# Patient Record
Sex: Female | Born: 1960 | Race: Black or African American | Hispanic: No | Smoking: Never smoker
Health system: Southern US, Community
[De-identification: ages and names within clinical notes are randomized; demographics above are authoritative.]

## PROBLEM LIST (undated history)

## (undated) DIAGNOSIS — F419 Anxiety disorder, unspecified: Secondary | ICD-10-CM

## (undated) DIAGNOSIS — H209 Unspecified iridocyclitis: Secondary | ICD-10-CM

## (undated) DIAGNOSIS — I10 Essential (primary) hypertension: Secondary | ICD-10-CM

## (undated) DIAGNOSIS — R079 Chest pain, unspecified: Secondary | ICD-10-CM

## (undated) DIAGNOSIS — F32A Depression, unspecified: Secondary | ICD-10-CM

## (undated) DIAGNOSIS — M1990 Unspecified osteoarthritis, unspecified site: Secondary | ICD-10-CM

## (undated) DIAGNOSIS — T7840XA Allergy, unspecified, initial encounter: Secondary | ICD-10-CM

## (undated) DIAGNOSIS — N979 Female infertility, unspecified: Secondary | ICD-10-CM

## (undated) DIAGNOSIS — M5030 Other cervical disc degeneration, unspecified cervical region: Secondary | ICD-10-CM

## (undated) DIAGNOSIS — M069 Rheumatoid arthritis, unspecified: Secondary | ICD-10-CM

## (undated) HISTORY — DX: Unspecified osteoarthritis, unspecified site: M19.90

## (undated) HISTORY — DX: Anxiety disorder, unspecified: F41.9

## (undated) HISTORY — DX: Unspecified iridocyclitis: H20.9

## (undated) HISTORY — PX: UPPER GASTROINTESTINAL ENDOSCOPY: SHX188

## (undated) HISTORY — DX: Female infertility, unspecified: N97.9

## (undated) HISTORY — DX: Depression, unspecified: F32.A

## (undated) HISTORY — PX: COLONOSCOPY: SHX174

## (undated) HISTORY — PX: ABDOMINAL HYSTERECTOMY: SHX81

## (undated) HISTORY — DX: Other cervical disc degeneration, unspecified cervical region: M50.30

## (undated) HISTORY — DX: Chest pain, unspecified: R07.9

## (undated) HISTORY — DX: Allergy, unspecified, initial encounter: T78.40XA

## (undated) HISTORY — DX: Essential (primary) hypertension: I10

## (undated) HISTORY — DX: Rheumatoid arthritis, unspecified: M06.9

---

## 1998-11-17 ENCOUNTER — Other Ambulatory Visit: Admission: RE | Admit: 1998-11-17 | Discharge: 1998-11-17 | Payer: Self-pay | Admitting: Obstetrics and Gynecology

## 1999-08-30 ENCOUNTER — Ambulatory Visit (HOSPITAL_COMMUNITY): Admission: RE | Admit: 1999-08-30 | Discharge: 1999-08-30 | Payer: Self-pay | Admitting: Gastroenterology

## 1999-08-31 ENCOUNTER — Ambulatory Visit (HOSPITAL_COMMUNITY): Admission: RE | Admit: 1999-08-31 | Discharge: 1999-08-31 | Payer: Self-pay | Admitting: Gastroenterology

## 1999-08-31 ENCOUNTER — Encounter: Payer: Self-pay | Admitting: Gastroenterology

## 2000-01-22 ENCOUNTER — Other Ambulatory Visit: Admission: RE | Admit: 2000-01-22 | Discharge: 2000-01-22 | Payer: Self-pay | Admitting: Obstetrics and Gynecology

## 2000-01-26 ENCOUNTER — Encounter: Payer: Self-pay | Admitting: Obstetrics and Gynecology

## 2000-01-26 ENCOUNTER — Encounter: Admission: RE | Admit: 2000-01-26 | Discharge: 2000-01-26 | Payer: Self-pay | Admitting: Obstetrics and Gynecology

## 2000-06-10 ENCOUNTER — Encounter: Payer: Self-pay | Admitting: Internal Medicine

## 2000-06-10 ENCOUNTER — Ambulatory Visit (HOSPITAL_COMMUNITY): Admission: RE | Admit: 2000-06-10 | Discharge: 2000-06-10 | Payer: Self-pay | Admitting: Internal Medicine

## 2000-07-30 HISTORY — PX: CARPAL TUNNEL RELEASE: SHX101

## 2000-07-30 HISTORY — PX: THORACIC DISC SURGERY: SHX801

## 2000-08-22 ENCOUNTER — Emergency Department (HOSPITAL_COMMUNITY): Admission: EM | Admit: 2000-08-22 | Discharge: 2000-08-22 | Payer: Self-pay | Admitting: Emergency Medicine

## 2000-08-22 ENCOUNTER — Encounter: Payer: Self-pay | Admitting: Emergency Medicine

## 2000-10-06 ENCOUNTER — Ambulatory Visit (HOSPITAL_COMMUNITY): Admission: RE | Admit: 2000-10-06 | Discharge: 2000-10-06 | Payer: Self-pay | Admitting: Neurosurgery

## 2000-10-06 ENCOUNTER — Encounter: Payer: Self-pay | Admitting: Neurosurgery

## 2000-11-15 ENCOUNTER — Inpatient Hospital Stay (HOSPITAL_COMMUNITY): Admission: RE | Admit: 2000-11-15 | Discharge: 2000-11-17 | Payer: Self-pay | Admitting: Neurosurgery

## 2000-11-15 ENCOUNTER — Encounter: Payer: Self-pay | Admitting: Neurosurgery

## 2001-01-27 ENCOUNTER — Other Ambulatory Visit: Admission: RE | Admit: 2001-01-27 | Discharge: 2001-01-27 | Payer: Self-pay | Admitting: Obstetrics and Gynecology

## 2001-02-11 ENCOUNTER — Encounter: Payer: Self-pay | Admitting: Obstetrics and Gynecology

## 2001-02-11 ENCOUNTER — Ambulatory Visit (HOSPITAL_COMMUNITY): Admission: RE | Admit: 2001-02-11 | Discharge: 2001-02-11 | Payer: Self-pay | Admitting: Obstetrics and Gynecology

## 2001-05-08 ENCOUNTER — Ambulatory Visit (HOSPITAL_COMMUNITY): Admission: RE | Admit: 2001-05-08 | Discharge: 2001-05-08 | Payer: Self-pay | Admitting: Neurosurgery

## 2001-05-08 ENCOUNTER — Encounter: Payer: Self-pay | Admitting: Neurosurgery

## 2001-05-22 ENCOUNTER — Encounter: Admission: RE | Admit: 2001-05-22 | Discharge: 2001-06-24 | Payer: Self-pay | Admitting: Neurosurgery

## 2002-01-19 ENCOUNTER — Encounter: Payer: Self-pay | Admitting: Neurosurgery

## 2002-01-19 ENCOUNTER — Encounter: Admission: RE | Admit: 2002-01-19 | Discharge: 2002-01-19 | Payer: Self-pay | Admitting: Neurosurgery

## 2002-02-02 ENCOUNTER — Encounter: Payer: Self-pay | Admitting: Neurosurgery

## 2002-02-02 ENCOUNTER — Encounter: Admission: RE | Admit: 2002-02-02 | Discharge: 2002-02-02 | Payer: Self-pay | Admitting: Neurosurgery

## 2002-07-14 ENCOUNTER — Ambulatory Visit (HOSPITAL_BASED_OUTPATIENT_CLINIC_OR_DEPARTMENT_OTHER): Admission: RE | Admit: 2002-07-14 | Discharge: 2002-07-14 | Payer: Self-pay | Admitting: Internal Medicine

## 2002-09-15 ENCOUNTER — Encounter: Payer: Self-pay | Admitting: Neurosurgery

## 2002-09-15 ENCOUNTER — Encounter: Admission: RE | Admit: 2002-09-15 | Discharge: 2002-09-15 | Payer: Self-pay | Admitting: Neurosurgery

## 2002-09-29 ENCOUNTER — Encounter: Admission: RE | Admit: 2002-09-29 | Discharge: 2002-09-29 | Payer: Self-pay | Admitting: Neurosurgery

## 2002-09-29 ENCOUNTER — Encounter: Payer: Self-pay | Admitting: Neurosurgery

## 2003-03-18 ENCOUNTER — Encounter: Payer: Self-pay | Admitting: Rheumatology

## 2003-03-18 ENCOUNTER — Encounter: Admission: RE | Admit: 2003-03-18 | Discharge: 2003-03-18 | Payer: Self-pay | Admitting: Rheumatology

## 2003-12-30 ENCOUNTER — Other Ambulatory Visit: Admission: RE | Admit: 2003-12-30 | Discharge: 2003-12-30 | Payer: Self-pay | Admitting: Obstetrics and Gynecology

## 2004-01-05 ENCOUNTER — Ambulatory Visit (HOSPITAL_COMMUNITY): Admission: RE | Admit: 2004-01-05 | Discharge: 2004-01-05 | Payer: Self-pay | Admitting: General Surgery

## 2004-05-11 ENCOUNTER — Encounter: Admission: RE | Admit: 2004-05-11 | Discharge: 2004-05-11 | Payer: Self-pay | Admitting: Neurosurgery

## 2004-05-25 ENCOUNTER — Encounter: Admission: RE | Admit: 2004-05-25 | Discharge: 2004-05-25 | Payer: Self-pay | Admitting: Neurosurgery

## 2004-06-19 ENCOUNTER — Encounter: Admission: RE | Admit: 2004-06-19 | Discharge: 2004-06-19 | Payer: Self-pay | Admitting: Neurosurgery

## 2004-07-30 HISTORY — PX: LUMBAR DISC SURGERY: SHX700

## 2004-12-20 ENCOUNTER — Ambulatory Visit (HOSPITAL_COMMUNITY): Admission: RE | Admit: 2004-12-20 | Discharge: 2004-12-20 | Payer: Self-pay | Admitting: Gynecology

## 2005-01-09 ENCOUNTER — Ambulatory Visit (HOSPITAL_COMMUNITY): Admission: RE | Admit: 2005-01-09 | Discharge: 2005-01-09 | Payer: Self-pay | Admitting: Internal Medicine

## 2005-01-31 ENCOUNTER — Encounter: Admission: RE | Admit: 2005-01-31 | Discharge: 2005-01-31 | Payer: Self-pay | Admitting: Neurosurgery

## 2005-02-20 ENCOUNTER — Encounter: Admission: RE | Admit: 2005-02-20 | Discharge: 2005-02-20 | Payer: Self-pay | Admitting: Neurosurgery

## 2005-04-19 ENCOUNTER — Other Ambulatory Visit: Admission: RE | Admit: 2005-04-19 | Discharge: 2005-04-19 | Payer: Self-pay | Admitting: Obstetrics and Gynecology

## 2005-05-11 ENCOUNTER — Encounter: Admission: RE | Admit: 2005-05-11 | Discharge: 2005-05-11 | Payer: Self-pay | Admitting: Obstetrics and Gynecology

## 2005-05-23 ENCOUNTER — Ambulatory Visit (HOSPITAL_COMMUNITY): Admission: RE | Admit: 2005-05-23 | Discharge: 2005-05-24 | Payer: Self-pay | Admitting: Neurosurgery

## 2005-06-01 ENCOUNTER — Ambulatory Visit (HOSPITAL_COMMUNITY): Admission: RE | Admit: 2005-06-01 | Discharge: 2005-06-01 | Payer: Self-pay | Admitting: Neurological Surgery

## 2005-08-14 ENCOUNTER — Encounter: Admission: RE | Admit: 2005-08-14 | Discharge: 2005-08-14 | Payer: Self-pay | Admitting: Neurosurgery

## 2006-06-19 ENCOUNTER — Other Ambulatory Visit: Admission: RE | Admit: 2006-06-19 | Discharge: 2006-06-19 | Payer: Self-pay | Admitting: Obstetrics and Gynecology

## 2006-09-04 ENCOUNTER — Ambulatory Visit (HOSPITAL_COMMUNITY): Admission: RE | Admit: 2006-09-04 | Discharge: 2006-09-04 | Payer: Self-pay | Admitting: Internal Medicine

## 2006-09-04 ENCOUNTER — Ambulatory Visit: Payer: Self-pay | Admitting: Cardiology

## 2006-09-04 ENCOUNTER — Encounter: Payer: Self-pay | Admitting: Cardiology

## 2007-08-21 ENCOUNTER — Encounter: Admission: RE | Admit: 2007-08-21 | Discharge: 2007-08-21 | Payer: Self-pay | Admitting: Obstetrics and Gynecology

## 2007-10-30 ENCOUNTER — Ambulatory Visit: Admission: RE | Admit: 2007-10-30 | Discharge: 2007-10-30 | Payer: Self-pay | Admitting: Internal Medicine

## 2008-10-21 ENCOUNTER — Encounter: Admission: RE | Admit: 2008-10-21 | Discharge: 2008-10-21 | Payer: Self-pay | Admitting: Obstetrics and Gynecology

## 2009-07-30 HISTORY — PX: LUMBAR FUSION: SHX111

## 2010-01-18 ENCOUNTER — Inpatient Hospital Stay (HOSPITAL_COMMUNITY): Admission: RE | Admit: 2010-01-18 | Discharge: 2010-01-22 | Payer: Self-pay | Admitting: Neurosurgery

## 2010-01-20 ENCOUNTER — Encounter (INDEPENDENT_AMBULATORY_CARE_PROVIDER_SITE_OTHER): Payer: Self-pay | Admitting: Internal Medicine

## 2010-01-20 ENCOUNTER — Ambulatory Visit: Payer: Self-pay | Admitting: Internal Medicine

## 2010-08-07 ENCOUNTER — Ambulatory Visit (HOSPITAL_COMMUNITY)
Admission: RE | Admit: 2010-08-07 | Discharge: 2010-08-07 | Payer: Self-pay | Source: Home / Self Care | Attending: Internal Medicine | Admitting: Internal Medicine

## 2010-08-19 ENCOUNTER — Encounter: Payer: Self-pay | Admitting: Neurosurgery

## 2010-10-15 LAB — CULTURE, BLOOD (ROUTINE X 2)
Culture: NO GROWTH
Culture: NO GROWTH

## 2010-10-15 LAB — DIFFERENTIAL
Basophils Absolute: 0 10*3/uL (ref 0.0–0.1)
Basophils Relative: 0 % (ref 0–1)
Lymphocytes Relative: 7 % — ABNORMAL LOW (ref 12–46)
Neutro Abs: 16.2 10*3/uL — ABNORMAL HIGH (ref 1.7–7.7)
Neutrophils Relative %: 87 % — ABNORMAL HIGH (ref 43–77)

## 2010-10-15 LAB — BASIC METABOLIC PANEL
BUN: 10 mg/dL (ref 6–23)
CO2: 29 mEq/L (ref 19–32)
Calcium: 9.3 mg/dL (ref 8.4–10.5)
Chloride: 103 mEq/L (ref 96–112)
Creatinine, Ser: 0.8 mg/dL (ref 0.4–1.2)
GFR calc Af Amer: >60 mL/min (ref >60)
GFR calc non Af Amer: >60 mL/min (ref >60)
Glucose, Bld: 119 mg/dL — ABNORMAL HIGH (ref 70–99)
Potassium: 3.5 mEq/L (ref 3.5–5.1)
Sodium: 137 mEq/L (ref 135–145)

## 2010-10-15 LAB — COMPREHENSIVE METABOLIC PANEL
AST: 72 U/L — ABNORMAL HIGH (ref 0–37)
Albumin: 2.7 g/dL — ABNORMAL LOW (ref 3.5–5.2)
Alkaline Phosphatase: 44 U/L (ref 39–117)
BUN: 4 mg/dL — ABNORMAL LOW (ref 6–23)
Calcium: 8.4 mg/dL (ref 8.4–10.5)
Chloride: 101 mEq/L (ref 96–112)
Chloride: 105 mEq/L (ref 96–112)
Creatinine, Ser: 0.85 mg/dL (ref 0.4–1.2)
Creatinine, Ser: 0.9 mg/dL (ref 0.4–1.2)
GFR calc Af Amer: >60 mL/min (ref >60)
Glucose, Bld: 108 mg/dL — ABNORMAL HIGH (ref 70–99)
Potassium: 3.7 mEq/L (ref 3.5–5.1)
Total Bilirubin: 0.7 mg/dL (ref 0.3–1.2)
Total Protein: 6.4 g/dL (ref 6.0–8.3)

## 2010-10-15 LAB — SURGICAL PCR SCREEN: Staphylococcus aureus: NEGATIVE

## 2010-10-15 LAB — TYPE AND SCREEN

## 2010-10-15 LAB — CK TOTAL AND CKMB (NOT AT ARMC)
CK, MB: 18 ng/mL (ref 0.3–4.0)
CK, MB: 8.7 ng/mL (ref 0.3–4.0)
Relative Index: 0.2 (ref 0.0–2.5)
Relative Index: 0.5 (ref 0.0–2.5)
Total CK: 3331 U/L — ABNORMAL HIGH (ref 7–177)

## 2010-10-15 LAB — CBC
HCT: 30.3 % — ABNORMAL LOW (ref 36.0–46.0)
MCH: 33.6 pg (ref 26.0–34.0)
MCHC: 34.2 g/dL (ref 30.0–36.0)
MCHC: 34.5 g/dL (ref 30.0–36.0)
MCHC: 34.6 g/dL (ref 30.0–36.0)
MCV: 96.5 fL (ref 78.0–100.0)
MCV: 96.7 fL (ref 78.0–100.0)
Platelets: 181 10*3/uL (ref 150–400)
Platelets: 242 10*3/uL (ref 150–400)
Platelets: 263 10*3/uL (ref 150–400)
Platelets: 321 10*3/uL (ref 150–400)
RBC: 2.66 MIL/uL — ABNORMAL LOW (ref 3.87–5.11)
RDW: 12.9 % (ref 11.5–15.5)
RDW: 13 % (ref 11.5–15.5)
WBC: 15.1 10*3/uL — ABNORMAL HIGH (ref 4.0–10.5)
WBC: 18.5 10*3/uL — ABNORMAL HIGH (ref 4.0–10.5)

## 2010-10-15 LAB — URINALYSIS, ROUTINE W REFLEX MICROSCOPIC
Bilirubin Urine: NEGATIVE
Ketones, ur: NEGATIVE mg/dL
Protein, ur: NEGATIVE mg/dL
Specific Gravity, Urine: <1.005 — ABNORMAL LOW (ref 1.005–1.030)
Urobilinogen, UA: 1 mg/dL (ref 0.0–1.0)

## 2010-10-15 LAB — TROPONIN I
Troponin I: 0.02 ng/mL (ref 0.00–0.06)
Troponin I: 0.04 ng/mL (ref 0.00–0.06)

## 2010-10-15 LAB — D-DIMER, QUANTITATIVE: D-Dimer, Quant: 1.63 ug/mL-FEU — ABNORMAL HIGH (ref 0.00–0.48)

## 2010-10-15 LAB — URINE MICROSCOPIC-ADD ON

## 2010-10-15 LAB — ABO/RH: ABO/RH(D): AB POS

## 2010-10-30 ENCOUNTER — Other Ambulatory Visit: Payer: Self-pay | Admitting: Obstetrics and Gynecology

## 2010-10-30 DIAGNOSIS — Z1231 Encounter for screening mammogram for malignant neoplasm of breast: Secondary | ICD-10-CM

## 2010-11-15 ENCOUNTER — Ambulatory Visit
Admission: RE | Admit: 2010-11-15 | Discharge: 2010-11-15 | Disposition: A | Discharge status: Home / Self Care | Payer: BC Managed Care – PPO | Source: Ambulatory Visit | Attending: Obstetrics and Gynecology | Admitting: Obstetrics and Gynecology

## 2010-11-15 DIAGNOSIS — Z1231 Encounter for screening mammogram for malignant neoplasm of breast: Secondary | ICD-10-CM

## 2010-12-14 ENCOUNTER — Ambulatory Visit (AMBULATORY_SURGERY_CENTER): Payer: BC Managed Care – PPO | Admitting: *Deleted

## 2010-12-14 ENCOUNTER — Encounter: Payer: Self-pay | Admitting: Internal Medicine

## 2010-12-14 VITALS — Ht 64.0 in | Wt 168.0 lb

## 2010-12-14 DIAGNOSIS — Z800 Family history of malignant neoplasm of digestive organs: Secondary | ICD-10-CM

## 2010-12-14 DIAGNOSIS — Z1211 Encounter for screening for malignant neoplasm of colon: Secondary | ICD-10-CM

## 2010-12-14 MED ORDER — PEG-KCL-NACL-NASULF-NA ASC-C 100 G PO SOLR: ORAL | Status: DC

## 2010-12-14 NOTE — Progress Notes (Signed)
Request for record release given to [facility].  Pt states she had a colon a [facility] about 10 years ago.

## 2010-12-15 NOTE — Op Note (Signed)
NAMELOLLY, GLAUS NO.:  [id number]   MEDICAL RECORD NO.:  [id number]          PATIENT TYPE:  OIB   LOCATION:  3019                         FACILITY:  [facility]   PHYSICIAN:  Coletta Memos, M.D.     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  She presented with weakness in the  left L5 distribution.  She has difficulty with heel-walking and in her  extensor hallucis longus.  MRI was performed and it showed narrowing in the  lateral recess at L4-5.  I recommended and she agreed to undergo an  operative decompression.   PREOPERATIVE DIAGNOSES:  1.  Herniated nucleus pulposus, left L4-5.  2.  Left L5 radiculopathy.   POSTOPERATIVE DIAGNOSES:  1.  Herniated nucleus pulposus, left L4-5.  2.  Left L5 radiculopathy.   PROCEDURE:  Left L4-5 semi-hemilaminectomy and diskectomy with  microdissection.   COMPLICATIONS:  None.   SURGEON:  Coletta Memos, M.D.   ASSESSMENT:  Kari Williams, M.D.   ESTIMATED BLOOD LOSS:  Minimal.   ANESTHESIA:  General endotracheal.   OPERATIVE NOTE:  Mrs. Kari Williams was brought to the operating room,  intubated and placed under a general anesthetic without difficulty.  She was  positioned prone on a Wilson frame.  All pressure points were properly  padded.  Her back was prepped and she was draped in a sterile fashion.  Using a preoperative localizing film, I infiltrated 10 mL  of 0.5% lidocaine  and 1:200,000 epinephrine into the lumbar region.  I opened the skin with a  #10 blade and took my initial incision down through the subcutaneous fat to  the thoracolumbar fascia.  I then exposed the lamina of L4 and of L5.  I  took another x-ray where a double-ended ganglion knife had been placed  inferior to superior lamina to expose the operative  field.  I then,  confirming my location at L4-5, proceeded with a semi-hemilaminectomy at L4  and also removed some of the lamina of L5.  I retracted the thecal sac  medially and removed more bone laterally to create more room for the left L5  nerve root.  I then opened the disk space, which was quite prominent and  clearly causing pressure on the left L5 root, and performed a diskectomy  using pituitary rongeurs, Epstein curettes and Kerrison punches.  I then  performed a very generous foraminotomy over the L5 nerve  root.  With Dr. Temple Pacini assistance, I felt that the nerve root was well  decompressed.  I then irrigated the wound.  I then closed the wound in  layered fashion using Vicryl sutures to reapproximate the thoracolumbar  fascia, subcutaneous tissue and subcuticular layer.  Dermabond was used for  a sterile dressing.           ______________________________  Coletta Memos, M.D.     KC/MEDQ  D:  05/23/2005  T:  05/24/2005  Job:  [id number]

## 2010-12-28 ENCOUNTER — Other Ambulatory Visit: Payer: Self-pay | Admitting: Internal Medicine

## 2011-09-19 ENCOUNTER — Other Ambulatory Visit: Payer: Self-pay | Admitting: Obstetrics and Gynecology

## 2011-09-19 DIAGNOSIS — Z1231 Encounter for screening mammogram for malignant neoplasm of breast: Secondary | ICD-10-CM

## 2011-11-06 ENCOUNTER — Other Ambulatory Visit: Payer: Self-pay | Admitting: Internal Medicine

## 2011-11-06 ENCOUNTER — Ambulatory Visit (HOSPITAL_COMMUNITY)
Admission: RE | Admit: 2011-11-06 | Discharge: 2011-11-06 | Disposition: A | Discharge status: Home / Self Care | Payer: BC Managed Care – PPO | Source: Ambulatory Visit | Attending: Internal Medicine | Admitting: Internal Medicine

## 2011-11-06 DIAGNOSIS — R0789 Other chest pain: Secondary | ICD-10-CM

## 2011-11-06 DIAGNOSIS — R071 Chest pain on breathing: Secondary | ICD-10-CM | POA: Insufficient documentation

## 2011-11-19 ENCOUNTER — Ambulatory Visit
Admission: RE | Admit: 2011-11-19 | Discharge: 2011-11-19 | Disposition: A | Discharge status: Home / Self Care | Payer: BC Managed Care – PPO | Source: Ambulatory Visit | Attending: Obstetrics and Gynecology | Admitting: Obstetrics and Gynecology

## 2011-11-19 ENCOUNTER — Ambulatory Visit (INDEPENDENT_AMBULATORY_CARE_PROVIDER_SITE_OTHER): Payer: BC Managed Care – PPO | Admitting: Obstetrics and Gynecology

## 2011-11-19 ENCOUNTER — Encounter: Payer: Self-pay | Admitting: Obstetrics and Gynecology

## 2011-11-19 VITALS — BP 108/62 | Resp 16 | Ht 64.0 in | Wt 173.0 lb

## 2011-11-19 DIAGNOSIS — Z1231 Encounter for screening mammogram for malignant neoplasm of breast: Secondary | ICD-10-CM

## 2011-11-19 DIAGNOSIS — R1907 Generalized intra-abdominal and pelvic swelling, mass and lump: Secondary | ICD-10-CM | POA: Insufficient documentation

## 2011-11-19 DIAGNOSIS — A630 Anogenital (venereal) warts: Secondary | ICD-10-CM

## 2011-11-19 DIAGNOSIS — Z124 Encounter for screening for malignant neoplasm of cervix: Secondary | ICD-10-CM

## 2011-11-19 DIAGNOSIS — Z01419 Encounter for gynecological examination (general) (routine) without abnormal findings: Secondary | ICD-10-CM

## 2011-11-19 DIAGNOSIS — D219 Benign neoplasm of connective and other soft tissue, unspecified: Secondary | ICD-10-CM

## 2011-11-19 DIAGNOSIS — I10 Essential (primary) hypertension: Secondary | ICD-10-CM | POA: Insufficient documentation

## 2011-11-19 DIAGNOSIS — D259 Leiomyoma of uterus, unspecified: Secondary | ICD-10-CM

## 2011-11-19 DIAGNOSIS — Z6825 Body mass index (BMI) 25.0-25.9, adult: Secondary | ICD-10-CM

## 2011-11-19 DIAGNOSIS — E663 Overweight: Secondary | ICD-10-CM

## 2011-11-19 DIAGNOSIS — Z6826 Body mass index (BMI) 26.0-26.9, adult: Secondary | ICD-10-CM | POA: Insufficient documentation

## 2011-11-19 NOTE — Progress Notes (Signed)
Addended by: Tim Lair on: 11/19/2011 02:34 PM   Modules accepted: Orders

## 2011-11-19 NOTE — Progress Notes (Addendum)
Regular Periods: no Mammogram: Pt last mammogram was 10/2010 "WNL" pt has Mammogram scheduled today.   Monthly Breast Ex.: yes Exercise: yes  Tetanus < 10 years: no Seatbelts: yes  NI. Bladder Functn.: yes Abuse at home: no  Daily BM's: yes Stressful Work: yes  Healthy Diet: yes Sigmoid-Colonoscopy: "6 years ago " pt is going to schedule appt soon.  Calcium: no Medical problems this year: NONE   LAST PAP:10/2010 'WNL"  Pt wants pap today  Contraception: Vas  Mammogram: 11/15/2010 "WNL"  PCP: Wyline Mood  PMH: No Changes  FMH: No Changes  Subjective:    Kari Williams is a 51 y.o. female No obstetric history on file. who presents for annual exam.  The patient has no complaints today. Condylomata resolved.  The following portions of the patient's history were reviewed and updated as appropriate: allergies, current medications, past family history, past medical history, past social history, past surgical history and problem list.  Review of Systems Pertinent items are noted in HPI. Gastrointestinal:No change in bowel habits, no abdominal pain, no rectal bleeding Genitourinary:negative for dysuria, frequency, hematuria, nocturia and urinary incontinence    Objective:     BP 108/62  Resp 16  Ht 5\' 4"  (1.626 m)  Wt 173 lb (78.472 kg)  BMI 29.70 kg/m2  Weight:  Wt Readings from Last 1 Encounters:  11/19/11 173 lb (78.472 kg)     BMI: Body mass index is 29.70 kg/(m^2). General Appearance: Alert, appropriate appearance for age. No acute distress HEENT: Grossly normal Neck / Thyroid: Supple, no masses, nodes or enlargement Lungs: clear to auscultation bilaterally Back: No CVA tenderness Breast Exam: No dimpling, nipple retraction or discharge. No masses or nodes. and No masses or nodes.No dimpling, nipple retraction or discharge. Cardiovascular: Regular rate and rhythm. S1, S2, no murmur Gastrointestinal: Soft, non-tender, no masses or organomegaly Pelvic Exam:  External genitalia: normal general appearance Vaginal: normal mucosa without prolapse or lesions and cystocele present,  Cervix: normal appearance Adnexa: normal bimanual exam Uterus: irregular enlargement Rectovaginal: mass 3 cm(? Stool) Lymphatic Exam: Non-palpable nodes in neck, clavicular, axillary, or inguinal regions Skin: no rash or abnormalities Neurologic: Normal gait and speech, no tremor  Psychiatric: Alert and oriented, appropriate affect.    Urinalysis:Not done      Assessment:    Normal gyn exam Pelvic mass vs. stool in colon.  Resolved condylomata. Improved menopausal sx   Plan:    Mammogram. Pap smear. clean colon and repeat exam in one week.  Exercise and weight control discussed.  Follow-up:  1 week.  AVS

## 2011-11-20 LAB — PAP IG W/ RFLX HPV ASCU

## 2011-11-30 ENCOUNTER — Encounter: Payer: BC Managed Care – PPO | Admitting: Obstetrics and Gynecology

## 2012-01-01 ENCOUNTER — Encounter: Payer: Self-pay | Admitting: Obstetrics and Gynecology

## 2012-04-18 ENCOUNTER — Other Ambulatory Visit: Payer: Self-pay | Admitting: Neurosurgery

## 2012-04-18 DIAGNOSIS — K4090 Unilateral inguinal hernia, without obstruction or gangrene, not specified as recurrent: Secondary | ICD-10-CM

## 2012-04-23 ENCOUNTER — Ambulatory Visit
Admission: RE | Admit: 2012-04-23 | Discharge: 2012-04-23 | Disposition: A | Discharge status: Home / Self Care | Payer: BC Managed Care – PPO | Source: Ambulatory Visit | Attending: Neurosurgery | Admitting: Neurosurgery

## 2012-04-23 DIAGNOSIS — K4090 Unilateral inguinal hernia, without obstruction or gangrene, not specified as recurrent: Secondary | ICD-10-CM

## 2012-04-23 MED ORDER — IOHEXOL 300 MG/ML  SOLN
100.0000 mL | Freq: Once | INTRAMUSCULAR | Status: AC | PRN
  Administered 2012-04-23: 100 mL via INTRAVENOUS

## 2012-05-13 ENCOUNTER — Encounter: Payer: Self-pay | Admitting: Obstetrics and Gynecology

## 2012-05-13 ENCOUNTER — Ambulatory Visit (INDEPENDENT_AMBULATORY_CARE_PROVIDER_SITE_OTHER): Payer: BC Managed Care – PPO | Admitting: Obstetrics and Gynecology

## 2012-05-13 VITALS — BP 134/86 | Temp 98.5°F | Resp 18 | Wt 175.0 lb

## 2012-05-13 DIAGNOSIS — D259 Leiomyoma of uterus, unspecified: Secondary | ICD-10-CM

## 2012-05-13 DIAGNOSIS — M549 Dorsalgia, unspecified: Secondary | ICD-10-CM

## 2012-05-13 DIAGNOSIS — R109 Unspecified abdominal pain: Secondary | ICD-10-CM

## 2012-05-13 DIAGNOSIS — D219 Benign neoplasm of connective and other soft tissue, unspecified: Secondary | ICD-10-CM

## 2012-05-13 NOTE — Progress Notes (Signed)
HISTORY OF PRESENT ILLNESS  Ms. Kari Williams is a 51 y.o. year old female,G7P2, who presents for a problem visit. The patient has a history of lower back and abdominal pain.  She has a known history of fibroid uterus.  She is postmenopausal.  She has been seen by a neurosurgeon and a CT scan was ordered recently. Fibroids were noted and the uterus but no dimensions were given.   Subjective:  The patient complains of a constant pain in her right lower back and radiates to her right flank and into her right lower pelvic area.  Objective:  BP 134/86  Temp 98.5 F (36.9 C) (Oral)  Resp 18  Wt 175 lb (79.379 kg)   GI: soft and nontender  External genitalia: normal general appearance Vaginal: normal without tenderness, induration or masses and relaxation noted Cervix: normal appearance Adnexa: normal bimanual exam Uterus: a 10 week size, irregular, minimally tender  Assessment:  Fibroid uterus Lower back, right flank, and right lower pelvic pain of uncertain etiology Minimal disc disease in her back  Plan:  A long discussion was held with the patient and her husband.  They were told that I do not believe that her fibroids are causing her discomfort.  However, it is possible that the fibroids are the culprit.  The patient will speak with her neurosurgeon and her family physician about other possible etiologies and treatment.  If nothing else release her discomfort, then hysterectomy may be appropriate.  Other fibroid treatments were reviewed with the patient understands that she will still have fibroids.  Return to office prn if symptoms worsen or fail to improve.   Leonard Schwartz M.D.  05/13/2012 9:12 PM    Vag. Discharge:no Odor:no Fever:no Irreg.Periods:no Dyspareunia:no Dysuria:yes Frequency:no Urgency:no Hematuria:no Kidney stones:no Constipation:no Diarrhea:no Rectal Bleeding:  no Vomiting:no Nausea:no Pregnant:no Fibroids:yes Endometriosis:no Hx of Ovarian Cyst:no Hx IUD:yes Hx STD-PID:no Appendectomy:no Gall Bladder Dz:no

## 2012-06-03 ENCOUNTER — Telehealth: Payer: Self-pay | Admitting: Obstetrics and Gynecology

## 2012-06-23 ENCOUNTER — Encounter (HOSPITAL_COMMUNITY): Payer: Self-pay | Admitting: Pharmacist

## 2012-06-23 ENCOUNTER — Other Ambulatory Visit: Payer: Self-pay | Admitting: Obstetrics and Gynecology

## 2012-06-23 ENCOUNTER — Telehealth: Payer: Self-pay | Admitting: Obstetrics and Gynecology

## 2012-06-23 NOTE — Telephone Encounter (Signed)
Vaginal Hysterectomy scheduled for 07/02/12 @ 10:00 with AVS/SR.  [employer] effective 07/31/11.  Plan pays 100% after a $200 deducible. -Adrianne Pridgen

## 2012-06-25 ENCOUNTER — Other Ambulatory Visit: Payer: Self-pay

## 2012-06-25 ENCOUNTER — Encounter (HOSPITAL_COMMUNITY): Payer: Self-pay

## 2012-06-25 ENCOUNTER — Ambulatory Visit (INDEPENDENT_AMBULATORY_CARE_PROVIDER_SITE_OTHER): Payer: BC Managed Care – PPO | Admitting: Obstetrics and Gynecology

## 2012-06-25 ENCOUNTER — Encounter: Payer: Self-pay | Admitting: Obstetrics and Gynecology

## 2012-06-25 ENCOUNTER — Encounter (HOSPITAL_COMMUNITY)
Admission: RE | Admit: 2012-06-25 | Discharge: 2012-06-25 | Disposition: A | Discharge status: Home / Self Care | Payer: BC Managed Care – PPO | Source: Ambulatory Visit | Attending: Obstetrics and Gynecology | Admitting: Obstetrics and Gynecology

## 2012-06-25 VITALS — BP 106/70 | Wt 173.0 lb

## 2012-06-25 DIAGNOSIS — N9489 Other specified conditions associated with female genital organs and menstrual cycle: Secondary | ICD-10-CM

## 2012-06-25 DIAGNOSIS — D219 Benign neoplasm of connective and other soft tissue, unspecified: Secondary | ICD-10-CM

## 2012-06-25 DIAGNOSIS — D259 Leiomyoma of uterus, unspecified: Secondary | ICD-10-CM

## 2012-06-25 DIAGNOSIS — R102 Pelvic and perineal pain: Secondary | ICD-10-CM

## 2012-06-25 LAB — BASIC METABOLIC PANEL
Calcium: 9.1 mg/dL (ref 8.4–10.5)
GFR calc Af Amer: 87 mL/min — ABNORMAL LOW (ref >90)
GFR calc non Af Amer: 75 mL/min — ABNORMAL LOW (ref >90)
Sodium: 137 mEq/L (ref 135–145)

## 2012-06-25 LAB — CBC
Platelets: 345 10*3/uL (ref 150–400)
RBC: 3.9 MIL/uL (ref 3.87–5.11)
WBC: 8.3 10*3/uL (ref 4.0–10.5)

## 2012-06-25 LAB — SURGICAL PCR SCREEN: Staphylococcus aureus: NEGATIVE

## 2012-06-25 NOTE — Progress Notes (Signed)
HISTORY OF PRESENT ILLNESS  Ms. Kari Williams is a 51 y.o. year old female,G7P2, who presents for a problem visit. She has a known fibroid.  She wants to proceed with a vaginal hysterectomy.  Subjective:  She complains of pelvic pressure symptoms.  Objective:  BP 106/70  Wt 173 lb (78.472 kg)   General: no distress Resp: clear to auscultation bilaterally Cardio: regular rate and rhythm, S1, S2 normal, no murmur, click, rub or gallop GI: soft and nontender  External genitalia: normal general appearance Vaginal: normal without tenderness, induration or masses and relaxation noted Cervix: normal appearance Adnexa: normal bimanual exam Uterus: a 10 week size, irregular  ENDOMETRIAL BIOPSY PROCEDURE  The indications for the procedure were reviewed with the patient.  All her questions were answered.  A speculum exam was performed.  The cervix and vagina were prepped with multiple layers of Betadine.  Hurricaine gel was applied to the cervix.  A single-tooth tenaculum was required.  The uterus sounded to 7 centimeters.  An adequate sample was obtained.  The patient tolerated her procedure well.  The endometrial sample was sent to pathology.   Assessment:  Fibroid uterus  Pelvic pressure symptoms  Obesity  Hypertension  Plan:  Hysterectomy forms reviewed and signed.  Endometrial biopsy to pathology.  Return to office for ultrasound.  Return to office in 5 day(s).  Leonard Schwartz M.D.  06/25/2012 6:11 PM

## 2012-06-25 NOTE — Patient Instructions (Addendum)
   Your procedure is scheduled on:Wednesday December 4th  Enter through the [employer] [facility] at:8:30am Pick up the phone at the desk and dial [phone] and inform us of your arrival.  Please call this number if you have any problems the morning of surgery: [phone]  Remember: Do not eat or drink anything after midnight on Tuesday Please take your medications morning of surgery with sips of water  Do not wear jewelry, make-up, or FINGER nail polish No metal in your hair or on your body. Do not wear lotions, powders, perfumes. You may wear deodorant.  Please use your CHG wash as directed prior to surgery.  Do not shave anywhere for at least 12 hours prior to first CHG shower.  Do not bring valuables to the hospital. Contacts, dentures or bridgework may not be worn into surgery.  Leave suitcase in the car. After Surgery it may be brought to your room. For patients being admitted to the hospital, checkout time is 11:00am the day of discharge.

## 2012-06-30 ENCOUNTER — Encounter: Payer: Self-pay | Admitting: Obstetrics and Gynecology

## 2012-06-30 ENCOUNTER — Ambulatory Visit (INDEPENDENT_AMBULATORY_CARE_PROVIDER_SITE_OTHER): Payer: BC Managed Care – PPO

## 2012-06-30 ENCOUNTER — Ambulatory Visit (INDEPENDENT_AMBULATORY_CARE_PROVIDER_SITE_OTHER): Payer: BC Managed Care – PPO | Admitting: Obstetrics and Gynecology

## 2012-06-30 ENCOUNTER — Other Ambulatory Visit: Payer: Self-pay | Admitting: Obstetrics and Gynecology

## 2012-06-30 VITALS — BP 110/74 | HR 74 | Wt 175.0 lb

## 2012-06-30 DIAGNOSIS — D219 Benign neoplasm of connective and other soft tissue, unspecified: Secondary | ICD-10-CM

## 2012-06-30 DIAGNOSIS — R102 Pelvic and perineal pain: Secondary | ICD-10-CM

## 2012-06-30 DIAGNOSIS — D259 Leiomyoma of uterus, unspecified: Secondary | ICD-10-CM

## 2012-06-30 DIAGNOSIS — N9489 Other specified conditions associated with female genital organs and menstrual cycle: Secondary | ICD-10-CM

## 2012-06-30 LAB — PATHOLOGY

## 2012-06-30 NOTE — Progress Notes (Signed)
HISTORY OF PRESENT ILLNESS   Ms. Kari Williams is a 51 y.o. year old female,G7P2, who presents for a problem visit. Patient has pelvic and abdominal pressure symptoms.  She has known fibroids.  She is scheduled for vaginal hysterectomy.  Subjective:  The patient feels anxious about her surgery.  She complains of pelvic heaviness and pressure.  She understands that we cannot guarantee relief of her discomfort.  Objective:  BP 110/74  Pulse 74  Wt 175 lb (79.379 kg)   General: no distress  Exam deferred.  Ultrasound: Uterus measures 8.02 cm x 6.10 cm.  3 fibroids are noted with the largest fibroid measuring 5.46 cm.  The ovaries appeared normal.  Assessment:  Fibroids  Pelvic pressure symptoms  Plan:  The surgery was again reviewed.  Vaginal hysterectomy is scheduled.  We will check the endometrial biopsy results.  Leonard Schwartz M.D.  06/30/2012 9:28 AM

## 2012-06-30 NOTE — Progress Notes (Signed)
Pt is here today for a follow up u/s for pelvic pain

## 2012-07-01 MED ORDER — GENTAMICIN SULFATE 40 MG/ML IJ SOLN
INTRAVENOUS | Status: AC
  Administered 2012-07-02: 323.2 mL via INTRAVENOUS
  Filled 2012-07-01: qty 8.08

## 2012-07-01 NOTE — H&P (Signed)
Admission History and Physical Exam for a Gynecology Patient  Kari Williams is a 51 y.o. female, G7P2, who presents for a vaginal hysterectomy. She has been followed at the [facility] of [facility]. See history below. The patient complains of pelvic pressure symptoms and low back pain.  She has been evaluated by her family physician and other medical specialist.  A CT scan showed fibroids. An ultrasound showed an 8 x 6 cm uterus with multiple fibroids.  Benign endocervical cells and blood clots were noted on endometrial biopsy.  The endometrial thickness was 4.41 cm.  The ovaries appeared normal.  The patient's discomfort has not responded to other treatment modalities including multiple medications.  She is ready to proceed with definitive therapy for her fibroids. Her husband has had a vasectomy. She is postmenopausal. She denies postmenopausal bleeding.   OB History    Grav Para Term Preterm Abortions TAB SAB Ect Mult Living   7 2        2       Past Medical History  Diagnosis Date  . Allergy     seasonal  . Arthritis     osteoarthritis/ddd  . Hypertension     No prescriptions prior to admission    Past Surgical History  Procedure Date  . Colonoscopy   . Upper gastrointestinal endoscopy   . Carpal tunnel release 2002    right  . Thoracic disc surgery 2002  . Lumbar disc surgery 2006  . Lumbar fusion 2011    Allergies  Allergen Reactions  . Penicillins Hives    Family History: family history includes Colon cancer in her brother.  Social History:  reports that she has never smoked. She has never used smokeless tobacco. She reports that she drinks about 1.2 ounces of alcohol per week. She reports that she does not use illicit drugs.  Review of systems: Normal pregnancy complaints.  Admission Physical Exam:    Body mass index is 30.04 kg/(m^2).  Height 5\' 4"  (1.626 m), weight 175 lb (79.379  kg).  HEENT:                 Within normal limits Chest:                   Clear Heart:                    Regular rate and rhythm Breasts:                No masses, skin changes, bleeding, or discharge present Abdomen:             Nontender, no masses Extremities:          Grossly normal Neurologic exam: Grossly normal  Pelvic exam:  External genitalia: normal general appearance Vaginal: normal without tenderness, induration or masses and relaxation noted Cervix: normal appearance Adnexa: normal bimanual exam Uterus: 10 week size, irregular, firm Rectal: good sphincter tone and no masses  CBC    Component Value Date/Time   WBC 8.3 06/25/2012 1436   RBC 3.90 06/25/2012 1436   HGB 12.4 06/25/2012 1436   HCT 36.4 06/25/2012 1436   PLT 345 06/25/2012 1436   MCV 93.3 06/25/2012 1436   MCH 31.8 06/25/2012 1436   MCHC 34.1 06/25/2012 1436   RDW 12.3 06/25/2012 1436   LYMPHSABS 1.3 01/20/2010 0530   MONOABS 1.1* 01/20/2010 0530   EOSABS 0.0 01/20/2010 0530  BASOSABS 0.0 01/20/2010 0530      Assessment:  Fibroid uterus  Pelvic pressure symptoms  Low back pain  Hypertension  Obesity  Arthritis   Plan:  A vaginal hysterectomy and bilateral salpingectomy are scheduled.  The patient understands the indications for surgical procedure.  She accepts the risk of, but not limited to, anesthetic applications, bleeding, infections, and possible damage to surrounding organs.  She understands that no guarantees can be given concerning the total relief of her discomfort.   Carlise Stofer V 07/01/2012, 7:55 PM

## 2012-07-02 ENCOUNTER — Ambulatory Visit (HOSPITAL_COMMUNITY): Payer: BC Managed Care – PPO | Admitting: Anesthesiology

## 2012-07-02 ENCOUNTER — Encounter (HOSPITAL_COMMUNITY): Payer: Self-pay | Admitting: *Deleted

## 2012-07-02 ENCOUNTER — Encounter (HOSPITAL_COMMUNITY): Payer: Self-pay | Admitting: Anesthesiology

## 2012-07-02 ENCOUNTER — Ambulatory Visit (HOSPITAL_COMMUNITY)
Admission: RE | Admit: 2012-07-02 | Discharge: 2012-07-03 | Disposition: A | Discharge status: Home / Self Care | Payer: BC Managed Care – PPO | Source: Ambulatory Visit | Attending: Obstetrics and Gynecology | Admitting: Obstetrics and Gynecology

## 2012-07-02 ENCOUNTER — Encounter (HOSPITAL_COMMUNITY)
Admission: RE | Disposition: A | Discharge status: Home / Self Care | Payer: Self-pay | Source: Ambulatory Visit | Attending: Obstetrics and Gynecology

## 2012-07-02 DIAGNOSIS — N949 Unspecified condition associated with female genital organs and menstrual cycle: Secondary | ICD-10-CM | POA: Insufficient documentation

## 2012-07-02 DIAGNOSIS — D252 Subserosal leiomyoma of uterus: Secondary | ICD-10-CM | POA: Insufficient documentation

## 2012-07-02 DIAGNOSIS — D251 Intramural leiomyoma of uterus: Secondary | ICD-10-CM | POA: Insufficient documentation

## 2012-07-02 DIAGNOSIS — M545 Low back pain, unspecified: Secondary | ICD-10-CM | POA: Insufficient documentation

## 2012-07-02 DIAGNOSIS — D259 Leiomyoma of uterus, unspecified: Secondary | ICD-10-CM

## 2012-07-02 DIAGNOSIS — I10 Essential (primary) hypertension: Secondary | ICD-10-CM | POA: Insufficient documentation

## 2012-07-02 DIAGNOSIS — N8000 Endometriosis of the uterus, unspecified: Secondary | ICD-10-CM | POA: Insufficient documentation

## 2012-07-02 DIAGNOSIS — E669 Obesity, unspecified: Secondary | ICD-10-CM | POA: Insufficient documentation

## 2012-07-02 DIAGNOSIS — Z01419 Encounter for gynecological examination (general) (routine) without abnormal findings: Secondary | ICD-10-CM

## 2012-07-02 HISTORY — PX: TUBAL LIGATION: SHX77

## 2012-07-02 HISTORY — PX: VAGINAL HYSTERECTOMY: SHX2639

## 2012-07-02 LAB — PREGNANCY, URINE: Preg Test, Ur: NEGATIVE

## 2012-07-02 SURGERY — HYSTERECTOMY, VAGINAL
Anesthesia: General | Wound class: Clean Contaminated

## 2012-07-02 MED ORDER — OXYCODONE-ACETAMINOPHEN 5-325 MG PO TABS
1.0000 | ORAL_TABLET | ORAL | Status: DC | PRN
  Administered 2012-07-03: 2 via ORAL
  Filled 2012-07-02: qty 2

## 2012-07-02 MED ORDER — CLINDAMYCIN PHOSPHATE 900 MG/50ML IV SOLN
INTRAVENOUS | Status: DC | PRN
  Administered 2012-07-02: 900 mg via INTRAVENOUS

## 2012-07-02 MED ORDER — KETOROLAC TROMETHAMINE 30 MG/ML IJ SOLN
INTRAMUSCULAR | Status: DC | PRN
  Administered 2012-07-02 (×2): 30 mg via INTRAVENOUS

## 2012-07-02 MED ORDER — EPHEDRINE 5 MG/ML INJ
INTRAVENOUS | Status: AC
  Filled 2012-07-02: qty 10

## 2012-07-02 MED ORDER — MIDAZOLAM HCL 2 MG/2ML IJ SOLN
INTRAMUSCULAR | Status: AC
  Filled 2012-07-02: qty 2

## 2012-07-02 MED ORDER — ONDANSETRON HCL 4 MG/2ML IJ SOLN: 4.0000 mg | Freq: Four times a day (QID) | INTRAMUSCULAR | Status: DC | PRN

## 2012-07-02 MED ORDER — KETOROLAC TROMETHAMINE 30 MG/ML IJ SOLN: 15.0000 mg | Freq: Once | INTRAMUSCULAR | Status: DC | PRN

## 2012-07-02 MED ORDER — ROCURONIUM BROMIDE 50 MG/5ML IV SOLN
INTRAVENOUS | Status: AC
  Filled 2012-07-02: qty 1

## 2012-07-02 MED ORDER — ROCURONIUM BROMIDE 100 MG/10ML IV SOLN
INTRAVENOUS | Status: DC | PRN
  Administered 2012-07-02: 35 mg via INTRAVENOUS

## 2012-07-02 MED ORDER — IBUPROFEN 800 MG PO TABS
800.0000 mg | ORAL_TABLET | Freq: Three times a day (TID) | ORAL | Status: DC | PRN
  Administered 2012-07-03: 800 mg via ORAL
  Filled 2012-07-02: qty 1

## 2012-07-02 MED ORDER — DEXAMETHASONE SODIUM PHOSPHATE 10 MG/ML IJ SOLN
INTRAMUSCULAR | Status: AC
  Filled 2012-07-02: qty 1

## 2012-07-02 MED ORDER — ONDANSETRON HCL 4 MG/2ML IJ SOLN
INTRAMUSCULAR | Status: AC
  Filled 2012-07-02: qty 2

## 2012-07-02 MED ORDER — PROPOFOL 10 MG/ML IV EMUL
INTRAVENOUS | Status: DC | PRN
  Administered 2012-07-02: 150 mg via INTRAVENOUS

## 2012-07-02 MED ORDER — HYDROMORPHONE HCL PF 1 MG/ML IJ SOLN
INTRAMUSCULAR | Status: AC
  Filled 2012-07-02: qty 1

## 2012-07-02 MED ORDER — IRBESARTAN 150 MG PO TABS
150.0000 mg | ORAL_TABLET | Freq: Every day | ORAL | Status: DC
  Administered 2012-07-03: 150 mg via ORAL
  Filled 2012-07-02 (×2): qty 1

## 2012-07-02 MED ORDER — LIDOCAINE HCL (CARDIAC) 20 MG/ML IV SOLN
INTRAVENOUS | Status: DC | PRN
  Administered 2012-07-02: 30 mg via INTRAVENOUS
  Administered 2012-07-02: 40 mg via INTRAVENOUS
  Administered 2012-07-02: 30 mg via INTRAVENOUS

## 2012-07-02 MED ORDER — DIPHENHYDRAMINE HCL 50 MG/ML IJ SOLN: 12.5000 mg | Freq: Four times a day (QID) | INTRAMUSCULAR | Status: DC | PRN

## 2012-07-02 MED ORDER — PHENYLEPHRINE 40 MCG/ML (10ML) SYRINGE FOR IV PUSH (FOR BLOOD PRESSURE SUPPORT)
PREFILLED_SYRINGE | INTRAVENOUS | Status: AC
  Filled 2012-07-02: qty 10

## 2012-07-02 MED ORDER — NALOXONE HCL 0.4 MG/ML IJ SOLN: 0.4000 mg | INTRAMUSCULAR | Status: DC | PRN

## 2012-07-02 MED ORDER — NEOSTIGMINE METHYLSULFATE 1 MG/ML IJ SOLN
INTRAMUSCULAR | Status: AC
  Filled 2012-07-02: qty 10

## 2012-07-02 MED ORDER — ONDANSETRON HCL 4 MG/2ML IJ SOLN
4.0000 mg | Freq: Four times a day (QID) | INTRAMUSCULAR | Status: DC | PRN
  Administered 2012-07-02 – 2012-07-03 (×2): 4 mg via INTRAVENOUS
  Filled 2012-07-02: qty 2

## 2012-07-02 MED ORDER — PHENYLEPHRINE 40 MCG/ML (10ML) SYRINGE FOR IV PUSH (FOR BLOOD PRESSURE SUPPORT)
PREFILLED_SYRINGE | INTRAVENOUS | Status: AC
  Filled 2012-07-02: qty 5

## 2012-07-02 MED ORDER — HYDROMORPHONE HCL PF 1 MG/ML IJ SOLN
0.2500 mg | INTRAMUSCULAR | Status: DC | PRN
  Administered 2012-07-02 (×4): 0.5 mg via INTRAVENOUS

## 2012-07-02 MED ORDER — PROPOFOL 10 MG/ML IV EMUL
INTRAVENOUS | Status: AC
  Filled 2012-07-02: qty 20

## 2012-07-02 MED ORDER — FENTANYL CITRATE 0.05 MG/ML IJ SOLN
INTRAMUSCULAR | Status: AC
  Filled 2012-07-02: qty 5

## 2012-07-02 MED ORDER — NEOSTIGMINE METHYLSULFATE 1 MG/ML IJ SOLN
INTRAMUSCULAR | Status: DC | PRN
  Administered 2012-07-02: 3 mg via INTRAVENOUS

## 2012-07-02 MED ORDER — HYDROMORPHONE 0.3 MG/ML IV SOLN
INTRAVENOUS | Status: DC
  Administered 2012-07-02: 2.4 mg via INTRAVENOUS
  Administered 2012-07-02: 13:00:00 via INTRAVENOUS
  Administered 2012-07-02: 3 mg via INTRAVENOUS
  Administered 2012-07-03: 1.9 mg via INTRAVENOUS
  Administered 2012-07-03: 1.8 mg via INTRAVENOUS
  Administered 2012-07-03: 3.6 mg via INTRAVENOUS
  Filled 2012-07-02 (×2): qty 25

## 2012-07-02 MED ORDER — DEXAMETHASONE SODIUM PHOSPHATE 4 MG/ML IJ SOLN
INTRAMUSCULAR | Status: DC | PRN
  Administered 2012-07-02: 10 mg via INTRAVENOUS

## 2012-07-02 MED ORDER — SIMETHICONE 80 MG PO CHEW: 80.0000 mg | CHEWABLE_TABLET | Freq: Four times a day (QID) | ORAL | Status: DC | PRN

## 2012-07-02 MED ORDER — GLYCOPYRROLATE 0.2 MG/ML IJ SOLN
INTRAMUSCULAR | Status: DC | PRN
  Administered 2012-07-02: 0.4 mg via INTRAVENOUS

## 2012-07-02 MED ORDER — EPHEDRINE SULFATE 50 MG/ML IJ SOLN
INTRAMUSCULAR | Status: DC | PRN
  Administered 2012-07-02 (×5): 5 mg via INTRAVENOUS

## 2012-07-02 MED ORDER — DOCUSATE SODIUM 100 MG PO CAPS
100.0000 mg | ORAL_CAPSULE | Freq: Two times a day (BID) | ORAL | Status: DC
  Administered 2012-07-03: 100 mg via ORAL
  Filled 2012-07-02: qty 1

## 2012-07-02 MED ORDER — DEXTROSE IN LACTATED RINGERS 5 % IV SOLN
INTRAVENOUS | Status: DC
  Administered 2012-07-02 – 2012-07-03 (×2): via INTRAVENOUS

## 2012-07-02 MED ORDER — LIDOCAINE HCL (CARDIAC) 20 MG/ML IV SOLN
INTRAVENOUS | Status: AC
  Filled 2012-07-02: qty 5

## 2012-07-02 MED ORDER — BUPIVACAINE-EPINEPHRINE (PF) 0.5% -1:200000 IJ SOLN
INTRAMUSCULAR | Status: AC
  Filled 2012-07-02: qty 10

## 2012-07-02 MED ORDER — KETOROLAC TROMETHAMINE 30 MG/ML IJ SOLN: 30.0000 mg | Freq: Four times a day (QID) | INTRAMUSCULAR | Status: DC

## 2012-07-02 MED ORDER — FENTANYL CITRATE 0.05 MG/ML IJ SOLN
INTRAMUSCULAR | Status: DC | PRN
  Administered 2012-07-02: 25 ug via INTRAVENOUS
  Administered 2012-07-02 (×3): 50 ug via INTRAVENOUS

## 2012-07-02 MED ORDER — SODIUM CHLORIDE 0.9 % IJ SOLN: 9.0000 mL | INTRAMUSCULAR | Status: DC | PRN

## 2012-07-02 MED ORDER — BUPIVACAINE-EPINEPHRINE 0.5% -1:200000 IJ SOLN
INTRAMUSCULAR | Status: DC | PRN
  Administered 2012-07-02: 20 mL

## 2012-07-02 MED ORDER — VASOPRESSIN 20 UNIT/ML IJ SOLN
INTRAMUSCULAR | Status: AC
  Filled 2012-07-02: qty 1

## 2012-07-02 MED ORDER — HYDROMORPHONE HCL PF 1 MG/ML IJ SOLN
INTRAMUSCULAR | Status: AC
  Administered 2012-07-02: 0.5 mg via INTRAVENOUS
  Filled 2012-07-02: qty 1

## 2012-07-02 MED ORDER — LACTATED RINGERS IV SOLN
INTRAVENOUS | Status: DC
  Administered 2012-07-02 (×3): via INTRAVENOUS

## 2012-07-02 MED ORDER — AMLODIPINE BESYLATE-VALSARTAN 10-160 MG PO TABS: 1.0000 | ORAL_TABLET | Freq: Every day | ORAL | Status: DC

## 2012-07-02 MED ORDER — ONDANSETRON HCL 4 MG PO TABS: 4.0000 mg | ORAL_TABLET | Freq: Four times a day (QID) | ORAL | Status: DC | PRN

## 2012-07-02 MED ORDER — TORSEMIDE 20 MG PO TABS
20.0000 mg | ORAL_TABLET | Freq: Every day | ORAL | Status: DC
  Administered 2012-07-03: 20 mg via ORAL
  Filled 2012-07-02 (×2): qty 1

## 2012-07-02 MED ORDER — PHENYLEPHRINE HCL 10 MG/ML IJ SOLN
INTRAMUSCULAR | Status: DC | PRN
  Administered 2012-07-02: 40 ug via INTRAVENOUS
  Administered 2012-07-02 (×3): 80 ug via INTRAVENOUS
  Administered 2012-07-02: 40 ug via INTRAVENOUS
  Administered 2012-07-02: 80 ug via INTRAVENOUS
  Administered 2012-07-02: 40 ug via INTRAVENOUS
  Administered 2012-07-02: 120 ug via INTRAVENOUS

## 2012-07-02 MED ORDER — DIPHENHYDRAMINE HCL 12.5 MG/5ML PO ELIX: 12.5000 mg | ORAL_SOLUTION | Freq: Four times a day (QID) | ORAL | Status: DC | PRN

## 2012-07-02 MED ORDER — ONDANSETRON HCL 4 MG/2ML IJ SOLN
INTRAMUSCULAR | Status: DC | PRN
  Administered 2012-07-02: 4 mg via INTRAVENOUS

## 2012-07-02 MED ORDER — GLYCOPYRROLATE 0.2 MG/ML IJ SOLN
INTRAMUSCULAR | Status: AC
  Filled 2012-07-02: qty 2

## 2012-07-02 MED ORDER — MIDAZOLAM HCL 5 MG/5ML IJ SOLN
INTRAMUSCULAR | Status: DC | PRN
  Administered 2012-07-02: 2 mg via INTRAVENOUS

## 2012-07-02 MED ORDER — AMLODIPINE BESYLATE 10 MG PO TABS
10.0000 mg | ORAL_TABLET | Freq: Every day | ORAL | Status: DC
  Administered 2012-07-03: 10 mg via ORAL
  Filled 2012-07-02 (×2): qty 1

## 2012-07-02 MED ORDER — KETOROLAC TROMETHAMINE 30 MG/ML IJ SOLN
INTRAMUSCULAR | Status: AC
  Filled 2012-07-02: qty 2

## 2012-07-02 MED ORDER — KETOROLAC TROMETHAMINE 30 MG/ML IJ SOLN
30.0000 mg | Freq: Four times a day (QID) | INTRAMUSCULAR | Status: DC
  Administered 2012-07-02 – 2012-07-03 (×3): 30 mg via INTRAVENOUS
  Filled 2012-07-02 (×3): qty 1

## 2012-07-02 SURGICAL SUPPLY — 29 items
CANISTER SUCTION 2500CC (MISCELLANEOUS) ×3 IMPLANT
CLOTH BEACON ORANGE TIMEOUT ST (SAFETY) ×3 IMPLANT
CONT PATH 16OZ SNAP LID 3702 (MISCELLANEOUS) IMPLANT
DECANTER SPIKE VIAL GLASS SM (MISCELLANEOUS) IMPLANT
DRAPE PROXIMA HALF (DRAPES) ×3 IMPLANT
DRESSING TELFA 8X3 (GAUZE/BANDAGES/DRESSINGS) ×3 IMPLANT
GLOVE BIOGEL PI IND STRL 6.5 (GLOVE) ×2 IMPLANT
GLOVE BIOGEL PI IND STRL 8.5 (GLOVE) ×2 IMPLANT
GLOVE BIOGEL PI INDICATOR 6.5 (GLOVE) ×1
GLOVE BIOGEL PI INDICATOR 8.5 (GLOVE) ×1
GLOVE ECLIPSE 8.0 STRL XLNG CF (GLOVE) ×6 IMPLANT
GOWN STRL REIN XL XLG (GOWN DISPOSABLE) ×12 IMPLANT
NDL SPNL 22GX3.5 QUINCKE BK (NEEDLE) IMPLANT
NEEDLE HYPO 22GX1.5 SAFETY (NEEDLE) IMPLANT
NEEDLE MAYO .5 CIRCLE (NEEDLE) ×3 IMPLANT
NEEDLE SPNL 22GX3.5 QUINCKE BK (NEEDLE) IMPLANT
NS IRRIG 1000ML POUR BTL (IV SOLUTION) ×3 IMPLANT
PACK VAGINAL WOMENS (CUSTOM PROCEDURE TRAY) ×3 IMPLANT
PAD OB MATERNITY 4.3X12.25 (PERSONAL CARE ITEMS) ×3 IMPLANT
SUT CHROMIC 2 0 TIES 18 (SUTURE) IMPLANT
SUT VIC AB 0 CT1 18XCR BRD8 (SUTURE) ×6 IMPLANT
SUT VIC AB 0 CT1 27 (SUTURE) ×3
SUT VIC AB 0 CT1 27XBRD ANBCTR (SUTURE) ×2 IMPLANT
SUT VIC AB 0 CT1 8-18 (SUTURE) ×9
SUT VICRYL 0 TIES 12 18 (SUTURE) ×3 IMPLANT
SYR TB 1ML 25GX5/8 (SYRINGE) ×3 IMPLANT
TOWEL OR 17X24 6PK STRL BLUE (TOWEL DISPOSABLE) ×6 IMPLANT
TRAY FOLEY CATH 14FR (SET/KITS/TRAYS/PACK) ×3 IMPLANT
WATER STERILE IRR 1000ML POUR (IV SOLUTION) ×3 IMPLANT

## 2012-07-02 NOTE — Progress Notes (Signed)
Subjective: Patient reports nausea.    Objective: I have reviewed patient's vital signs, intake and output and medications.  General: alert and no distress Resp: clear to auscultation bilaterally Cardio: regular rate and rhythm, S1, S2 normal, no murmur, click, rub or gallop GI: soft, non-tender; bowel sounds normal; no masses,  no organomegaly Extremities: extremities normal, atraumatic, no cyanosis or edema Vaginal Bleeding: minimal   Assessment/Plan:   Doing well S/P Vag Hys and Salpingectomy  Home in the morning. LOS: 0 days    Janine Limbo 07/02/2012, 6:23 PM

## 2012-07-02 NOTE — Op Note (Signed)
OPERATIVE NOTE  [name]  DOB:    1960-10-18  MRN:    [id number]  CSN:    [id number]  Date of Surgery:  07/02/2012  Preoperative Diagnosis:  Fibroid uterus  Pelvic pressure symptoms  Low back pain  Obesity  Postoperative Diagnosis:  Same  Procedure:  Vaginal hysterectomy  Bilateral salpingectomy  Surgeon:  Leonard Schwartz, M.D.  Assistant:  Operating room team  Anesthetic:  General  Disposition:  The patient presents with the above-mentioned diagnosis. She understands the indications for surgical procedure.  She also understands the alternative treatment options. She accepts the risk of, but not limited to, anesthetic complications, bleeding, infections, and possible damage to the surrounding organs.  Findings:  The uterus weighed 205 g. Multiple fibroids were present. The largest fibroid measured 7 cm in size. The fallopian tubes and ovaries appeared normal.  Procedure:  The patient was taken to the operating room where a general anesthetic was given. The patient's lower abdomen, perineum, and vagina were prepped with multiple layers of Betadine. A Foley catheter was placed in the bladder. The patient was sterilely draped. An examination under anesthesia was performed. Operative findings are mentioned above. The cervix was injected with half percent Marcaine with epinephrine. A circumferential incision was made around the cervix. The vaginal mucosa was advanced anteriorly and posteriorly. The anterior cul-de-sac and in the posterior cul-de-sac were sharply entered. Alternating from right to left the uterosacral ligaments, paracervical tissues, parametrial tissues, and uterine arteries were clamped, cut, sutured, and tied securely. The upper pedicles were then clamped and cut. The uterus was removed from the operative field. The upper pedicles were secured using free ties and then suture ligatures. Hemostasis was confirmed. The right fallopian tube  was identified. The mesosalpinx was clamped and cut. Suture ligatures were applied for hemostasis. An identical procedure was carried out on the opposite side. The sutures attached to the uterosacral ligaments were brought out through the vaginal angles and then tied securely. A McCall culdoplasty suture was placed in the posterior cul-de-sac incorporating the uterosacral ligaments bilaterally and the posterior peritoneum. A final check was made for hemostasis and again hemostasis was confirmed. The vaginal cuff was closed using figure-of-eight sutures incorporating the anterior vaginal mucosa, the anterior peritoneum, posterior peritoneum, and the posterior vaginal mucosa. The McCall culdoplasty suture was tied securely and the apex of the vagina was noted to elevate into the midpelvis. The patient tolerated her procedure well. She was awakened from her anesthetic without difficulty and then transported to the recovery room in stable condition. Sponge, needle, and instrument counts were correct on 2 occasions. The estimated blood loss was 50 cc's. 0 Vicryl is the suture material used throughout the procedure. The uterus and fallopian tubes were sent to pathology.   Leonard Schwartz, M.D.

## 2012-07-02 NOTE — Addendum Note (Signed)
Addendum  created 07/02/12 1603 by Shanon Payor, CRNA   Modules edited:Notes Section

## 2012-07-02 NOTE — Anesthesia Preprocedure Evaluation (Signed)
Anesthesia Evaluation  Patient identified by MRN, date of birth, ID band Patient awake    Reviewed: Allergy & Precautions, H&P , NPO status , Patient's Chart, lab work & pertinent test results, reviewed documented beta blocker date and time   History of Anesthesia Complications Negative for: history of anesthetic complications  Airway Mallampati: II TM Distance: >3 FB Neck ROM: full    Dental  (+) Teeth Intact   Pulmonary neg pulmonary ROS,  breath sounds clear to auscultation  Pulmonary exam normal       Cardiovascular Exercise Tolerance: Good hypertension, On Medications Rhythm:regular Rate:Normal     Neuro/Psych Three back surgeries including lumbar fusion, on vicodin for back pain (takes 3 times per week) negative psych ROS   GI/Hepatic negative GI ROS, Neg liver ROS,   Endo/Other  negative endocrine ROS  Renal/GU negative Renal ROS  Female GU complaint     Musculoskeletal  (+) Arthritis -, Osteoarthritis,    Abdominal   Peds  Hematology negative hematology ROS (+)   Anesthesia Other Findings   Reproductive/Obstetrics negative OB ROS                           Anesthesia Physical Anesthesia Plan  ASA: II  Anesthesia Plan: General ETT   Post-op Pain Management:    Induction:   Airway Management Planned:   Additional Equipment:   Intra-op Plan:   Post-operative Plan:   Informed Consent: I have reviewed the patients History and Physical, chart, labs and discussed the procedure including the risks, benefits and alternatives for the proposed anesthesia with the patient or authorized representative who has indicated his/her understanding and acceptance.   Dental Advisory Given  Plan Discussed with: CRNA and Surgeon  Anesthesia Plan Comments:         Anesthesia Quick Evaluation

## 2012-07-02 NOTE — Anesthesia Postprocedure Evaluation (Signed)
  Anesthesia Post-op Note  Patient: Kari Williams  Procedure(s) Performed: Procedure(s) (LRB) with comments: HYSTERECTOMY VAGINAL (N/A) BILATERAL TUBAL LIGATION (Bilateral)  Patient Location: [facility]  Anesthesia Type:General  Level of Consciousness: awake, alert  and oriented  Airway and Oxygen Therapy: Patient Spontanous Breathing and Patient connected to nasal cannula oxygen  Post-op Pain: none  Post-op Assessment: Post-op Vital signs reviewed and Patient's Cardiovascular Status Stable  Post-op Vital Signs: Reviewed and stable  Complications: No apparent anesthesia complications

## 2012-07-02 NOTE — Anesthesia Postprocedure Evaluation (Signed)
Anesthesia Post Note  Patient: Kari Williams  Procedure(s) Performed: Procedure(s) (LRB): HYSTERECTOMY VAGINAL (N/A) BILATERAL TUBAL LIGATION (Bilateral)  Anesthesia type: General  Patient location: PACU  Post pain: Pain level controlled  Post assessment: Post-op Vital signs reviewed  Last Vitals:  Filed Vitals:   07/02/12 1150  BP:   Pulse:   Temp:   Resp: 16    Post vital signs: Reviewed  Level of consciousness: sedated  Complications: No apparent anesthesia complications

## 2012-07-02 NOTE — Transfer of Care (Signed)
Immediate Anesthesia Transfer of Care Note  Patient: Kari Williams  Procedure(s) Performed: Procedure(s) (LRB) with comments: HYSTERECTOMY VAGINAL (N/A) BILATERAL TUBAL LIGATION (Bilateral)  Patient Location: PACU  Anesthesia Type:General  Level of Consciousness: awake, sedated and patient cooperative  Airway & Oxygen Therapy: Patient Spontanous Breathing and Patient connected to nasal cannula oxygen  Post-op Assessment: Report given to PACU RN and Post -op Vital signs reviewed and stable  Post vital signs: Reviewed and stable  Complications: No apparent anesthesia complications

## 2012-07-02 NOTE — H&P (Signed)
The patient was interviewed and examined today.  The previously documented history and physical examination was reviewed. There are no changes. The operative procedure was reviewed. The risks and benefits were outlined again. The specific risks include, but are not limited to, anesthetic complications, bleeding, infections, and possible damage to the surrounding organs. The patient's questions were answered.  We are ready to proceed as outlined. The likelihood of the patient achieving the goals of this procedure is very likely.   BP 109/68  Pulse 87  Temp 98.2 F (36.8 C) (Oral)  Ht 5\' 4"  (1.626 m)  Wt 175 lb (79.379 kg)  BMI 30.04 kg/m2  SpO2 100%  Leonard Schwartz, M.D.

## 2012-07-03 ENCOUNTER — Encounter (HOSPITAL_COMMUNITY): Payer: Self-pay | Admitting: Obstetrics and Gynecology

## 2012-07-03 LAB — CBC
MCH: 31.5 pg (ref 26.0–34.0)
MCV: 95.8 fL (ref 78.0–100.0)
Platelets: 316 10*3/uL (ref 150–400)
RDW: 13.2 % (ref 11.5–15.5)
WBC: 31.1 10*3/uL — ABNORMAL HIGH (ref 4.0–10.5)

## 2012-07-03 MED ORDER — INFLUENZA VIRUS VACC SPLIT PF IM SUSP
0.5000 mL | Freq: Once | INTRAMUSCULAR | Status: AC
  Administered 2012-07-03: 0.5 mL via INTRAMUSCULAR
  Filled 2012-07-03: qty 0.5

## 2012-07-03 MED ORDER — HYDROCODONE-ACETAMINOPHEN 5-500 MG PO TABS: 1.0000 | ORAL_TABLET | ORAL | Status: DC | PRN

## 2012-07-03 MED ORDER — IBUPROFEN 800 MG PO TABS: 800.0000 mg | ORAL_TABLET | Freq: Three times a day (TID) | ORAL | Status: DC | PRN

## 2012-07-03 MED ORDER — ONDANSETRON HCL 8 MG PO TABS: 8.0000 mg | ORAL_TABLET | Freq: Three times a day (TID) | ORAL | Status: DC | PRN

## 2012-07-03 NOTE — Progress Notes (Signed)
Pt discharged to home with husband.  Condition stable.  Pt to car via wheelchair with Dolphus Jenny, NT.  No equipment ordered for home at discharge.

## 2012-07-03 NOTE — Progress Notes (Signed)
Subjective: Patient reports tolerating PO.    Objective: I have reviewed patient's vital signs.  General: no distress GI: Soft and nontender Extremities: extremities normal, atraumatic, no cyanosis or edema Vaginal Bleeding: minimal   Assessment/Plan: Doing well. Ready for discharge. Check CBC. Instructions given.  LOS: 1 day    Clarivel Callaway V 07/03/2012, 8:18 AM

## 2012-07-03 NOTE — Discharge Summary (Signed)
  Physician Discharge Summary  Patient ID: Kari Williams MRN: [id number] DOB/AGE: Sep 11, 1960 51 y.o.  Admit date:         07/02/2012 Discharge date: 07/03/2012  Admission Diagnoses:  Fibroids; Pelvic Pressure; Lower Back Pain; Obesity  Discharge Diagnoses:   Fibroids; Pelvic Pressure; Lower Back Pain; Obesity  Procedures this Admission:  07/02/2012  Procedure(s) (LRB): HYSTERECTOMY VAGINAL (N/A) BILATERAL salpingectomy  Discharged Condition: good   Admission Hx and PE: The patient has been followed at the [address] division of [facility]. She has a history of  Fibroids; Pelvic Pressure; Lower Back Pain; Obesity. Please see her documented history and physical exam.   Hospital course:  On the day of admission, the patient underwent the following Procedure(s):  HYSTERECTOMY VAGINAL BILATERAL salpingectomy.   Operative findings included a multi-fibroid uterus. The fallopian tubes and ovaries were normal.. Her postoperative course was uneventful. She quickly tolerated a regular diet. Her postoperative pain was controlled with oral medication. She remained afebrile. Today she was felt to be ready for discharge.  Labs:  Hemoglobin  Date Value Range Status  06/25/2012 12.4  12.0 - 15.0 g/dL Final     HCT  Date Value Range Status  06/25/2012 36.4  36.0 - 46.0 % Final    Consults: None  Final pathology report: Pending at the time of discharge  Disposition:  The patient will be discharged to home. She has been given a copy of the discharge instructions as prepared by the [facility] for patients who have undergone the Procedure(s): HYSTERECTOMY VAGINAL BILATERAL TUBAL LIGATION.   Discharge Orders    Future Appointments: Provider: Department: Dept Phone: Center:   08/11/2012 1:45 PM Kirkland Hun, MD [facility] [phone] None     Future Orders Please Complete By Expires   Foley catheter - discontinue          Medication List     As of 07/03/2012  8:15 AM    TAKE these medications         EXFORGE 10-160 MG per tablet   Generic drug: amLODipine-valsartan   Take 1 tablet by mouth Daily.      HYDROcodone-acetaminophen 5-500 MG per tablet   Commonly known as: VICODIN   Take 1-2 tablets by mouth every 4 (four) hours as needed for pain.      VICODIN PO   Take 1 tablet by mouth daily as needed.      ibuprofen 800 MG tablet   Commonly known as: ADVIL,MOTRIN   Take 1 tablet (800 mg total) by mouth every 8 (eight) hours as needed for pain.      ondansetron 8 MG tablet   Commonly known as: ZOFRAN   Take 1 tablet (8 mg total) by mouth every 8 (eight) hours as needed for nausea.      torsemide 20 MG tablet   Commonly known as: DEMADEX   Take 1 tablet by mouth Daily.           Follow-up Information    Follow up with Janine Limbo, MD. In 6 weeks.   Contact information:   [address] [place] [address] [phone]          Signed: Janine Limbo 07/03/2012, 8:15 AM

## 2012-07-04 ENCOUNTER — Telehealth: Payer: Self-pay | Admitting: Obstetrics and Gynecology

## 2012-07-04 NOTE — Telephone Encounter (Signed)
VM from [facility] [phone]. 07/03/12 at 4:39 pm.  DR AVS wrote Rx fro Vicodin 5/500. Not available.  Needs to be lower dose of Acetominophen.

## 2012-07-04 NOTE — Telephone Encounter (Signed)
Lm on pt vm to call back

## 2012-07-04 NOTE — Telephone Encounter (Signed)
Contacted [facility] pharmacy regarding rx, pharmacy states that rx has been filled. Informed pt, pt voiced understanding.

## 2012-08-04 ENCOUNTER — Telehealth: Payer: Self-pay | Admitting: Obstetrics and Gynecology

## 2012-08-04 ENCOUNTER — Telehealth: Payer: Self-pay

## 2012-08-04 NOTE — Telephone Encounter (Signed)
Have patient come in to the office this week at 4:30 on Wednesday.  Dr. Stefano Gaul

## 2012-08-04 NOTE — Telephone Encounter (Signed)
Patient was called back regarding Triage message. Pt concerned about discharge since hysterectomy. Pt described discharge as pinkish, watery  Pt denies odor, denies itching , or green discharge Changing a pad 3 times a day Having labor pains in anus. Pt asked was this normal Has post op scheduled for 08-12-2011   Will consult w/ AVS.

## 2012-08-04 NOTE — Telephone Encounter (Signed)
Per AVS pt needs evaluation  appt made for 08-07-11 @ 4 :30pm  Pt agreeable  [facility] CMA

## 2012-08-06 ENCOUNTER — Ambulatory Visit (INDEPENDENT_AMBULATORY_CARE_PROVIDER_SITE_OTHER): Payer: BC Managed Care – PPO | Admitting: Obstetrics and Gynecology

## 2012-08-06 ENCOUNTER — Encounter: Payer: Self-pay | Admitting: Obstetrics and Gynecology

## 2012-08-06 VITALS — BP 118/74 | HR 72 | Wt 188.0 lb

## 2012-08-06 DIAGNOSIS — N898 Other specified noninflammatory disorders of vagina: Secondary | ICD-10-CM

## 2012-08-06 NOTE — Progress Notes (Signed)
Subjective: The patient had a vaginal hysterectomy with bilateral salpingectomy on July 02, 2012. The path report showed fibroids. The endometrium was benign. Patient reports a vaginal discharge without an odor. She complains of a vague pelvic discomfort.  Objective: I have reviewed patient's vital signs and pathology.  GI: soft and nontender  Pelvic exam: External genitalia: Normal Vagina: Yellow discharge present. No older. Stitches present. Cuff is healing well. Silver nitrate applied. Bimanual exam: No masses present. Slight tenderness at the bladder.  Wet prep: Multiple white cells and red cells. No clue cells. Whiff is negative. PH is 5.5.  Assessment/Plan: Vaginal discharge following vaginal hysterectomy. No evidence of infection. MetroGel offered but declined. Return to office in 2 weeks or as needed.  Kari Williams V 08/06/2012, 6:10 PM       Color: pinkish color  Odor: no Itching:no Thin:yes Thick:yes Fever:no Dyspareunia:no Hx PID:no HX STD:no Pelvic Pain:yes Desires Gc/CT:no Desires HIV,RPR,HbsAG:no Pt stated she is having some pelvic pain.

## 2012-08-11 ENCOUNTER — Encounter: Payer: Self-pay | Admitting: Obstetrics and Gynecology

## 2012-08-11 ENCOUNTER — Ambulatory Visit (INDEPENDENT_AMBULATORY_CARE_PROVIDER_SITE_OTHER): Payer: BC Managed Care – PPO | Admitting: Obstetrics and Gynecology

## 2012-08-11 VITALS — BP 100/80 | Temp 97.0°F | Ht 64.0 in | Wt 175.0 lb

## 2012-08-11 DIAGNOSIS — D219 Benign neoplasm of connective and other soft tissue, unspecified: Secondary | ICD-10-CM

## 2012-08-11 DIAGNOSIS — D259 Leiomyoma of uterus, unspecified: Secondary | ICD-10-CM

## 2012-08-11 NOTE — Progress Notes (Signed)
ANNUAL GYNECOLOGIC EXAMINATION   Kari Williams is a 52 y.o. female, G7P2, who presents for a postoperative visit.  On July 02, 2012 she had a vaginal hysterectomy with bilateral salpingectomy.  Operative findings included benign fibroids. She has had some spotting since her surgery but her pain is now resolved.  Objective:    BP 100/80  Temp 97 F (36.1 C)  Ht 5\' 4"  (1.626 m)  Wt 175 lb (79.379 kg)  BMI 30.04 kg/m2    Weight:  Wt Readings from Last 1 Encounters:  08/11/12 175 lb (79.379 kg)          BMI: Body mass index is 30.04 kg/(m^2).  General Appearance: Alert, appropriate appearance for age. No acute distress Gastrointestinal: Soft, non-tender, no masses or organomegaly;   ++++++++++++++++++++++++++++++++++++++++++++++++++++++++  External genitalia: normal general appearance Vaginal: healing cuff, a few stitches remain, silver nitrate applied, no bleeding Cervix: absent Adnexa: normal bimanual exam Uterus: absent  Assessment:   Doing well following vaginal hysterectomy and bilateral salpingectomy for fibroids   Plan:    Medications prescribed: Metronidazole declined  Return to normal activities: Yes   Return to office: 3 months for a(an) annual exam.  Leonard Schwartz M.D. 08/11/2012   DATE OF SURGERY: 07/02/2012 TYPE OF SURGERY:Vaginal Hysterectomy/Bilateral Salpingectomy PAIN:No VAG BLEEDING: no VAG DISCHARGE: Pinkish NORMAL GI FUNCTN: yes NORMAL GU FUNCTN: yes

## 2012-08-19 ENCOUNTER — Telehealth: Payer: Self-pay | Admitting: Obstetrics and Gynecology

## 2012-08-19 NOTE — Telephone Encounter (Signed)
Pt called regarding Rx request. Told pt I will consult w/ AVS. Pt voiced understanding.  [facility] CMA

## 2012-08-19 NOTE — Telephone Encounter (Signed)
AVS pt  

## 2012-08-22 ENCOUNTER — Telehealth: Payer: Self-pay

## 2012-08-22 ENCOUNTER — Other Ambulatory Visit: Payer: Self-pay

## 2012-08-22 MED ORDER — METRONIDAZOLE 0.75 % VA GEL: 1.0000 | Freq: Every day | VAGINAL | Status: DC

## 2012-08-22 NOTE — Telephone Encounter (Signed)
Rx Metrogel E-scribed sent to pt pharm  Ok per AVS  Pt called and made aware.  [facility] CMA

## 2013-06-04 ENCOUNTER — Other Ambulatory Visit: Payer: Self-pay

## 2014-05-14 ENCOUNTER — Other Ambulatory Visit: Payer: Self-pay

## 2014-05-31 ENCOUNTER — Encounter: Payer: Self-pay | Admitting: Obstetrics and Gynecology

## 2014-08-13 ENCOUNTER — Other Ambulatory Visit (HOSPITAL_COMMUNITY): Payer: Self-pay | Admitting: Neurosurgery

## 2014-08-13 DIAGNOSIS — M5416 Radiculopathy, lumbar region: Secondary | ICD-10-CM

## 2014-09-01 ENCOUNTER — Ambulatory Visit (HOSPITAL_COMMUNITY)
Admission: RE | Admit: 2014-09-01 | Discharge: 2014-09-01 | Disposition: A | Discharge status: Home / Self Care | Payer: BLUE CROSS/BLUE SHIELD | Source: Ambulatory Visit | Attending: Neurosurgery | Admitting: Neurosurgery

## 2014-09-01 ENCOUNTER — Other Ambulatory Visit (HOSPITAL_COMMUNITY): Payer: Self-pay

## 2014-09-01 ENCOUNTER — Encounter (HOSPITAL_COMMUNITY): Payer: Self-pay

## 2014-09-01 VITALS — BP 105/69 | HR 82 | Temp 97.1°F | Resp 18 | Ht 63.0 in | Wt 185.0 lb

## 2014-09-01 DIAGNOSIS — M5416 Radiculopathy, lumbar region: Secondary | ICD-10-CM | POA: Diagnosis present

## 2014-09-01 DIAGNOSIS — R200 Anesthesia of skin: Secondary | ICD-10-CM | POA: Diagnosis not present

## 2014-09-01 DIAGNOSIS — M5417 Radiculopathy, lumbosacral region: Secondary | ICD-10-CM | POA: Diagnosis present

## 2014-09-01 MED ORDER — ONDANSETRON HCL 4 MG/2ML IJ SOLN: 4.0000 mg | Freq: Four times a day (QID) | INTRAMUSCULAR | Status: DC | PRN

## 2014-09-01 MED ORDER — DIAZEPAM 5 MG PO TABS
ORAL_TABLET | ORAL | Status: AC
  Administered 2014-09-01: 10 mg via ORAL
  Filled 2014-09-01: qty 2

## 2014-09-01 MED ORDER — IOHEXOL 180 MG/ML  SOLN
20.0000 mL | Freq: Once | INTRAMUSCULAR | Status: AC | PRN
  Administered 2014-09-01: 16 mL via INTRAVENOUS

## 2014-09-01 MED ORDER — DIAZEPAM 5 MG PO TABS
10.0000 mg | ORAL_TABLET | Freq: Once | ORAL | Status: AC
  Administered 2014-09-01: 10 mg via ORAL

## 2014-09-01 NOTE — Discharge Instructions (Signed)
Myelography, Care After °These instructions give you information on caring for yourself after your procedure. Your doctor may also give you specific instructions. Call your doctor if you have any problems or questions after your procedure. °HOME CARE °· Rest often the first day. °· When you rest, lie flat, with your head slightly raised (elevated). °· Avoid heavy lifting and activity for 48 hours. °· You may take the bandage (dressing) off 1 day after the test. °GET HELP RIGHT AWAY IF:  °· You have a very bad headache. °· You have a fever. °MAKE SURE YOU: °· Understand these instructions. °· Will watch your condition. °· Will get help right away if you are not doing well or get worse. °Document Released: 04/24/2008 Document Revised: 11/30/2013 Document Reviewed: 11/04/2013 °ExitCare® Patient Information ©2015 [employer]. This information is not intended to replace advice given to you by your health care provider. Make sure you discuss any questions you have with your health care provider. ° ° °

## 2014-09-03 NOTE — Op Note (Signed)
09/01/2014 Lumbar Myelogram  PATIENT:  Kari Williams is a 54 y.o. female with lower back and right lower extremity pain  PRE-OPERATIVE DIAGNOSIS:  Lumbago, radiculopathy lumbar  POST-OPERATIVE DIAGNOSIS:  same  PROCEDURE:  Lumbar Myelogram  SURGEON:  Spiros Greenfeld  ANESTHESIA:   local LOCAL MEDICATIONS USED:  LIDOCAINE  Procedure Note: Kari Williams is a 54 y.o. female Was taken to the fluoroscopy suite and  positioned prone on the fluoroscopy table. Her back was prepared and draped in a sterile manner. I infiltrated 7 cc into the lumbar region. I then introduced a spinal needle into the thecal sac at the L3/4 interlaminar space. I infiltrated 20cc of Omnipaque 180 into the thecal sac. Fluoroscopy showed the needle and contrast in the thecal sac. Kari Williams tolerated the procedure well. she Will be taken to CT for evaluation.     PATIENT DISPOSITION:  Short Stay

## 2015-10-13 DIAGNOSIS — H2013 Chronic iridocyclitis, bilateral: Secondary | ICD-10-CM | POA: Insufficient documentation

## 2015-10-13 DIAGNOSIS — H35371 Puckering of macula, right eye: Secondary | ICD-10-CM | POA: Insufficient documentation

## 2015-10-13 DIAGNOSIS — H2513 Age-related nuclear cataract, bilateral: Secondary | ICD-10-CM | POA: Insufficient documentation

## 2015-11-01 DIAGNOSIS — H35033 Hypertensive retinopathy, bilateral: Secondary | ICD-10-CM | POA: Insufficient documentation

## 2015-11-29 DIAGNOSIS — G5600 Carpal tunnel syndrome, unspecified upper limb: Secondary | ICD-10-CM | POA: Insufficient documentation

## 2016-05-31 DIAGNOSIS — E119 Type 2 diabetes mellitus without complications: Secondary | ICD-10-CM | POA: Diagnosis not present

## 2016-05-31 DIAGNOSIS — I10 Essential (primary) hypertension: Secondary | ICD-10-CM | POA: Diagnosis not present

## 2016-05-31 DIAGNOSIS — Z7682 Awaiting organ transplant status: Secondary | ICD-10-CM | POA: Diagnosis not present

## 2016-05-31 DIAGNOSIS — E1143 Type 2 diabetes mellitus with diabetic autonomic (poly)neuropathy: Secondary | ICD-10-CM | POA: Diagnosis not present

## 2016-06-28 ENCOUNTER — Other Ambulatory Visit: Payer: Self-pay | Admitting: Internal Medicine

## 2016-06-28 ENCOUNTER — Ambulatory Visit (HOSPITAL_COMMUNITY)
Admission: RE | Admit: 2016-06-28 | Discharge: 2016-06-28 | Disposition: A | Discharge status: Home / Self Care | Payer: BLUE CROSS/BLUE SHIELD | Source: Ambulatory Visit | Attending: Internal Medicine | Admitting: Internal Medicine

## 2016-06-28 DIAGNOSIS — R0789 Other chest pain: Secondary | ICD-10-CM

## 2016-06-28 DIAGNOSIS — M791 Myalgia: Secondary | ICD-10-CM | POA: Diagnosis not present

## 2016-06-28 DIAGNOSIS — R918 Other nonspecific abnormal finding of lung field: Secondary | ICD-10-CM | POA: Diagnosis not present

## 2016-06-28 DIAGNOSIS — R079 Chest pain, unspecified: Secondary | ICD-10-CM | POA: Diagnosis not present

## 2016-06-28 DIAGNOSIS — D869 Sarcoidosis, unspecified: Secondary | ICD-10-CM

## 2016-06-28 DIAGNOSIS — E061 Subacute thyroiditis: Secondary | ICD-10-CM | POA: Diagnosis not present

## 2016-06-28 DIAGNOSIS — E538 Deficiency of other specified B group vitamins: Secondary | ICD-10-CM | POA: Diagnosis not present

## 2016-06-28 DIAGNOSIS — I10 Essential (primary) hypertension: Secondary | ICD-10-CM | POA: Diagnosis not present

## 2016-07-18 ENCOUNTER — Other Ambulatory Visit: Payer: Self-pay | Admitting: Internal Medicine

## 2016-07-18 ENCOUNTER — Ambulatory Visit (HOSPITAL_COMMUNITY)
Admission: RE | Admit: 2016-07-18 | Discharge: 2016-07-18 | Disposition: A | Discharge status: Home / Self Care | Payer: BLUE CROSS/BLUE SHIELD | Source: Ambulatory Visit | Attending: Internal Medicine | Admitting: Internal Medicine

## 2016-07-18 DIAGNOSIS — I10 Essential (primary) hypertension: Secondary | ICD-10-CM | POA: Diagnosis not present

## 2016-07-18 DIAGNOSIS — D696 Thrombocytopenia, unspecified: Secondary | ICD-10-CM | POA: Diagnosis not present

## 2016-07-18 DIAGNOSIS — R05 Cough: Secondary | ICD-10-CM | POA: Diagnosis not present

## 2016-07-18 DIAGNOSIS — Z79899 Other long term (current) drug therapy: Secondary | ICD-10-CM | POA: Diagnosis not present

## 2016-07-18 DIAGNOSIS — R52 Pain, unspecified: Secondary | ICD-10-CM

## 2016-07-18 DIAGNOSIS — H209 Unspecified iridocyclitis: Secondary | ICD-10-CM | POA: Diagnosis not present

## 2016-07-18 DIAGNOSIS — J189 Pneumonia, unspecified organism: Secondary | ICD-10-CM | POA: Diagnosis not present

## 2016-07-18 DIAGNOSIS — R079 Chest pain, unspecified: Secondary | ICD-10-CM | POA: Diagnosis not present

## 2016-07-18 DIAGNOSIS — E061 Subacute thyroiditis: Secondary | ICD-10-CM | POA: Diagnosis not present

## 2016-07-18 DIAGNOSIS — K209 Esophagitis, unspecified: Secondary | ICD-10-CM | POA: Diagnosis not present

## 2016-07-19 DIAGNOSIS — H209 Unspecified iridocyclitis: Secondary | ICD-10-CM | POA: Diagnosis not present

## 2016-07-19 DIAGNOSIS — R7989 Other specified abnormal findings of blood chemistry: Secondary | ICD-10-CM | POA: Diagnosis not present

## 2016-07-19 DIAGNOSIS — R079 Chest pain, unspecified: Secondary | ICD-10-CM | POA: Diagnosis not present

## 2016-07-19 DIAGNOSIS — Z79899 Other long term (current) drug therapy: Secondary | ICD-10-CM | POA: Diagnosis not present

## 2016-08-07 DIAGNOSIS — H2013 Chronic iridocyclitis, bilateral: Secondary | ICD-10-CM | POA: Diagnosis not present

## 2016-08-07 DIAGNOSIS — H35371 Puckering of macula, right eye: Secondary | ICD-10-CM | POA: Diagnosis not present

## 2016-08-07 DIAGNOSIS — H35033 Hypertensive retinopathy, bilateral: Secondary | ICD-10-CM | POA: Diagnosis not present

## 2016-08-07 DIAGNOSIS — H2513 Age-related nuclear cataract, bilateral: Secondary | ICD-10-CM | POA: Diagnosis not present

## 2016-12-04 DIAGNOSIS — Z01411 Encounter for gynecological examination (general) (routine) with abnormal findings: Secondary | ICD-10-CM | POA: Diagnosis not present

## 2016-12-04 DIAGNOSIS — Z1231 Encounter for screening mammogram for malignant neoplasm of breast: Secondary | ICD-10-CM | POA: Diagnosis not present

## 2016-12-04 DIAGNOSIS — K625 Hemorrhage of anus and rectum: Secondary | ICD-10-CM | POA: Diagnosis not present

## 2016-12-06 DIAGNOSIS — K625 Hemorrhage of anus and rectum: Secondary | ICD-10-CM | POA: Diagnosis not present

## 2016-12-06 DIAGNOSIS — Z1211 Encounter for screening for malignant neoplasm of colon: Secondary | ICD-10-CM | POA: Diagnosis not present

## 2016-12-06 DIAGNOSIS — E669 Obesity, unspecified: Secondary | ICD-10-CM | POA: Diagnosis not present

## 2016-12-06 DIAGNOSIS — Z800 Family history of malignant neoplasm of digestive organs: Secondary | ICD-10-CM | POA: Diagnosis not present

## 2016-12-28 DIAGNOSIS — Z800 Family history of malignant neoplasm of digestive organs: Secondary | ICD-10-CM | POA: Diagnosis not present

## 2016-12-28 DIAGNOSIS — K621 Rectal polyp: Secondary | ICD-10-CM | POA: Diagnosis not present

## 2016-12-28 DIAGNOSIS — Z1211 Encounter for screening for malignant neoplasm of colon: Secondary | ICD-10-CM | POA: Diagnosis not present

## 2016-12-28 DIAGNOSIS — D128 Benign neoplasm of rectum: Secondary | ICD-10-CM | POA: Diagnosis not present

## 2016-12-28 DIAGNOSIS — K5730 Diverticulosis of large intestine without perforation or abscess without bleeding: Secondary | ICD-10-CM | POA: Diagnosis not present

## 2017-01-10 DIAGNOSIS — N959 Unspecified menopausal and perimenopausal disorder: Secondary | ICD-10-CM | POA: Diagnosis not present

## 2017-02-26 DIAGNOSIS — E669 Obesity, unspecified: Secondary | ICD-10-CM | POA: Diagnosis not present

## 2017-02-26 DIAGNOSIS — I509 Heart failure, unspecified: Secondary | ICD-10-CM | POA: Diagnosis not present

## 2017-02-26 DIAGNOSIS — N186 End stage renal disease: Secondary | ICD-10-CM | POA: Diagnosis not present

## 2017-02-26 DIAGNOSIS — Z01810 Encounter for preprocedural cardiovascular examination: Secondary | ICD-10-CM | POA: Diagnosis not present

## 2017-04-22 DIAGNOSIS — Z7682 Awaiting organ transplant status: Secondary | ICD-10-CM | POA: Diagnosis not present

## 2017-07-13 DIAGNOSIS — F4323 Adjustment disorder with mixed anxiety and depressed mood: Secondary | ICD-10-CM | POA: Diagnosis not present

## 2017-08-20 DIAGNOSIS — F4323 Adjustment disorder with mixed anxiety and depressed mood: Secondary | ICD-10-CM | POA: Diagnosis not present

## 2017-09-10 DIAGNOSIS — N6320 Unspecified lump in the left breast, unspecified quadrant: Secondary | ICD-10-CM | POA: Diagnosis not present

## 2017-09-17 DIAGNOSIS — F4323 Adjustment disorder with mixed anxiety and depressed mood: Secondary | ICD-10-CM | POA: Diagnosis not present

## 2017-09-19 IMAGING — CR DG CHEST 2V
2 series · 2 of 2 positions shown · non-contrast
Comparison: 06/28/2016 chest radiograph.

CLINICAL DATA: Chest pain. Productive cough. Status post antibiotic
therapy for pneumonia.

EXAM:
CHEST  2 VIEW

[chest pa]
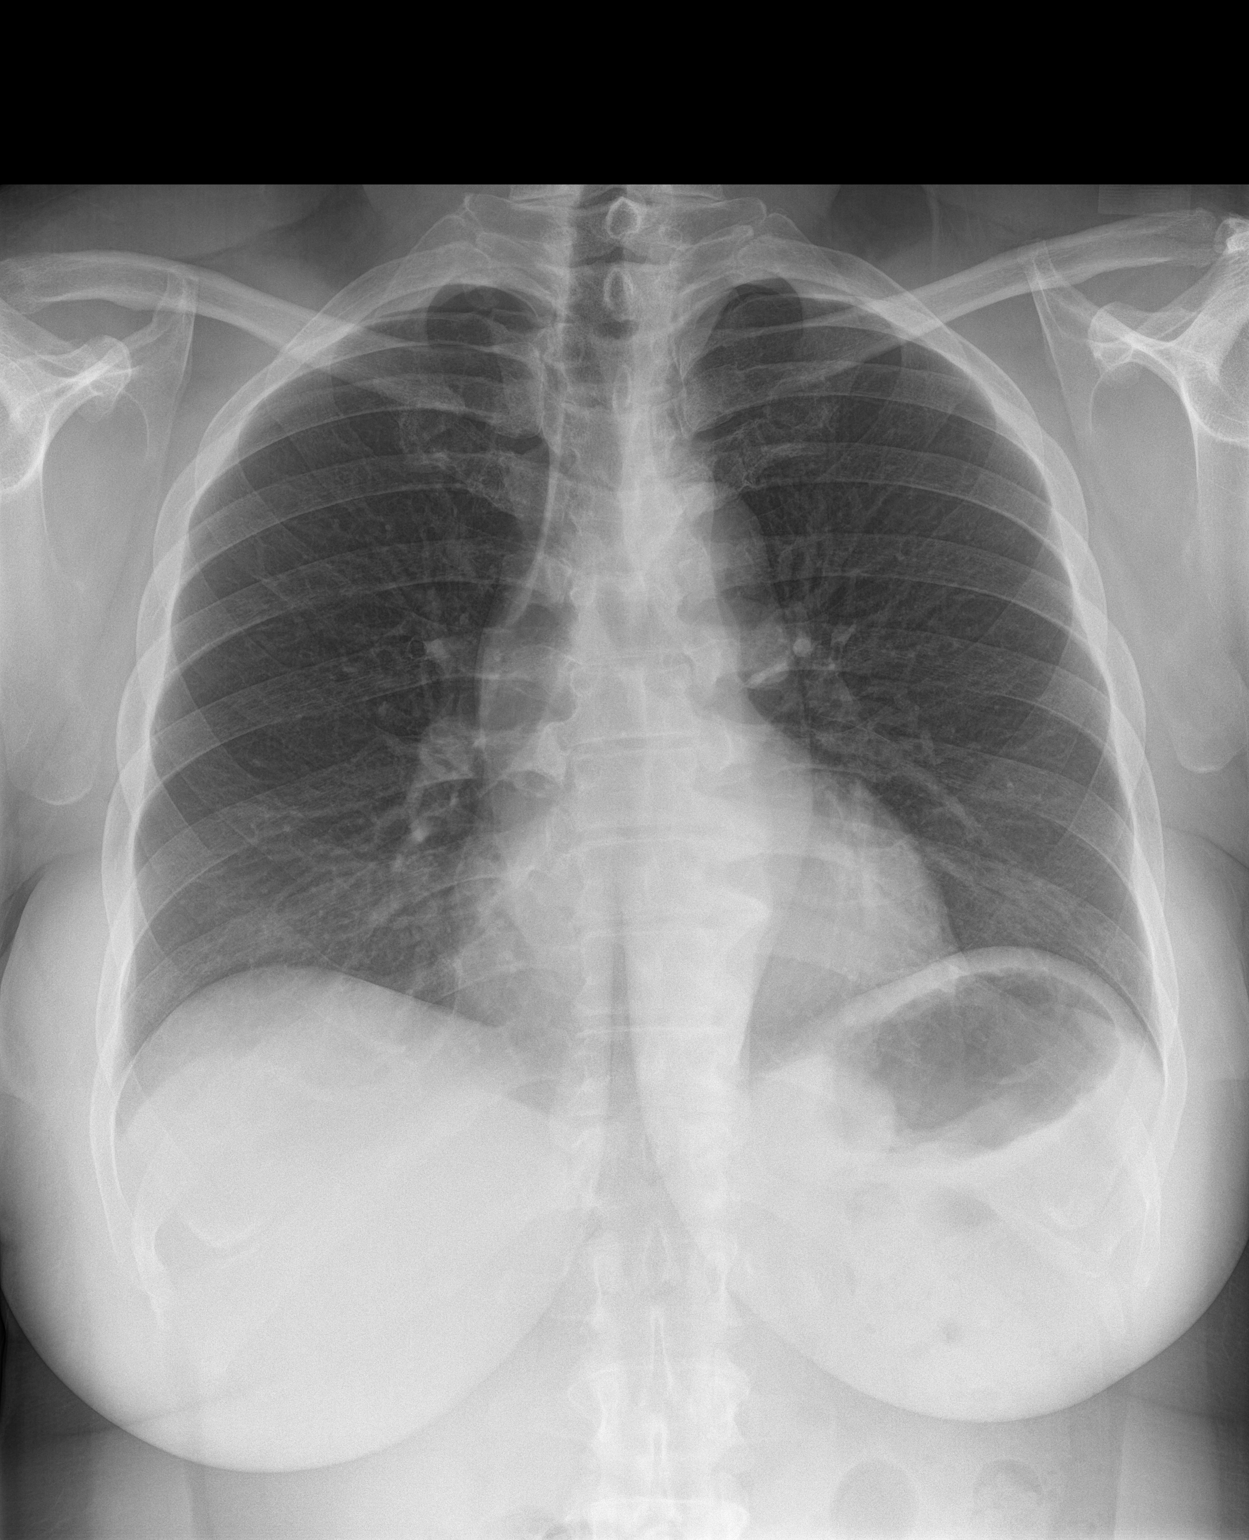

[chest lat]
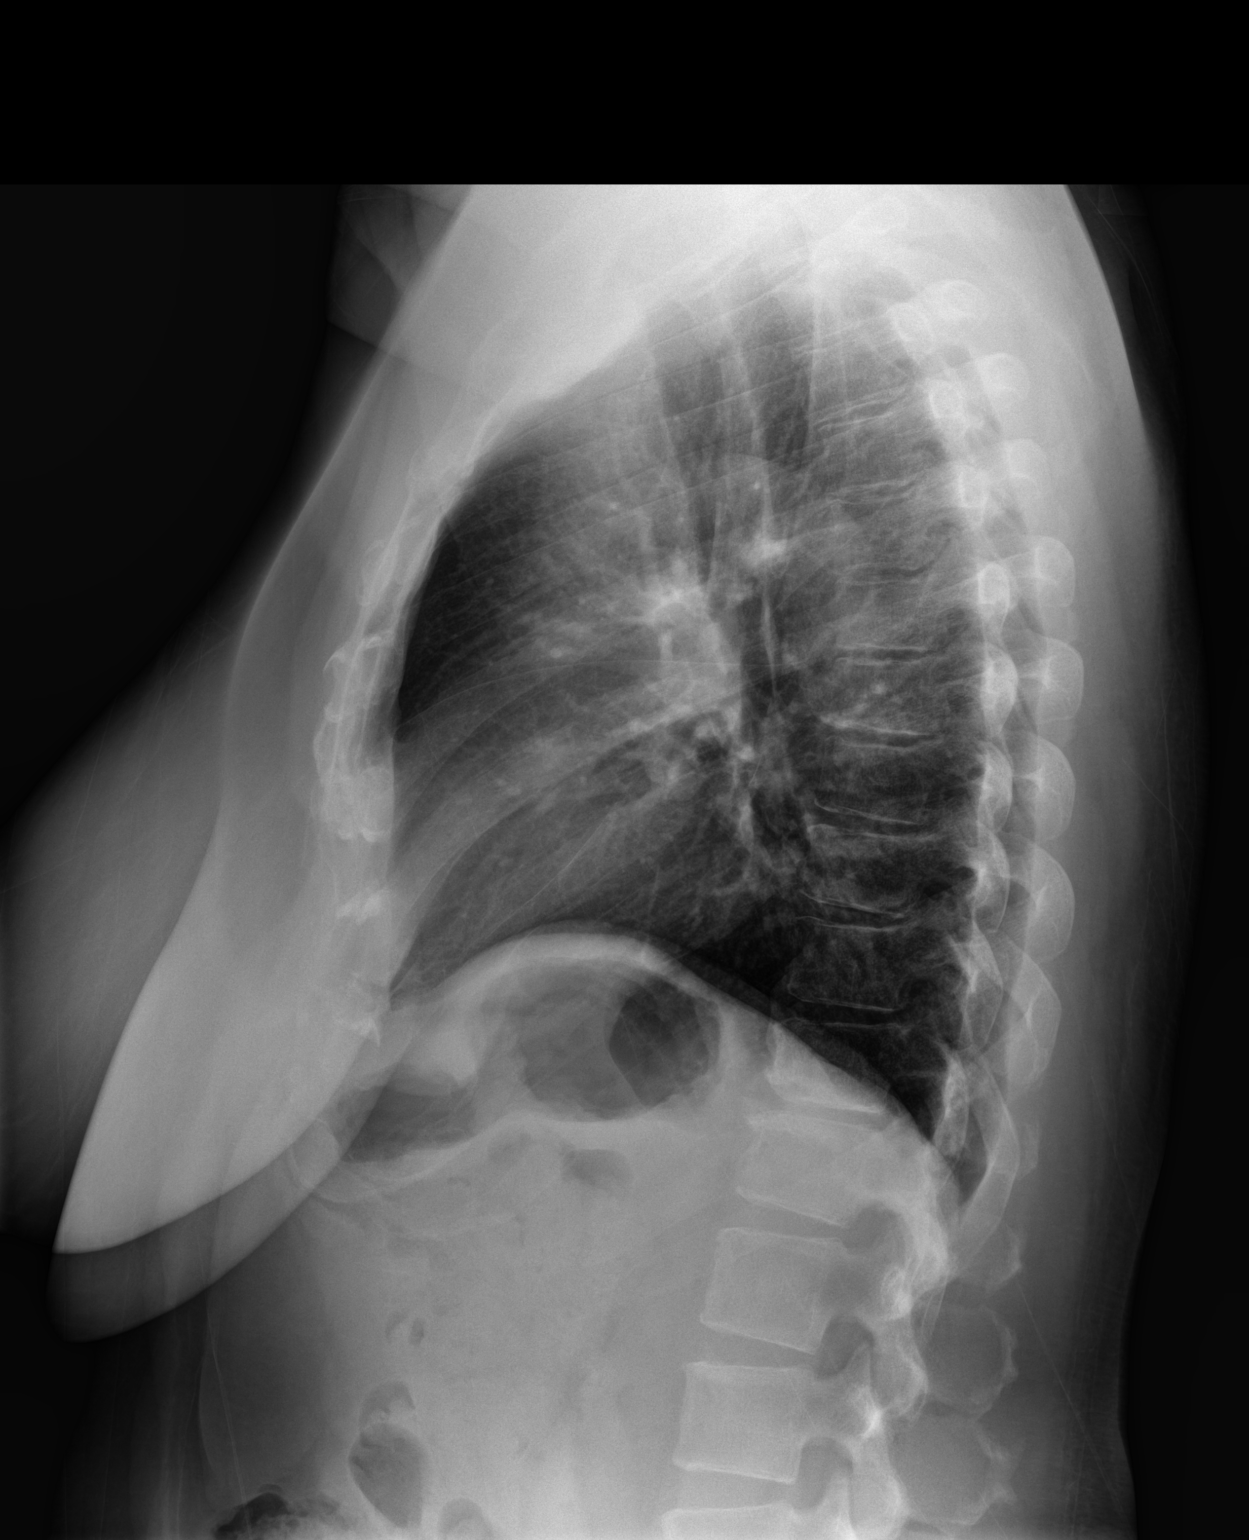

[2 of 2 positions shown; findings below may reference images not displayed]

FINDINGS: Stable cardiomediastinal silhouette with normal heart size. No
pneumothorax. No pleural effusion. No acute consolidative airspace
disease. No pulmonary edema. Previously described medial right lung
base opacity appears resolved. Previously described medial right
lung base opacity appears resolved.
IMPRESSION: No active cardiopulmonary disease.

## 2017-09-23 DIAGNOSIS — N6320 Unspecified lump in the left breast, unspecified quadrant: Secondary | ICD-10-CM | POA: Diagnosis not present

## 2017-09-23 DIAGNOSIS — N63 Unspecified lump in unspecified breast: Secondary | ICD-10-CM | POA: Diagnosis not present

## 2017-10-04 DIAGNOSIS — E663 Overweight: Secondary | ICD-10-CM | POA: Diagnosis not present

## 2017-10-04 DIAGNOSIS — H209 Unspecified iridocyclitis: Secondary | ICD-10-CM | POA: Diagnosis not present

## 2017-10-04 DIAGNOSIS — F324 Major depressive disorder, single episode, in partial remission: Secondary | ICD-10-CM | POA: Diagnosis not present

## 2017-10-04 DIAGNOSIS — I10 Essential (primary) hypertension: Secondary | ICD-10-CM | POA: Diagnosis not present

## 2017-10-19 DIAGNOSIS — F4323 Adjustment disorder with mixed anxiety and depressed mood: Secondary | ICD-10-CM | POA: Diagnosis not present

## 2017-11-13 DIAGNOSIS — F4323 Adjustment disorder with mixed anxiety and depressed mood: Secondary | ICD-10-CM | POA: Diagnosis not present

## 2017-12-12 DIAGNOSIS — N959 Unspecified menopausal and perimenopausal disorder: Secondary | ICD-10-CM | POA: Diagnosis not present

## 2017-12-12 DIAGNOSIS — Z01411 Encounter for gynecological examination (general) (routine) with abnormal findings: Secondary | ICD-10-CM | POA: Diagnosis not present

## 2017-12-12 DIAGNOSIS — Z6833 Body mass index (BMI) 33.0-33.9, adult: Secondary | ICD-10-CM | POA: Diagnosis not present

## 2018-05-26 DIAGNOSIS — F4321 Adjustment disorder with depressed mood: Secondary | ICD-10-CM | POA: Diagnosis not present

## 2018-05-26 DIAGNOSIS — Z23 Encounter for immunization: Secondary | ICD-10-CM | POA: Diagnosis not present

## 2018-09-24 DIAGNOSIS — M542 Cervicalgia: Secondary | ICD-10-CM | POA: Diagnosis not present

## 2018-09-24 DIAGNOSIS — F4321 Adjustment disorder with depressed mood: Secondary | ICD-10-CM | POA: Diagnosis not present

## 2018-09-24 DIAGNOSIS — G4700 Insomnia, unspecified: Secondary | ICD-10-CM | POA: Diagnosis not present

## 2018-09-24 DIAGNOSIS — G44209 Tension-type headache, unspecified, not intractable: Secondary | ICD-10-CM | POA: Diagnosis not present

## 2019-01-13 DIAGNOSIS — H9041 Sensorineural hearing loss, unilateral, right ear, with unrestricted hearing on the contralateral side: Secondary | ICD-10-CM | POA: Diagnosis not present

## 2019-01-23 DIAGNOSIS — H9041 Sensorineural hearing loss, unilateral, right ear, with unrestricted hearing on the contralateral side: Secondary | ICD-10-CM | POA: Diagnosis not present

## 2019-04-10 DIAGNOSIS — Z1231 Encounter for screening mammogram for malignant neoplasm of breast: Secondary | ICD-10-CM | POA: Diagnosis not present

## 2019-04-10 DIAGNOSIS — Z01419 Encounter for gynecological examination (general) (routine) without abnormal findings: Secondary | ICD-10-CM | POA: Diagnosis not present

## 2019-04-10 DIAGNOSIS — N951 Menopausal and female climacteric states: Secondary | ICD-10-CM | POA: Diagnosis not present

## 2019-06-19 DIAGNOSIS — F324 Major depressive disorder, single episode, in partial remission: Secondary | ICD-10-CM | POA: Diagnosis not present

## 2019-06-19 DIAGNOSIS — E663 Overweight: Secondary | ICD-10-CM | POA: Diagnosis not present

## 2019-06-19 DIAGNOSIS — I10 Essential (primary) hypertension: Secondary | ICD-10-CM | POA: Diagnosis not present

## 2019-06-19 DIAGNOSIS — F4321 Adjustment disorder with depressed mood: Secondary | ICD-10-CM | POA: Diagnosis not present

## 2019-12-29 DIAGNOSIS — H35371 Puckering of macula, right eye: Secondary | ICD-10-CM | POA: Diagnosis not present

## 2019-12-29 DIAGNOSIS — H35033 Hypertensive retinopathy, bilateral: Secondary | ICD-10-CM | POA: Diagnosis not present

## 2019-12-29 DIAGNOSIS — H15002 Unspecified scleritis, left eye: Secondary | ICD-10-CM | POA: Diagnosis not present

## 2019-12-29 DIAGNOSIS — H2513 Age-related nuclear cataract, bilateral: Secondary | ICD-10-CM | POA: Diagnosis not present

## 2020-01-25 DIAGNOSIS — F324 Major depressive disorder, single episode, in partial remission: Secondary | ICD-10-CM | POA: Diagnosis not present

## 2020-01-25 DIAGNOSIS — I10 Essential (primary) hypertension: Secondary | ICD-10-CM | POA: Diagnosis not present

## 2020-01-25 DIAGNOSIS — E7800 Pure hypercholesterolemia, unspecified: Secondary | ICD-10-CM | POA: Diagnosis not present

## 2020-01-25 DIAGNOSIS — F419 Anxiety disorder, unspecified: Secondary | ICD-10-CM | POA: Diagnosis not present

## 2020-06-06 NOTE — Progress Notes (Signed)
Office Visit Note  Patient: Kari Williams             Date of Birth: 1960-08-07           MRN: [id number]             PCP: Seward Carol, MD Referring: Seward Carol, MD Visit Date: 06/07/2020   Subjective:  New Patient (Initial Visit)  History of Present Illness: Kari Williams is a 59 y.o. 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 She has a previous history of treatment with methotrexate for iritis in 2010-2015 with Dr. Herbert Deaner and Dr. Charlestine Night. She had been off any treatment but visit with Dr. 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  Since earlier this year she has experienced eye redness watering and blurry vision.  She is more symptomatic complaint on the right than on the left currently but both sides bother her.  Besides the eye complaints she does also report some fairly diffuse arthralgias and morning stiffness up to 30 minutes.  She has not noticed any specific joint swelling warmth or erythema.  In the past she has also been treated with oral or ocular steroids but has not received any for the current relapse of symptoms.  Activities of Daily Living:  Patient reports morning stiffness for 30 minutes.   Patient Reports nocturnal pain.  Difficulty dressing/grooming: Denies Difficulty climbing stairs: Denies Difficulty getting out of chair: Denies Difficulty using hands for taps, buttons, cutlery, and/or writing: Denies  Review of Systems  Constitutional: Negative for fatigue.  HENT: Negative for mouth sores, mouth dryness and nose dryness.   Eyes: Positive for pain, itching, visual disturbance and dryness.  Respiratory: Negative for cough, hemoptysis, shortness of breath and difficulty breathing.   Cardiovascular: Negative for chest pain, palpitations and swelling in legs/feet.  Gastrointestinal: Negative for abdominal pain, blood in stool, constipation and diarrhea.  Endocrine:  Negative for increased urination.  Genitourinary: Negative for painful urination.  Musculoskeletal: Positive for arthralgias, joint pain and morning stiffness. Negative for joint swelling, myalgias, muscle weakness, muscle tenderness and myalgias.  Skin: Negative for color change, rash and redness.  Allergic/Immunologic: Negative for susceptible to infections.  Neurological: Positive for dizziness. Negative for numbness, headaches, memory loss and weakness.  Hematological: Negative for swollen glands.  Psychiatric/Behavioral: Positive for sleep disturbance. Negative for confusion.    PMFS History:  Patient Active Problem List   Diagnosis Date Noted  . Scleritis of left eye 06/07/2020  . High risk medication use 06/07/2020  . Carpal tunnel syndrome 11/29/2015  . Hypertensive retinopathy of both eyes 11/01/2015  . Chronic iridocyclitis of both eyes 10/13/2015  . Nuclear sclerotic cataract of both eyes 10/13/2015  . Epiretinal membrane (ERM) of right eye 10/13/2015  . Lumbosacral radiculitis 09/01/2014  . Routine gynecological examination 11/19/2011  . Abdominal or pelvic swelling, mass, or lump, generalized 11/19/2011  . Overweight (BMI 25.0-29.9) 11/19/2011  . Fibroids 11/19/2011  . HTN (hypertension) 11/19/2011  . Condyloma 11/19/2011    Past Medical History:  Diagnosis Date  . Allergy    seasonal  . Arthritis    osteoarthritis/ddd  . Hypertension     Family History  Problem Relation Age of Onset  . Colon cancer Brother   . Dementia Mother   . Hypotension Mother   . Stroke Father   . Breast cancer Sister   . Hypertension Sister   . Thyroid disease Sister   . Lupus  Paternal Aunt   . Sleep apnea Brother   . Stroke Brother   . Lupus Niece    Past Surgical History:  Procedure Laterality Date  . CARPAL TUNNEL RELEASE  2002   right  . COLONOSCOPY    . [place] SURGERY  2006  . LUMBAR FUSION  2011  . [place] SURGERY  2002  . TUBAL LIGATION  07/02/2012    Procedure: BILATERAL TUBAL LIGATION;  Surgeon: Ena Dawley, MD;  Location: [facility];  Service: Gynecology;  Laterality: Bilateral;  . UPPER GASTROINTESTINAL ENDOSCOPY    . VAGINAL HYSTERECTOMY  07/02/2012   Procedure: HYSTERECTOMY VAGINAL;  Surgeon: Ena Dawley, MD;  Location: [facility];  Service: Gynecology;  Laterality: N/A;   Social History   Social History Narrative  . Not on file   Immunization History  Administered Date(s) Administered  . Influenza Split 07/03/2012  . PFIZER SARS-COV-2 Vaccination 10/13/2019, 11/03/2019     Objective: Vital Signs: BP 122/65 (BP Location: Left Arm, Patient Position: Sitting, Cuff Size: Small)   Pulse 82   Ht 5\' 3"  (1.6 m)   Wt 186 lb 3.2 oz (84.5 kg)   BMI 32.98 kg/m    Physical Exam HENT:     Right Ear: External ear normal.     Left Ear: External ear normal.     Mouth/Throat:     Mouth: Mucous membranes are moist.     Pharynx: Oropharynx is clear.  Eyes:     Comments: Mild injection on eyes bilaterally no ulcers or lesions or synechiae, no palpable swelling of lacrimal glands  Cardiovascular:     Rate and Rhythm: Normal rate and regular rhythm.  Pulmonary:     Effort: Pulmonary effort is normal.     Breath sounds: Normal breath sounds.  Skin:    General: Skin is warm and dry.     Findings: No rash.  Neurological:     Mental Status: She is alert.      Musculoskeletal Exam:  Neck full range of motion Shoulder, elbow, wrist, fingers full range of motion no tenderness or swelling Normal hip internal and external rotation without pain Knees, ankles full range of motion no swelling  CDAI Exam: CDAI Score: -- Patient Global: --; Provider Global: -- Swollen: --; Tender: -- Joint Exam 06/07/2020   No joint exam has been documented for this visit   There is currently no information documented on the homunculus. Go to the Rheumatology activity and complete the homunculus joint exam.  Investigation: No additional  findings.  Imaging: No results found.  Recent Labs: Lab Results  Component Value Date   WBC 7.2 06/07/2020   HGB 13.9 06/07/2020   PLT 355 06/07/2020   NA 139 06/07/2020   K 4.5 06/07/2020   CL 103 06/07/2020   CO2 30 06/07/2020   GLUCOSE 76 06/07/2020   BUN 11 06/07/2020   CREATININE 0.96 06/07/2020   BILITOT 0.6 06/07/2020   ALKPHOS 55 01/22/2010   AST 23 06/07/2020   ALT 20 06/07/2020   PROT 7.8 06/07/2020   ALBUMIN 2.7 (L) 01/22/2010   CALCIUM 10.0 06/07/2020   GFRAA 74 06/07/2020   QFTBGOLDPLUS NEGATIVE 06/07/2020    Speciality Comments: No specialty comments available.  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  She does not have obvious findings for a related systemic connective tissue disease associated with this currently.  Currently she has no significant loss of visual acuity or blind spots no evidence of ulceration or lesions on exam.  We will plan on restarting her previous DMARD regimen which was working well and discontinued due to disease remission.  In the meantime we will prescribe a prednisone taper starting at 40 mg decrease down to 10 mg by follow-up in 4 weeks.  High risk medication use - Plan: CBC with Differential/Platelet, COMPLETE METABOLIC PANEL WITH GFR, Hepatitis panel, acute, QuantiFERON-TB Gold Plus, DG Chest 2 View  Anticipate starting methotrexate will obtain CBC CMP hepatitis panel QuantiFERON chest x-ray.  Discussed risk and benefits the patient including lung inflammation, liver toxicity, cytopenias she expressed understanding this familiar with  the medication due to previous use agrees with this treatment plan.  Chronic iridocyclitis of both eyes - Plan: predniSONE (DELTASONE) 10 MG tablet  Is not entirely clear if this is relapse of the exact same condition or new process since previously reported as iridocyclitis but now with scleritis but similar DMARD treatment is reasonable to try at this time.  Orders: Orders Placed This Encounter  Procedures  . DG Chest 2 View  . Sedimentation rate  . C-reactive protein  . CBC with Differential/Platelet  . COMPLETE METABOLIC PANEL WITH GFR  . Hepatitis panel, acute  . QuantiFERON-TB Gold Plus   Meds ordered this encounter  Medications  . predniSONE (DELTASONE) 10 MG tablet    Sig: Take 4 tablets (40 mg total) by mouth daily with breakfast for 7 days, THEN 3 tablets (30 mg total) daily with breakfast for 7 days, THEN 2 tablets (20 mg total) daily with breakfast for 7 days, THEN 1 tablet (10 mg total) daily with breakfast for 7 days.    Dispense:  80 tablet    Refill:  0     Follow-Up Instructions: Return in about 4 weeks (around 07/05/2020) for Scleritis f/u after methotrexate start and prednisone tapering.   Collier Salina, MD  Note - This record has been created using [employer].  Chart creation errors have been sought, but may not always  have been located. Such creation errors do not reflect on  the standard of medical care.

## 2020-06-07 ENCOUNTER — Ambulatory Visit: Payer: BLUE CROSS/BLUE SHIELD | Admitting: Internal Medicine

## 2020-06-07 ENCOUNTER — Encounter: Payer: Self-pay | Admitting: Internal Medicine

## 2020-06-07 ENCOUNTER — Other Ambulatory Visit: Payer: Self-pay

## 2020-06-07 VITALS — BP 122/65 | HR 82 | Ht 63.0 in | Wt 186.2 lb

## 2020-06-07 DIAGNOSIS — H15002 Unspecified scleritis, left eye: Secondary | ICD-10-CM | POA: Diagnosis not present

## 2020-06-07 DIAGNOSIS — Z79899 Other long term (current) drug therapy: Secondary | ICD-10-CM

## 2020-06-07 DIAGNOSIS — H2013 Chronic iridocyclitis, bilateral: Secondary | ICD-10-CM | POA: Diagnosis not present

## 2020-06-07 DIAGNOSIS — H15003 Unspecified scleritis, bilateral: Secondary | ICD-10-CM | POA: Insufficient documentation

## 2020-06-07 LAB — CBC WITH DIFFERENTIAL/PLATELET: Hemoglobin: 13.9 g/dL (ref 11.7–15.5)

## 2020-06-07 MED ORDER — PREDNISONE 10 MG PO TABS: ORAL_TABLET | ORAL | 0 refills | Status: DC

## 2020-06-07 NOTE — Patient Instructions (Addendum)
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  Recommend start at 40 mg (4 tablets) take for 1 week then decrease by 1 tablet/day each week down to 10 mg daily.  We will also contact you after seeing your lab results about starting methotrexate.  Some individuals do not require long-term immunosuppression for this type of eye inflammation but based on your previous history of incomplete response to steroids and recurrence of disease we will plan to go ahead with starting this as a steroid sparing medicine.  We will need to follow-up within about 4 weeks to check blood work after starting this medicine we can also discuss symptom control with the steroids.   Prednisone tablets What is this medicine? PREDNISONE (PRED ni sone) is a corticosteroid. It is commonly used to treat inflammation of the skin, joints, lungs, and other organs. Common conditions treated include asthma, allergies, and arthritis. It is also used for other conditions, such as blood disorders and diseases of the adrenal glands. This medicine may be used for other purposes; ask your health care provider or pharmacist if you have questions. COMMON BRAND NAME(S): Deltasone, Predone, Sterapred, Sterapred DS What should I tell my health care provider before I take this medicine? They need to know if you have any of these conditions:  Cushing's syndrome  diabetes  glaucoma  heart disease  high blood pressure  infection (especially a virus infection such as chickenpox, cold sores, or herpes)  kidney disease  liver disease  mental illness  myasthenia gravis  osteoporosis  seizures  stomach or intestine problems  thyroid disease  an unusual or allergic reaction to lactose, prednisone, other medicines, foods, dyes, or preservatives  pregnant or trying to get pregnant  breast-feeding How should I use this medicine? Take this medicine by  mouth with a glass of water. Follow the directions on the prescription label. Take this medicine with food. If you are taking this medicine once a day, take it in the morning. Do not take more medicine than you are told to take. Do not suddenly stop taking your medicine because you may develop a severe reaction. Your doctor will tell you how much medicine to take. If your doctor wants you to stop the medicine, the dose may be slowly lowered over time to avoid any side effects. Talk to your pediatrician regarding the use of this medicine in children. Special care may be needed. Overdosage: If you think you have taken too much of this medicine contact a poison control center or emergency room at once. NOTE: This medicine is only for you. Do not share this medicine with others. What if I miss a dose? If you miss a dose, take it as soon as you can. If it is almost time for your next dose, talk to your doctor or health care professional. You may need to miss a dose or take an extra dose. Do not take double or extra doses without advice. What may interact with this medicine? Do not take this medicine with any of the following medications:  metyrapone  mifepristone This medicine may also interact with the following medications:  aminoglutethimide  amphotericin B  aspirin and aspirin-like medicines  barbiturates  certain medicines for diabetes, like glipizide or glyburide  cholestyramine  cholinesterase inhibitors  cyclosporine  digoxin  diuretics  ephedrine  female hormones, like estrogens and birth control pills  isoniazid  ketoconazole  NSAIDS, medicines for pain and  inflammation, like ibuprofen or naproxen  phenytoin  rifampin  toxoids  vaccines  warfarin This list may not describe all possible interactions. Give your health care provider a list of all the medicines, herbs, non-prescription drugs, or dietary supplements you use. Also tell them if you smoke, drink  alcohol, or use illegal drugs. Some items may interact with your medicine. What should I watch for while using this medicine? Visit your doctor or health care professional for regular checks on your progress. If you are taking this medicine over a prolonged period, carry an identification card with your name and address, the type and dose of your medicine, and your doctor's name and address. This medicine may increase your risk of getting an infection. Tell your doctor or health care professional if you are around anyone with measles or chickenpox, or if you develop sores or blisters that do not heal properly. If you are going to have surgery, tell your doctor or health care professional that you have taken this medicine within the last twelve months. Ask your doctor or health care professional about your diet. You may need to lower the amount of salt you eat. This medicine may increase blood sugar. Ask your healthcare provider if changes in diet or medicines are needed if you have diabetes. What side effects may I notice from receiving this medicine? Side effects that you should report to your doctor or health care professional as soon as possible:  allergic reactions like skin rash, itching or hives, swelling of the face, lips, or tongue  changes in emotions or moods  changes in vision  depressed mood  eye pain  fever or chills, cough, sore throat, pain or difficulty passing urine  signs and symptoms of high blood sugar such as being more thirsty or hungry or having to urinate more than normal. You may also feel very tired or have blurry vision.  swelling of ankles, feet Side effects that usually do not require medical attention (report to your doctor or health care professional if they continue or are bothersome):  confusion, excitement, restlessness  headache  nausea, vomiting  skin problems, acne, thin and shiny skin  trouble sleeping  weight gain This list may not describe  all possible side effects. Call your doctor for medical advice about side effects. You may report side effects to FDA at 1-800-FDA-1088. Where should I keep my medicine? Keep out of the reach of children. Store at room temperature between 15 and 30 degrees C (59 and 86 degrees F). Protect from light. Keep container tightly closed. Throw away any unused medicine after the expiration date. NOTE: This sheet is a summary. It may not cover all possible information. If you have questions about this medicine, talk to your doctor, pharmacist, or health care provider.  2020 Elsevier/Gold Standard (2018-04-15 10:54:22)    Methotrexate tablets What is this medicine? METHOTREXATE (METH oh TREX ate) is a chemotherapy drug used to treat cancer including breast cancer, leukemia, and lymphoma. This medicine can also be used to treat psoriasis and certain kinds of arthritis. This medicine may be used for other purposes; ask your health care provider or pharmacist if you have questions. COMMON BRAND NAME(S): Rheumatrex, Trexall What should I tell my health care provider before I take this medicine? They need to know if you have any of these conditions:  fluid in the stomach area or lungs  if you often drink alcohol  infection or immune system problems  kidney disease or on hemodialysis  liver disease  low blood counts, like low white cell, platelet, or red cell counts  lung disease  radiation therapy  stomach ulcers  ulcerative colitis  an unusual or allergic reaction to methotrexate, other medicines, foods, dyes, or preservatives  pregnant or trying to get pregnant  breast-feeding How should I use this medicine? Take this medicine by mouth with a glass of water. Follow the directions on the prescription label. Take your medicine at regular intervals. Do not take it more often than directed. Do not stop taking except on your doctor's advice. Make sure you know why you are taking this  medicine and how often you should take it. If this medicine is used for a condition that is not cancer, like arthritis or psoriasis, it should be taken weekly, NOT daily. Taking this medicine more often than directed can cause serious side effects, even death. Talk to your healthcare provider about safe handling and disposal of this medicine. You may need to take special precautions. Talk to your pediatrician regarding the use of this medicine in children. While this drug may be prescribed for selected conditions, precautions do apply. Overdosage: If you think you have taken too much of this medicine contact a poison control center or emergency room at once. NOTE: This medicine is only for you. Do not share this medicine with others. What if I miss a dose? If you miss a dose, talk with your doctor or health care professional. Do not take double or extra doses. What may interact with this medicine? This medicine may interact with the following medication:  acitretin  aspirin and aspirin-like medicines including salicylates  azathioprine  certain antibiotics like penicillins, tetracycline, and chloramphenicol  cyclosporine  gold  hydroxychloroquine  live virus vaccines  NSAIDs, medicines for pain and inflammation, like ibuprofen or naproxen  other cytotoxic agents  penicillamine  phenylbutazone  phenytoin  probenecid  retinoids such as isotretinoin and tretinoin  steroid medicines like prednisone or cortisone  sulfonamides like sulfasalazine and trimethoprim/sulfamethoxazole  theophylline This list may not describe all possible interactions. Give your health care provider a list of all the medicines, herbs, non-prescription drugs, or dietary supplements you use. Also tell them if you smoke, drink alcohol, or use illegal drugs. Some items may interact with your medicine. What should I watch for while using this medicine? Avoid alcoholic drinks. This medicine can make you  more sensitive to the sun. Keep out of the sun. If you cannot avoid being in the sun, wear protective clothing and use sunscreen. Do not use sun lamps or tanning beds/booths. You may need blood work done while you are taking this medicine. Call your doctor or health care professional for advice if you get a fever, chills or sore throat, or other symptoms of a cold or flu. Do not treat yourself. This drug decreases your body's ability to fight infections. Try to avoid being around people who are sick. This medicine may increase your risk to bruise or bleed. Call your doctor or health care professional if you notice any unusual bleeding. Check with your doctor or health care professional if you get an attack of severe diarrhea, nausea and vomiting, or if you sweat a lot. The loss of too much body fluid can make it dangerous for you to take this medicine. Talk to your doctor about your risk of cancer. You may be more at risk for certain types of cancers if you take this medicine. Both men and women must use effective birth control  with this medicine. Do not become pregnant while taking this medicine or until at least 1 normal menstrual cycle has occurred after stopping it. Women should inform their doctor if they wish to become pregnant or think they might be pregnant. Men should not father a child while taking this medicine and for 3 months after stopping it. There is a potential for serious side effects to an unborn child. Talk to your health care professional or pharmacist for more information. Do not breast-feed an infant while taking this medicine. What side effects may I notice from receiving this medicine? Side effects that you should report to your doctor or health care professional as soon as possible:  allergic reactions like skin rash, itching or hives, swelling of the face, lips, or tongue  breathing problems or shortness of breath  diarrhea  dry, nonproductive cough  low blood counts -  this medicine may decrease the number of white blood cells, red blood cells and platelets. You may be at increased risk for infections and bleeding.  mouth sores  redness, blistering, peeling or loosening of the skin, including inside the mouth  signs of infection - fever or chills, cough, sore throat, pain or trouble passing urine  signs and symptoms of bleeding such as bloody or black, tarry stools; red or dark-brown urine; spitting up blood or brown material that looks like coffee grounds; red spots on the skin; unusual bruising or bleeding from the eye, gums, or nose  signs and symptoms of kidney injury like trouble passing urine or change in the amount of urine  signs and symptoms of liver injury like dark yellow or brown urine; general ill feeling or flu-like symptoms; light-colored stools; loss of appetite; nausea; right upper belly pain; unusually weak or tired; yellowing of the eyes or skin Side effects that usually do not require medical attention (report to your doctor or health care professional if they continue or are bothersome):  dizziness  hair loss  tiredness  upset stomach  vomiting This list may not describe all possible side effects. Call your doctor for medical advice about side effects. You may report side effects to FDA at 1-800-FDA-1088. Where should I keep my medicine? Keep out of the reach of children. Store at room temperature between 20 and 25 degrees C (68 and 77 degrees F). Protect from light. Throw away any unused medicine after the expiration date. NOTE: This sheet is a summary. It may not cover all possible information. If you have questions about this medicine, talk to your doctor, pharmacist, or health care provider.  2020 Elsevier/Gold Standard (2017-03-07 13:38:43)

## 2020-06-08 LAB — CBC WITH DIFFERENTIAL/PLATELET: Neutrophils Relative %: 65.2 %

## 2020-06-08 LAB — COMPLETE METABOLIC PANEL WITH GFR: BUN: 11 mg/dL (ref 7–25)

## 2020-06-08 LAB — SEDIMENTATION RATE: Sed Rate: 19 mm/h (ref 0–30)

## 2020-06-09 LAB — QUANTIFERON-TB GOLD PLUS
Mitogen-NIL: 9.06 IU/mL
NIL: 0.03 IU/mL
QuantiFERON-TB Gold Plus: NEGATIVE
TB1-NIL: 0 IU/mL
TB2-NIL: 0 IU/mL

## 2020-06-09 LAB — COMPLETE METABOLIC PANEL WITH GFR
AG Ratio: 1.4 (calc) (ref 1.0–2.5)
ALT: 20 U/L (ref 6–29)
AST: 23 U/L (ref 10–35)
Albumin: 4.5 g/dL (ref 3.6–5.1)
Alkaline phosphatase (APISO): 58 U/L (ref 37–153)
CO2: 30 mmol/L (ref 20–32)
Calcium: 10 mg/dL (ref 8.6–10.4)
Chloride: 103 mmol/L (ref 98–110)
Creat: 0.96 mg/dL (ref 0.50–0.99)
GFR, Est African American: 74 mL/min/{1.73_m2} (ref >=60)
GFR, Est Non African American: 64 mL/min/{1.73_m2} (ref >=60)
Globulin: 3.3 g/dL (calc) (ref 1.9–3.7)
Glucose, Bld: 76 mg/dL (ref 65–99)
Potassium: 4.5 mmol/L (ref 3.5–5.3)
Sodium: 139 mmol/L (ref 135–146)
Total Bilirubin: 0.6 mg/dL (ref 0.2–1.2)
Total Protein: 7.8 g/dL (ref 6.1–8.1)

## 2020-06-09 LAB — HEPATITIS PANEL, ACUTE
Hep A IgM: NONREACTIVE
Hep B C IgM: NONREACTIVE
Hepatitis B Surface Ag: NONREACTIVE
Hepatitis C Ab: NONREACTIVE
SIGNAL TO CUT-OFF: 0.01 (ref <1.00)

## 2020-06-09 LAB — CBC WITH DIFFERENTIAL/PLATELET
Absolute Monocytes: 677 cells/uL (ref 200–950)
Basophils Absolute: 58 cells/uL (ref 0–200)
Basophils Relative: 0.8 %
Eosinophils Absolute: 58 cells/uL (ref 15–500)
Eosinophils Relative: 0.8 %
HCT: 41 % (ref 35.0–45.0)
Lymphs Abs: 1714 cells/uL (ref 850–3900)
MCH: 32.9 pg (ref 27.0–33.0)
MCHC: 33.9 g/dL (ref 32.0–36.0)
MCV: 96.9 fL (ref 80.0–100.0)
MPV: 9.5 fL (ref 7.5–12.5)
Monocytes Relative: 9.4 %
Neutro Abs: 4694 cells/uL (ref 1500–7800)
Platelets: 355 10*3/uL (ref 140–400)
RBC: 4.23 10*6/uL (ref 3.80–5.10)
RDW: 12.2 % (ref 11.0–15.0)
Total Lymphocyte: 23.8 %
WBC: 7.2 10*3/uL (ref 3.8–10.8)

## 2020-06-09 LAB — C-REACTIVE PROTEIN: CRP: 6.5 mg/L (ref <8.0)

## 2020-06-13 ENCOUNTER — Other Ambulatory Visit: Payer: Self-pay

## 2020-06-13 ENCOUNTER — Ambulatory Visit
Admission: RE | Admit: 2020-06-13 | Discharge: 2020-06-13 | Disposition: A | Discharge status: Home / Self Care | Payer: BC Managed Care – PPO | Source: Ambulatory Visit | Attending: Internal Medicine | Admitting: Internal Medicine

## 2020-06-13 DIAGNOSIS — Z5181 Encounter for therapeutic drug level monitoring: Secondary | ICD-10-CM | POA: Diagnosis not present

## 2020-06-13 DIAGNOSIS — M47814 Spondylosis without myelopathy or radiculopathy, thoracic region: Secondary | ICD-10-CM | POA: Diagnosis not present

## 2020-06-13 DIAGNOSIS — Z79899 Other long term (current) drug therapy: Secondary | ICD-10-CM

## 2020-06-15 MED ORDER — METHOTREXATE 2.5 MG PO TABS: 15.0000 mg | ORAL_TABLET | ORAL | 0 refills | Status: AC

## 2020-06-15 MED ORDER — FOLIC ACID 1 MG PO TABS: 1.0000 mg | ORAL_TABLET | Freq: Every day | ORAL | 0 refills | Status: DC

## 2020-06-15 NOTE — Addendum Note (Signed)
Addended by: Collier Salina on: 06/15/2020 08:24 AM   Modules accepted: Orders

## 2020-06-15 NOTE — Progress Notes (Signed)
Labs normal including CBC, CMP, hepatitis panel and chest xray unremarkable so will go ahead with ordering methotrexate and discussed with Kari Williams. She plans to start this maybe Thursday. 15mg  PO weekly and also folic acid 1mg  PO daily we will follow up next month for labs and assessment.

## 2020-06-17 DIAGNOSIS — I10 Essential (primary) hypertension: Secondary | ICD-10-CM | POA: Diagnosis not present

## 2020-06-17 DIAGNOSIS — H35371 Puckering of macula, right eye: Secondary | ICD-10-CM | POA: Diagnosis not present

## 2020-06-17 DIAGNOSIS — H2513 Age-related nuclear cataract, bilateral: Secondary | ICD-10-CM | POA: Diagnosis not present

## 2020-06-17 DIAGNOSIS — H15002 Unspecified scleritis, left eye: Secondary | ICD-10-CM | POA: Diagnosis not present

## 2020-07-05 ENCOUNTER — Ambulatory Visit: Payer: BC Managed Care – PPO | Admitting: Internal Medicine

## 2020-07-05 ENCOUNTER — Encounter: Payer: Self-pay | Admitting: Internal Medicine

## 2020-07-05 ENCOUNTER — Other Ambulatory Visit: Payer: Self-pay

## 2020-07-05 VITALS — BP 117/71 | HR 80 | Ht 63.0 in | Wt 190.0 lb

## 2020-07-05 DIAGNOSIS — H15003 Unspecified scleritis, bilateral: Secondary | ICD-10-CM

## 2020-07-05 DIAGNOSIS — H2013 Chronic iridocyclitis, bilateral: Secondary | ICD-10-CM | POA: Diagnosis not present

## 2020-07-05 DIAGNOSIS — H2513 Age-related nuclear cataract, bilateral: Secondary | ICD-10-CM

## 2020-07-05 DIAGNOSIS — H15002 Unspecified scleritis, left eye: Secondary | ICD-10-CM

## 2020-07-05 DIAGNOSIS — Z79899 Other long term (current) drug therapy: Secondary | ICD-10-CM | POA: Diagnosis not present

## 2020-07-05 NOTE — Progress Notes (Signed)
Office Visit Note  Patient: Kari Williams             Date of Birth: 07-11-1961           MRN: [id number]             PCP: Seward Carol, MD Referring: Seward Carol, MD Visit Date: 07/05/2020  Subjective:   History of Present Illness: RAFAEL QUESADA is a 59 y.o. female  Increased eye pain, blurriness in mornings bilaterally. Saw Dr. Manuella Ghazi 06/17/20 with active scleritis bilaterally worse than on her previous exam. No problems tolerating methotrexate and prednisone so far. She has decreased to 10mg  daily dose prednisone. Eyes are bothering her a lot feels like something is in them constantly.   Review of Systems  Constitutional: Negative for fatigue.  HENT: Positive for mouth dryness and nose dryness. Negative for mouth sores.   Eyes: Positive for pain, redness, itching, visual disturbance and dryness.  Respiratory: Negative for cough, hemoptysis, shortness of breath and difficulty breathing.   Cardiovascular: Negative for chest pain, palpitations and swelling in legs/feet.  Gastrointestinal: Negative for abdominal pain, blood in stool, constipation and diarrhea.  Endocrine: Negative for increased urination.  Genitourinary: Negative for painful urination.  Musculoskeletal: Positive for arthralgias and joint pain. Negative for joint swelling, myalgias, muscle weakness, morning stiffness, muscle tenderness and myalgias.  Skin: Negative for color change, rash and redness.  Allergic/Immunologic: Negative for susceptible to infections.  Neurological: Negative for dizziness, numbness, headaches, memory loss and weakness.  Hematological: Negative for swollen glands.  Psychiatric/Behavioral: Negative for confusion.    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   High risk medication use 06/07/2020   Carpal tunnel syndrome 11/29/2015   Hypertensive retinopathy of both eyes 11/01/2015   Nuclear sclerotic cataract  of both eyes 10/13/2015   Epiretinal membrane (ERM) of right eye 10/13/2015   Lumbosacral radiculitis 09/01/2014   Routine gynecological examination 11/19/2011   Abdominal or pelvic swelling, mass, or lump, generalized 11/19/2011   Overweight (BMI 25.0-29.9) 11/19/2011   Fibroids 11/19/2011   HTN (hypertension) 11/19/2011   Condyloma 11/19/2011    Past Medical History:  Diagnosis Date   Allergy    seasonal   Arthritis    osteoarthritis/ddd   Hypertension     Family History  Problem Relation Age of Onset   Colon cancer Brother    Dementia Mother    Hypotension Mother    Stroke Father    Breast cancer Sister    Hypertension Sister    Thyroid disease Sister    Lupus Paternal Aunt    Sleep apnea Brother    Stroke Brother    Lupus Niece    Past Surgical History:  Procedure Laterality Date   CARPAL TUNNEL RELEASE  2002   right   COLONOSCOPY     LUMBAR [place]  2006   LUMBAR FUSION  2011   THORACIC [place]  2002   TUBAL LIGATION  07/02/2012   Procedure: BILATERAL TUBAL LIGATION;  Surgeon: Ena Dawley, MD;  Location: [facility];  Service: Gynecology;  Laterality: Bilateral;   UPPER GASTROINTESTINAL ENDOSCOPY     VAGINAL HYSTERECTOMY  07/02/2012   Procedure: HYSTERECTOMY VAGINAL;  Surgeon: Ena Dawley, MD;  Location: [facility];  Service: Gynecology;  Laterality: N/A;   Social History   Social History Narrative   Not on file   Immunization History  Administered Date(s) Administered   Influenza Split 07/03/2012  PFIZER SARS-COV-2 Vaccination 10/13/2019, 11/03/2019     Objective: Vital Signs: BP 117/71 (BP Location: Right Arm, Patient Position: Sitting, Cuff Size: Small)    Pulse 80    Ht 5\' 3"  (1.6 m)    Wt 190 lb (86.2 kg)    BMI 33.66 kg/m    Physical Exam Eyes:     General: Scleral icterus present.  Skin:    General: Skin is warm and dry.     Findings: No rash.  Neurological:     Mental Status: She is alert.       Investigation: No additional findings.  Imaging: DG Chest 2 View  Result Date: 06/14/2020 CLINICAL DATA:  Baseline for methotrexate monitoring. EXAM: CHEST - 2 VIEW COMPARISON:  07/18/2016. FINDINGS: Mediastinum and hilar structures normal. Heart size normal. No pulmonary venous congestion. No focal infiltrate. No pleural effusion or pneumothorax. Degenerative change thoracic spine. No acute bony abnormality identified. IMPRESSION: No acute cardiopulmonary disease.  Chest is stable from prior exam. Electronically Signed   By: Marcello Moores  Register   On: 06/14/2020 08:10    Recent Labs: Lab Results  Component Value Date   WBC 10.1 07/05/2020   HGB 13.4 07/05/2020   PLT 344 07/05/2020   NA 139 07/05/2020   K 4.3 07/05/2020   CL 103 07/05/2020   CO2 26 07/05/2020   GLUCOSE 96 07/05/2020   BUN 11 07/05/2020   CREATININE 0.79 07/05/2020   BILITOT 0.6 07/05/2020   ALKPHOS 55 01/22/2010   AST 20 07/05/2020   ALT 19 07/05/2020   PROT 7.4 07/05/2020   PROT 7.4 07/05/2020   ALBUMIN 2.7 (L) 01/22/2010   CALCIUM 9.1 07/05/2020   GFRAA 94 07/05/2020   QFTBGOLDPLUS NEGATIVE 06/07/2020    Speciality Comments: No specialty comments available.  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 Manuella Ghazi. Will obtain baseline labs today for this. Continue methotrexate 15mg  PO weekly, folic acid 1mg  daily, prednisone 10mg  daily for now.  High risk medication use - Plan: HIV Antibody (routine testing w rflx), Serum protein electrophoresis with reflex,  IgG, IgA, IgM, CBC with Differential/Platelet, COMPLETE METABOLIC PANEL WITH GFR  Checking SPEP, HIV, immunoglobulins, CBC, CMP for new TNF treatment start. Discussed side effect and risks of medication including injection site reaction, infection, liver function changes, cytopenia, lymphoma, or nonmelanoma skin cancers and information provided to review for new start.  Orders: Orders Placed This Encounter  Procedures   HIV Antibody (routine testing w rflx)   Serum protein electrophoresis with reflex   IgG, IgA, IgM   CBC with Differential/Platelet   COMPLETE METABOLIC PANEL WITH GFR   Meds ordered this encounter  Medications   predniSONE (DELTASONE) 10 MG tablet    Sig: Take 1 tablet (10 mg total) by mouth daily with breakfast.    Dispense:  30 tablet    Refill:  0    Follow-Up Instructions: Return in about 1 week (around 07/12/2020) for New TNF start.   Collier Salina, MD  Note - This record has been created using [employer].  Chart creation errors have been sought, but  may not always  have been located. Such creation errors do not reflect on  the standard of medical care.

## 2020-07-07 LAB — COMPLETE METABOLIC PANEL WITHOUT GFR
AG Ratio: 1.5 (calc) (ref 1.0–2.5)
ALT: 19 U/L (ref 6–29)
AST: 20 U/L (ref 10–35)
Albumin: 4.4 g/dL (ref 3.6–5.1)
Alkaline phosphatase (APISO): 53 U/L (ref 37–153)
BUN: 11 mg/dL (ref 7–25)
CO2: 26 mmol/L (ref 20–32)
Calcium: 9.1 mg/dL (ref 8.6–10.4)
Chloride: 103 mmol/L (ref 98–110)
Creat: 0.79 mg/dL (ref 0.50–0.99)
GFR, Est African American: 94 mL/min/1.73m2
GFR, Est Non African American: 81 mL/min/1.73m2
Globulin: 3 g/dL (ref 1.9–3.7)
Glucose, Bld: 96 mg/dL (ref 65–99)
Potassium: 4.3 mmol/L (ref 3.5–5.3)
Sodium: 139 mmol/L (ref 135–146)
Total Bilirubin: 0.6 mg/dL (ref 0.2–1.2)
Total Protein: 7.4 g/dL (ref 6.1–8.1)

## 2020-07-07 LAB — IGG, IGA, IGM
IgG (Immunoglobin G), Serum: 1508 mg/dL (ref 600–1540)
IgM, Serum: 77 mg/dL (ref 50–300)
Immunoglobulin A: 291 mg/dL (ref 70–320)

## 2020-07-07 LAB — CBC WITH DIFFERENTIAL/PLATELET
Absolute Monocytes: 586 cells/uL (ref 200–950)
Basophils Absolute: 51 cells/uL (ref 0–200)
Basophils Relative: 0.5 %
Eosinophils Absolute: 71 cells/uL (ref 15–500)
Eosinophils Relative: 0.7 %
HCT: 39 % (ref 35.0–45.0)
Hemoglobin: 13.4 g/dL (ref 11.7–15.5)
Lymphs Abs: 2060 cells/uL (ref 850–3900)
MCH: 33.8 pg — ABNORMAL HIGH (ref 27.0–33.0)
MCHC: 34.4 g/dL (ref 32.0–36.0)
MCV: 98.2 fL (ref 80.0–100.0)
MPV: 9.3 fL (ref 7.5–12.5)
Monocytes Relative: 5.8 %
Neutro Abs: 7333 cells/uL (ref 1500–7800)
Neutrophils Relative %: 72.6 %
Platelets: 344 10*3/uL (ref 140–400)
RBC: 3.97 10*6/uL (ref 3.80–5.10)
RDW: 12.5 % (ref 11.0–15.0)
Total Lymphocyte: 20.4 %
WBC: 10.1 10*3/uL (ref 3.8–10.8)

## 2020-07-07 LAB — PROTEIN ELECTROPHORESIS, SERUM, WITH REFLEX
Albumin ELP: 4.3 g/dL (ref 3.8–4.8)
Alpha 1: 0.3 g/dL (ref 0.2–0.3)
Alpha 2: 0.7 g/dL (ref 0.5–0.9)
Beta 2: 0.4 g/dL (ref 0.2–0.5)
Beta Globulin: 0.4 g/dL (ref 0.4–0.6)
Gamma Globulin: 1.3 g/dL (ref 0.8–1.7)
Total Protein: 7.4 g/dL (ref 6.1–8.1)

## 2020-07-07 LAB — HIV ANTIBODY (ROUTINE TESTING W REFLEX): HIV 1&2 Ab, 4th Generation: NONREACTIVE

## 2020-07-07 MED ORDER — PREDNISONE 10 MG PO TABS: 10.0000 mg | ORAL_TABLET | Freq: Every day | ORAL | 0 refills | Status: AC

## 2020-07-12 ENCOUNTER — Ambulatory Visit (INDEPENDENT_AMBULATORY_CARE_PROVIDER_SITE_OTHER): Payer: BC Managed Care – PPO | Admitting: Internal Medicine

## 2020-07-12 ENCOUNTER — Encounter: Payer: Self-pay | Admitting: Internal Medicine

## 2020-07-12 ENCOUNTER — Other Ambulatory Visit: Payer: Self-pay

## 2020-07-12 VITALS — BP 125/74 | HR 77 | Ht 63.0 in | Wt 194.2 lb

## 2020-07-12 DIAGNOSIS — H15003 Unspecified scleritis, bilateral: Secondary | ICD-10-CM | POA: Diagnosis not present

## 2020-07-12 DIAGNOSIS — H2013 Chronic iridocyclitis, bilateral: Secondary | ICD-10-CM | POA: Diagnosis not present

## 2020-07-12 NOTE — Progress Notes (Signed)
Pharmacy Note  Subjective:   Patient presents to clinic today to receive 40 mg/0.4 mL of Humira Pen (Adalimumab).  Patient running a fever or have signs/symptoms of infection? No  Patient currently on antibiotics for the treatment of infection? No  Patient have any upcoming invasive procedures/surgeries? No  Objective: CMP     Component Value Date/Time   NA 139 07/05/2020 1349   K 4.3 07/05/2020 1349   CL 103 07/05/2020 1349   CO2 26 07/05/2020 1349   GLUCOSE 96 07/05/2020 1349   BUN 11 07/05/2020 1349   CREATININE 0.79 07/05/2020 1349   CALCIUM 9.1 07/05/2020 1349   PROT 7.4 07/05/2020 1349   PROT 7.4 07/05/2020 1349   ALBUMIN 2.7 (L) 01/22/2010 0341   AST 20 07/05/2020 1349   ALT 19 07/05/2020 1349   ALKPHOS 55 01/22/2010 0341   BILITOT 0.6 07/05/2020 1349   GFRNONAA 81 07/05/2020 1349   GFRAA 94 07/05/2020 1349    CBC    Component Value Date/Time   WBC 10.1 07/05/2020 1349   RBC 3.97 07/05/2020 1349   HGB 13.4 07/05/2020 1349   HCT 39.0 07/05/2020 1349   PLT 344 07/05/2020 1349   MCV 98.2 07/05/2020 1349   MCH 33.8 (H) 07/05/2020 1349   MCHC 34.4 07/05/2020 1349   RDW 12.5 07/05/2020 1349   LYMPHSABS 2,060 07/05/2020 1349   MONOABS 1.1 (H) 01/20/2010 0530   EOSABS 71 07/05/2020 1349   BASOSABS 51 07/05/2020 1349    Baseline Immunosuppressant Therapy Labs TB GOLD Quantiferon TB Gold Latest Ref Rng & Units 06/07/2020  Quantiferon TB Gold Plus NEGATIVE NEGATIVE   Hepatitis Panel Hepatitis Latest Ref Rng & Units 06/07/2020  Hep B Surface Ag NON-REACTI NON-REACTIVE  Hep B IgM NON-REACTI NON-REACTIVE  Hep C Ab NON-REACTI NON-REACTIVE  Hep C Ab NON-REACTI NON-REACTIVE  Hep A IgM NON-REACTI NON-REACTIVE   HIV Lab Results  Component Value Date   HIV NON-REACTIVE 07/05/2020   Immunoglobulins Immunoglobulin Electrophoresis Latest Ref Rng & Units 07/05/2020  IgA  70 - 320 mg/dL 291  IgG 600 - 1,540 mg/dL 1,508  IgM 50 - 300 mg/dL 77   SPEP Serum  Protein Electrophoresis Latest Ref Rng & Units 07/05/2020  Total Protein 6.1 - 8.1 g/dL 7.4  Albumin 3.8 - 4.8 g/dL -  Alpha-1 0.2 - 0.3 g/dL -  Alpha-2 0.5 - 0.9 g/dL -  Beta Globulin 0.4 - 0.6 g/dL -  Beta 2 0.2 - 0.5 g/dL -  Gamma Globulin 0.8 - 1.7 g/dL -   G6PD No results found for: G6PDH TPMT No results found for: TPMT   Chest x-ray: 06/13/2020 IMPRESSION: No acute cardiopulmonary disease.  Chest is stable from prior exam.   Assessment/Plan:  Demonstrated proper injection technique with Humira Pen demo device  Patient able to demonstrate proper injection technique using the teach back method.  Patient self injected in the right thigh with:  Sample Medication: Humira Pen (Adalimumab) NDC: 4536-4680-32 Lot: [id number] Expiration: Jan 2023  Patient tolerated well.  Observed for 30 mins in office for adverse reaction and patient did not display any adverse reaction.   Patient is to return in 1 month for labs and 6-8 weeks for follow-up appointment.  Standing orders placed.   All questions encouraged and answered.  Instructed patient to call with any further questions or concerns.   07/13/2020 8:02 AM

## 2020-07-12 NOTE — Patient Instructions (Signed)
I recommend taking the prednisone 10mg  daily for the next month then decrease to 5mg  daily with plan to stop after you've been on the Humira 8 weeks. Continue taking the methotrexate 15mg  weekly and any ophthalmic treatments from Dr. Manuella Ghazi. Please contact us if you notice any new symptoms or concerns after starting the medication.

## 2020-07-12 NOTE — Progress Notes (Deleted)
Office Visit Note  Patient: Kari Williams             Date of Birth: [birth year]           MRN: [id number]             PCP: Seward Carol, MD Referring: Seward Carol, MD Visit Date: 07/12/2020 Occupation: @GUAROCC @  Subjective:  No chief complaint on file.   History of Present Illness: Kari Williams is a 59 y.o. 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  HENT: Negative for mouth sores, mouth dryness and nose dryness.   Eyes: Positive for photophobia, pain, redness, itching, visual disturbance and dryness.  Respiratory: Negative for cough, hemoptysis, shortness of breath and difficulty breathing.   Cardiovascular: Negative for chest pain, palpitations and swelling in legs/feet.  Gastrointestinal: Negative for abdominal pain, blood in stool, constipation and diarrhea.  Endocrine: Negative for increased urination.  Genitourinary: Negative for painful urination.  Musculoskeletal: Negative for arthralgias, joint pain, joint swelling, myalgias, muscle weakness, morning stiffness, muscle tenderness and myalgias.  Skin: Negative for rash and redness.  Allergic/Immunologic: Negative for susceptible to infections.  Neurological: Negative for dizziness, numbness, headaches, memory loss and weakness.  Hematological: Negative for swollen glands.  Psychiatric/Behavioral: Positive for sleep disturbance. Negative for confusion.    PMFS History:  Patient Active Problem List   Diagnosis Date Noted  . 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 High risk medication use 06/07/2020  . Carpal tunnel syndrome 11/29/2015  . Hypertensive retinopathy of both eyes 11/01/2015  . Nuclear sclerotic cataract of both eyes 10/13/2015  . Epiretinal membrane (ERM) of right eye 10/13/2015  . Lumbosacral radiculitis 09/01/2014  . Routine gynecological  examination 11/19/2011  . Abdominal or pelvic swelling, mass, or lump, generalized 11/19/2011  . Overweight (BMI 25.0-29.9) 11/19/2011  . Fibroids 11/19/2011  . HTN (hypertension) 11/19/2011  . Condyloma 11/19/2011    Past Medical History:  Diagnosis Date  . Allergy    seasonal  . Arthritis    osteoarthritis/ddd  . Hypertension     Family History  Problem Relation Age of Onset  . Colon cancer Brother   . Dementia Mother   . Hypotension Mother   . Stroke Father   . Breast cancer Sister   . Hypertension Sister   . Thyroid disease Sister   . Lupus Paternal Aunt   . Sleep apnea Brother   . Stroke Brother   . Lupus Niece    Past Surgical History:  Procedure Laterality Date  . CARPAL TUNNEL RELEASE  2002   right  . COLONOSCOPY    . [place] SURGERY  2006  . LUMBAR FUSION  2011  . [place] SURGERY  2002  . TUBAL LIGATION  07/02/2012   Procedure: BILATERAL TUBAL LIGATION;  Surgeon: Ena Dawley, MD;  Location: [facility];  Service: Gynecology;  Laterality: Bilateral;  . UPPER GASTROINTESTINAL ENDOSCOPY    . VAGINAL HYSTERECTOMY  07/02/2012   Procedure: HYSTERECTOMY VAGINAL;  Surgeon: Ena Dawley, MD;  Location: [facility];  Service: Gynecology;  Laterality: N/A;   Social History   Social History Narrative  . Not on file   Immunization History  Administered Date(s) Administered  . Influenza Split 07/03/2012  . PFIZER SARS-COV-2 Vaccination 10/13/2019, 11/03/2019     Objective: Vital Signs: BP 123/78 (BP Location: Right Arm, Patient Position: Sitting, Cuff Size: Normal)  Pulse 70   Ht 5\' 3"  (1.6 m)   Wt 194 lb 3.2 oz (88.1 kg)   BMI 34.40 kg/m    Physical Exam   Musculoskeletal Exam: ***  CDAI Exam: CDAI Score: -- Patient Global: --; Provider Global: -- Swollen: --; Tender: -- Joint Exam 07/12/2020   No joint exam has been documented for this visit   There is currently no information documented on the homunculus. Go to the Rheumatology activity  and complete the homunculus joint exam.  Investigation: No additional findings.  Imaging: DG Chest 2 View  Result Date: 06/14/2020 CLINICAL DATA:  Baseline for methotrexate monitoring. EXAM: CHEST - 2 VIEW COMPARISON:  07/18/2016. FINDINGS: Mediastinum and hilar structures normal. Heart size normal. No pulmonary venous congestion. No focal infiltrate. No pleural effusion or pneumothorax. Degenerative change thoracic spine. No acute bony abnormality identified. IMPRESSION: No acute cardiopulmonary disease.  Chest is stable from prior exam. Electronically Signed   By: Marcello Moores  Register   On: 06/14/2020 08:10    Recent Labs: Lab Results  Component Value Date   WBC 10.1 07/05/2020   HGB 13.4 07/05/2020   PLT 344 07/05/2020   NA 139 07/05/2020   K 4.3 07/05/2020   CL 103 07/05/2020   CO2 26 07/05/2020   GLUCOSE 96 07/05/2020   BUN 11 07/05/2020   CREATININE 0.79 07/05/2020   BILITOT 0.6 07/05/2020   ALKPHOS 55 01/22/2010   AST 20 07/05/2020   ALT 19 07/05/2020   PROT 7.4 07/05/2020   PROT 7.4 07/05/2020   ALBUMIN 2.7 (L) 01/22/2010   CALCIUM 9.1 07/05/2020   GFRAA 94 07/05/2020   QFTBGOLDPLUS NEGATIVE 06/07/2020    Speciality Comments: No specialty comments available.  Procedures:  No procedures performed Allergies: Penicillins   Assessment / Plan:     Visit Diagnoses: No diagnosis found.  Orders: No orders of the defined types were placed in this encounter.  No orders of the defined types were placed in this encounter.   Face-to-face time spent with patient was *** minutes. Greater than 50% of time was spent in counseling and coordination of care.  Follow-Up Instructions: Return in about 2 months (around 09/12/2020).   Collier Salina, MD  Note - This record has been created using [employer].  Chart creation errors have been sought, but may not always  have been located. Such creation errors do not reflect on  the standard of medical care.

## 2020-07-13 MED ORDER — HUMIRA 40 MG/0.8ML ~~LOC~~ PSKT: 40.0000 mg | PREFILLED_SYRINGE | SUBCUTANEOUS | 2 refills | Status: DC

## 2020-07-13 MED ORDER — METHOTREXATE 2.5 MG PO TABS: 15.0000 mg | ORAL_TABLET | ORAL | 0 refills | Status: DC

## 2020-07-13 NOTE — Addendum Note (Signed)
Addended by: Collier Salina on: 07/13/2020 08:05 AM   Modules accepted: Orders

## 2020-07-14 ENCOUNTER — Telehealth: Payer: Self-pay | Admitting: Pharmacist

## 2020-07-14 DIAGNOSIS — H2013 Chronic iridocyclitis, bilateral: Secondary | ICD-10-CM

## 2020-07-14 DIAGNOSIS — H15003 Unspecified scleritis, bilateral: Secondary | ICD-10-CM

## 2020-07-14 MED ORDER — HUMIRA (2 PEN) 40 MG/0.4ML ~~LOC~~ AJKT: 40.0000 mg | AUTO-INJECTOR | SUBCUTANEOUS | 2 refills | Status: DC

## 2020-07-14 NOTE — Telephone Encounter (Signed)
Prescription for Humira 40mg /0.89mL pen sent to [facility]. Pending Humira copay card information.

## 2020-07-14 NOTE — Telephone Encounter (Signed)
Please start Humira BIV.  Patient received first dose in clinic on 07/12/20.  Insurance: Pharmacologist Diagnosis: H15.003 (bilateral scleritis), H20.13 Dose: 40mg  every 14 days  Eligible for copay card.  Knox Saliva, PharmD, MPH Clinical Pharmacist (Rheumatology and Pulmonology)

## 2020-07-14 NOTE — Telephone Encounter (Signed)
Received notification from Phil Campbell regarding a prior authorization for HUMIRA 40mg  CF. Authorization has been APPROVED from 06/14/20 to 01/10/21.   KeyJanace Aris - PA Case ID: [id number]  Per plan patient must fill through [facility].Please send in rx.  Copay card details pending benefits investigation in [employer] portal.

## 2020-07-15 DIAGNOSIS — H15002 Unspecified scleritis, left eye: Secondary | ICD-10-CM | POA: Diagnosis not present

## 2020-07-15 DIAGNOSIS — H2513 Age-related nuclear cataract, bilateral: Secondary | ICD-10-CM | POA: Diagnosis not present

## 2020-07-15 DIAGNOSIS — H35371 Puckering of macula, right eye: Secondary | ICD-10-CM | POA: Diagnosis not present

## 2020-07-15 DIAGNOSIS — I10 Essential (primary) hypertension: Secondary | ICD-10-CM | POA: Diagnosis not present

## 2020-07-18 DIAGNOSIS — Z23 Encounter for immunization: Secondary | ICD-10-CM | POA: Diagnosis not present

## 2020-07-18 DIAGNOSIS — E7800 Pure hypercholesterolemia, unspecified: Secondary | ICD-10-CM | POA: Diagnosis not present

## 2020-07-18 DIAGNOSIS — I10 Essential (primary) hypertension: Secondary | ICD-10-CM | POA: Diagnosis not present

## 2020-07-18 DIAGNOSIS — Z0000 Encounter for general adult medical examination without abnormal findings: Secondary | ICD-10-CM | POA: Diagnosis not present

## 2020-07-20 NOTE — Telephone Encounter (Signed)
[facility] Complete Savings Card  Rx [id number] Rx [phone] Rx BIN- [id number] Rx [id number]-OHCP  Called [facility] and provided. They advise patient should receive a call in 24-72 hours to set up shipment.

## 2020-07-26 ENCOUNTER — Telehealth: Payer: Self-pay

## 2020-07-26 NOTE — Telephone Encounter (Signed)
Patient left a voicemail stating she is due to take her second dose of Humira today, 07/26/20, but hasn't received the medication from [facility].  Patient states [facility] told her it will take 5-7 business days to process.  Patient is requesting a return call to let her know what she needs to do.

## 2020-07-26 NOTE — Telephone Encounter (Signed)
I called [facility] and was advised they did not have a PA on file, I was then transferred to the dept to expedite the shipping and provide the PA number. I was on hold for a total of 52 minutes and was unable to speak with anyone to expedite.

## 2020-07-27 NOTE — Telephone Encounter (Signed)
Called [facility] this morning to expedite shipping of Humira to patient. She was due for her second Humira dose yesterday, 07/27/20.  Per pharmacy, both prior auth and copay card are on file. Pharmacy will expedite shipping to her and will be calling her within the next 3 hours.  Called patient to notify. Provided her pharmacy phone number [phone] in case she doesn't hear from them. She verbalized understanding  Nothing further needed.  Chesley Mires, PharmD, MPH Clinical Pharmacist (Rheumatology and Pulmonology)

## 2020-08-02 ENCOUNTER — Telehealth: Payer: Self-pay

## 2020-08-02 NOTE — Telephone Encounter (Signed)
Spoke with patient regarding Humira - patient received the medication today and will administer her first dose at home today, then will administer the next dose two weeks today per Rx.

## 2020-08-02 NOTE — Telephone Encounter (Signed)
Patient called stating she just received her prescription of Humira.  Patient is requesting a return call.

## 2020-08-26 ENCOUNTER — Other Ambulatory Visit: Payer: BC Managed Care – PPO

## 2020-08-26 DIAGNOSIS — Z20822 Contact with and (suspected) exposure to covid-19: Secondary | ICD-10-CM | POA: Diagnosis not present

## 2020-08-27 LAB — SARS-COV-2, NAA 2 DAY TAT

## 2020-08-27 LAB — NOVEL CORONAVIRUS, NAA: SARS-CoV-2, NAA: DETECTED — AB

## 2020-08-28 ENCOUNTER — Telehealth: Payer: Self-pay | Admitting: Adult Health

## 2020-08-28 NOTE — Telephone Encounter (Signed)
Called to discuss with patient about COVID-19 symptoms and the use of one of the available treatments for those with mild to moderate Covid symptoms and at a high risk of hospitalization.  Pt appears to qualify for outpatient treatment due to co-morbid conditions and/or a member of an at-risk group in accordance with the FDA Emergency Use Authorization.    Symptom onset: NO sx  Vaccinated: Yes  Booster? No  Immunocompromised? Yes  Qualifiers: Yes   Unable to reach pt -Patient is asymptomatic . Gave hotline number to call back .   Ahman Dugdale NP-C

## 2020-08-29 ENCOUNTER — Telehealth: Payer: Self-pay | Admitting: Internal Medicine

## 2020-08-29 NOTE — Telephone Encounter (Signed)
I recommend waiting 14 days after positive COVID test before resuming the Humira treatment, as long as symptoms stay improved.

## 2020-08-29 NOTE — Telephone Encounter (Signed)
Should patient hold and/or change Humira administration?

## 2020-08-29 NOTE — Telephone Encounter (Signed)
Patient had a high fever last Sunday 08/21/2020, then tested positive for COVID on 08/26/2020. Patient is having no symptoms now, but is on Humira. Patient is due for next dose tomorrow. Please call to advise.

## 2020-08-29 NOTE — Telephone Encounter (Signed)
Spoke with patient, advised Dr. Benjamine Mola recommends waiting 14 days after positive COVID test before resuming the Humira treatment, as long as symptoms stay improved. Based on patient's positive COVID test on 08/26/2020 this would put her next dose on 09/09/2020. Patient expressed understanding.

## 2020-09-12 NOTE — Progress Notes (Signed)
Office Visit Note  Patient: Kari Williams             Date of Birth: May 30, 1961           MRN: [id number]             PCP: Seward Carol, MD Referring: Seward Carol, MD Visit Date: 09/13/2020   Subjective:   History of Present Illness: Kari Williams is a 60 y.o. female here for follow up for bilateral scleritis on treatment with methotrexate 15mg  PO weekly and Humira 40mg  [place] q14days. She finished her previous course of prednisone a month ago without worsening of symptoms. She still hs persistent dryness but no visual changes and no significant pain. 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 She has an upcoming trip with her daughter and requests a note regarding COVID precautions.   Review of Systems  Constitutional: Negative for fatigue.  HENT: Negative for mouth sores, mouth dryness and nose dryness.   Eyes: Positive for visual disturbance and dryness. Negative for pain and itching.  Respiratory: Negative for cough, hemoptysis, shortness of breath and difficulty breathing.   Cardiovascular: Negative for chest pain, palpitations and swelling in legs/feet.  Gastrointestinal: Negative for abdominal pain, blood in stool, constipation and diarrhea.  Endocrine: Negative for increased urination.  Genitourinary: Negative for painful urination.  Musculoskeletal: Positive for arthralgias, joint pain and morning stiffness. Negative for joint swelling, myalgias, muscle weakness, muscle tenderness and myalgias.  Skin: Negative for color change, rash and redness.  Allergic/Immunologic: Negative for susceptible to infections.  Neurological: Negative for dizziness, numbness, headaches, memory loss and weakness.  Hematological: Negative for swollen glands.  Psychiatric/Behavioral: Negative for confusion and sleep disturbance.    PMFS History:  Patient Active Problem List    Diagnosis Date Noted  . 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 High risk medication use 06/07/2020  . Carpal tunnel syndrome 11/29/2015  . Hypertensive retinopathy of both eyes 11/01/2015  . 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  . Nuclear sclerotic cataract of both eyes 10/13/2015  . Epiretinal membrane (ERM) of right eye 10/13/2015  . Lumbosacral radiculitis 09/01/2014  . Routine gynecological examination 11/19/2011  . Abdominal or pelvic swelling, mass, or lump, generalized 11/19/2011  . Overweight (BMI 25.0-29.9) 11/19/2011  . Fibroids 11/19/2011  . HTN (hypertension) 11/19/2011  . Condyloma 11/19/2011    Past Medical History:  Diagnosis Date  . Allergy    seasonal  . Arthritis    osteoarthritis/ddd  . Hypertension     Family History  Problem Relation Age of Onset  . Colon cancer Brother   . Dementia Mother   . Hypotension Mother   . Stroke Father   . Breast cancer Sister   . Hypertension Sister   . Thyroid disease Sister   . Lupus Paternal Aunt   . Sleep apnea Brother   . Stroke Brother   . Lupus Niece    Past Surgical History:  Procedure Laterality Date  . CARPAL TUNNEL RELEASE  2002   right  . COLONOSCOPY    . [place] SURGERY  2006  . LUMBAR FUSION  2011  . [place] SURGERY  2002  . TUBAL LIGATION  07/02/2012   Procedure: BILATERAL TUBAL LIGATION;  Surgeon: Ena Dawley, MD;  Location: [facility];  Service: Gynecology;  Laterality: Bilateral;  . UPPER GASTROINTESTINAL ENDOSCOPY    . VAGINAL HYSTERECTOMY  07/02/2012  Procedure: HYSTERECTOMY VAGINAL;  Surgeon: Ena Dawley, MD;  Location: [facility];  Service: Gynecology;  Laterality: N/A;   Social History   Social History Narrative  . Not on file   Immunization History  Administered Date(s) Administered  . Influenza Split 07/03/2012  . PFIZER(Purple Top)SARS-COV-2 Vaccination 10/13/2019, 11/03/2019     Objective: Vital Signs: BP (!) 145/89 (BP Location: Left Arm, Patient Position:  Sitting, Cuff Size: Normal)   Pulse 82   Wt 190 lb (86.2 kg)   BMI 33.66 kg/m    Physical Exam Constitutional:      Appearance: She is obese.  Eyes:     Conjunctiva/sclera: Conjunctivae normal.  Cardiovascular:     Rate and Rhythm: Normal rate and regular rhythm.  Pulmonary:     Effort: Pulmonary effort is normal.     Breath sounds: Normal breath sounds.  Skin:    General: Skin is warm and dry.     Findings: No rash.  Neurological:     General: No focal deficit present.     Mental Status: She is alert.  Psychiatric:        Mood and Affect: Mood normal.     Musculoskeletal Exam:  Wrists full ROM no tenderness or swelling Fingers full ROM no tenderness or swelling  Investigation: No additional findings.  Imaging: No results found.  Recent Labs: Lab Results  Component Value Date   WBC 7.1 09/13/2020   HGB 14.1 09/13/2020   PLT 342 09/13/2020   NA 139 09/13/2020   K 4.4 09/13/2020   CL 103 09/13/2020   CO2 30 09/13/2020   GLUCOSE 76 09/13/2020   BUN 7 09/13/2020   CREATININE 0.72 09/13/2020   BILITOT 0.7 09/13/2020   ALKPHOS 55 01/22/2010   AST 26 09/13/2020   ALT 20 09/13/2020   PROT 7.5 09/13/2020   ALBUMIN 2.7 (L) 01/22/2010   CALCIUM 9.4 09/13/2020   GFRAA 105 09/13/2020   QFTBGOLDPLUS NEGATIVE 06/07/2020    Speciality Comments: No specialty comments available.  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 She is tolerating methotrexate 15mg  weekly and humira 40mg  q14days without problems. Humira was interrupted temporarily for COVID infection but no worsening of symptoms. Plan to continue current medication for at least several months of disease remission before challenging.  High risk medication use - Plan: CBC with Differential/Platelet, COMPLETE METABOLIC PANEL WITH GFR  Humira can  cause neutropenia or change in liver function so checking CBC, CMP now after 3 doses administered. Also continued monitoring for MTX use.  Orders: Orders Placed This Encounter  Procedures  . CBC with Differential/Platelet  . COMPLETE METABOLIC PANEL WITH GFR   No orders of the defined types were placed in this encounter.   Follow-Up Instructions: Return in about 3 months (around 12/11/2020) for MTX+ADA scleritis f/u.   Collier Salina, MD  Note - This record has been created using [employer].  Chart creation errors have been sought, but may not always  have been located. Such creation errors do not reflect on  the standard of medical care.

## 2020-09-13 ENCOUNTER — Ambulatory Visit: Payer: BC Managed Care – PPO | Admitting: Internal Medicine

## 2020-09-13 ENCOUNTER — Other Ambulatory Visit: Payer: Self-pay

## 2020-09-13 ENCOUNTER — Encounter: Payer: Self-pay | Admitting: Radiology

## 2020-09-13 ENCOUNTER — Encounter: Payer: Self-pay | Admitting: Internal Medicine

## 2020-09-13 VITALS — BP 145/89 | HR 82 | Wt 190.0 lb

## 2020-09-13 DIAGNOSIS — H15003 Unspecified scleritis, bilateral: Secondary | ICD-10-CM

## 2020-09-13 DIAGNOSIS — Z79899 Other long term (current) drug therapy: Secondary | ICD-10-CM | POA: Diagnosis not present

## 2020-09-14 LAB — COMPLETE METABOLIC PANEL WITH GFR
AG Ratio: 1.7 (calc) (ref 1.0–2.5)
ALT: 20 U/L (ref 6–29)
AST: 26 U/L (ref 10–35)
Albumin: 4.7 g/dL (ref 3.6–5.1)
Alkaline phosphatase (APISO): 51 U/L (ref 37–153)
BUN: 7 mg/dL (ref 7–25)
CO2: 30 mmol/L (ref 20–32)
Calcium: 9.4 mg/dL (ref 8.6–10.4)
Chloride: 103 mmol/L (ref 98–110)
Creat: 0.72 mg/dL (ref 0.50–0.99)
GFR, Est African American: 105 mL/min/{1.73_m2} (ref >=60)
GFR, Est Non African American: 90 mL/min/{1.73_m2} (ref >=60)
Globulin: 2.8 g/dL (calc) (ref 1.9–3.7)
Glucose, Bld: 76 mg/dL (ref 65–99)
Potassium: 4.4 mmol/L (ref 3.5–5.3)
Sodium: 139 mmol/L (ref 135–146)
Total Bilirubin: 0.7 mg/dL (ref 0.2–1.2)
Total Protein: 7.5 g/dL (ref 6.1–8.1)

## 2020-09-14 LAB — CBC WITH DIFFERENTIAL/PLATELET
Absolute Monocytes: 717 cells/uL (ref 200–950)
Basophils Absolute: 57 cells/uL (ref 0–200)
Basophils Relative: 0.8 %
Eosinophils Absolute: 92 cells/uL (ref 15–500)
Eosinophils Relative: 1.3 %
HCT: 41.1 % (ref 35.0–45.0)
Hemoglobin: 14.1 g/dL (ref 11.7–15.5)
Lymphs Abs: 2734 cells/uL (ref 850–3900)
MCH: 34.4 pg — ABNORMAL HIGH (ref 27.0–33.0)
MCHC: 34.3 g/dL (ref 32.0–36.0)
MCV: 100.2 fL — ABNORMAL HIGH (ref 80.0–100.0)
MPV: 9.3 fL (ref 7.5–12.5)
Monocytes Relative: 10.1 %
Neutro Abs: 3500 cells/uL (ref 1500–7800)
Neutrophils Relative %: 49.3 %
Platelets: 342 10*3/uL (ref 140–400)
RBC: 4.1 10*6/uL (ref 3.80–5.10)
RDW: 13.2 % (ref 11.0–15.0)
Total Lymphocyte: 38.5 %
WBC: 7.1 10*3/uL (ref 3.8–10.8)

## 2020-09-15 NOTE — Progress Notes (Signed)
Labs look good no change in blood cell count or liver or kidney function tests. Can continue current medications as planned.

## 2020-09-16 ENCOUNTER — Other Ambulatory Visit: Payer: Self-pay | Admitting: Internal Medicine

## 2020-09-16 DIAGNOSIS — Z79899 Other long term (current) drug therapy: Secondary | ICD-10-CM

## 2020-09-16 NOTE — Telephone Encounter (Signed)
Last Visit: 09/13/2020 Next Visit: 12/13/2020  Okay to refill Folic Acid?

## 2020-09-20 DIAGNOSIS — H35371 Puckering of macula, right eye: Secondary | ICD-10-CM | POA: Diagnosis not present

## 2020-09-20 DIAGNOSIS — H2513 Age-related nuclear cataract, bilateral: Secondary | ICD-10-CM | POA: Diagnosis not present

## 2020-09-20 DIAGNOSIS — H15002 Unspecified scleritis, left eye: Secondary | ICD-10-CM | POA: Diagnosis not present

## 2020-09-20 DIAGNOSIS — I10 Essential (primary) hypertension: Secondary | ICD-10-CM | POA: Diagnosis not present

## 2020-10-05 ENCOUNTER — Other Ambulatory Visit: Payer: Self-pay | Admitting: Internal Medicine

## 2020-10-05 DIAGNOSIS — H15003 Unspecified scleritis, bilateral: Secondary | ICD-10-CM

## 2020-10-05 DIAGNOSIS — H2013 Chronic iridocyclitis, bilateral: Secondary | ICD-10-CM

## 2020-10-05 NOTE — Telephone Encounter (Signed)
Last Visit: 09/13/2020 Next Visit: 12/13/2020 Labs: 09/13/2020- CBC (100.2, 34.4), CMP  Current Dose per office note 09/13/2020: methotrexate 15mg  weekly  DX: Bilateral scleritis  Last Fill: 07/13/2020  Okay to refill MTX?

## 2020-10-10 ENCOUNTER — Telehealth: Payer: Self-pay

## 2020-10-10 NOTE — Telephone Encounter (Signed)
Spoke with patient, advised Humira can rarely cause an increase in inflammation as a side effect. This is more common with people who are experiencing problems like rashes or bruising from the medication. Patient denies rashes, bruising, or swelling in the affected joints. Otherwise Dr. Benjamine Mola would recommend wait and see if it gets worse we can see her or she can try interrupting the medication for a few weeks to see if it changes with that. Patient is in agreement.

## 2020-10-10 NOTE — Telephone Encounter (Signed)
Spoke with patient, she complains starting last week onset of left hand aches, then started experiencing right hand aches and weakness, specifically at the base of her right thumb and right wrist. Patient also complains of right knee and right ankle pain, specifically going down steps.   Patient would like to know if this could be a side effect of Humira? How can patient treat this issues moving forward?

## 2020-10-10 NOTE — Telephone Encounter (Signed)
Humira can rarely cause an increase in inflammation as a side effect. This is more common with people who are experiencing problems like rashes or bruising from the medication. Is she noticing anything else like this, or any swelling in the affected joints? Otherwise I would recommend wait and see if it gets worse we can see her or she can try interrupting the medication for a few weeks to see if it changes with that.

## 2020-10-10 NOTE — Telephone Encounter (Signed)
Patient called requesting a return call to discuss her Humira medication.  Patient states she is still experiencing pain in her bilateral hands, as well as right knee and ankle.

## 2020-11-16 ENCOUNTER — Other Ambulatory Visit: Payer: Self-pay

## 2020-11-16 ENCOUNTER — Ambulatory Visit: Payer: BC Managed Care – PPO | Admitting: Podiatrist

## 2020-11-16 ENCOUNTER — Ambulatory Visit (INDEPENDENT_AMBULATORY_CARE_PROVIDER_SITE_OTHER): Payer: BC Managed Care – PPO

## 2020-11-16 ENCOUNTER — Encounter: Payer: Self-pay | Admitting: Podiatrist

## 2020-11-16 DIAGNOSIS — M25571 Pain in right ankle and joints of right foot: Secondary | ICD-10-CM

## 2020-11-16 DIAGNOSIS — M722 Plantar fascial fibromatosis: Secondary | ICD-10-CM

## 2020-11-16 NOTE — Progress Notes (Signed)
Subjective: Kari Williams is a 60 y.o. female patient presents to office with complaint of moderate heel pain on the right heel. Patient admits to post static dyskinesia for 2 months in duration. Patient has treated this problem with wearing slippers with no relief. Denies any other pedal complaints. She also relates she has Rheumatoid Arthritis and is taking methotrexate.   Patient Active Problem List   Diagnosis Date Noted  . 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 High risk medication use 06/07/2020  . Carpal tunnel syndrome 11/29/2015  . Hypertensive retinopathy of both eyes 11/01/2015  . 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  . Nuclear sclerotic cataract of both eyes 10/13/2015  . Epiretinal membrane (ERM) of right eye 10/13/2015  . Lumbosacral radiculitis 09/01/2014  . Routine gynecological examination 11/19/2011  . Abdominal or pelvic swelling, mass, or lump, generalized 11/19/2011  . Overweight (BMI 25.0-29.9) 11/19/2011  . Fibroids 11/19/2011  . HTN (hypertension) 11/19/2011  . Condyloma 11/19/2011    Current Outpatient Medications on File Prior to Visit  Medication Sig Dispense Refill  . Adalimumab (HUMIRA PEN) 40 MG/0.4ML PNKT Inject 40 mg into the skin every 14 (fourteen) days. 2 each 2  . amLODipine (NORVASC) 10 MG tablet Take 10 mg by mouth daily.    . DULoxetine (CYMBALTA) 60 MG capsule Take 60 mg by mouth daily.    . folic acid (FOLVITE) 1 MG tablet TAKE 1 TABLET(1 MG) BY MOUTH DAILY 90 tablet 3  . HYDROcodone-acetaminophen (VICODIN) 5-500 MG per tablet Take 1-2 tablets by mouth every 4 (four) hours as needed for pain. 30 tablet 0  . losartan (COZAAR) 100 MG tablet Take 100 mg by mouth daily.    . methotrexate (RHEUMATREX) 2.5 MG tablet TAKE 6 TABLETS(15 MG) BY MOUTH 1 TIME A WEEK 72 tablet 0  . valsartan (DIOVAN) 160 MG tablet Take 160 mg by mouth daily.    Marland Kitchen estradiol (VIVELLE-DOT) 0.05 MG/24HR patch 2 (two) times a week. (Patient not taking: No sig  reported)    . ibuprofen (ADVIL,MOTRIN) 800 MG tablet Take 1 tablet (800 mg total) by mouth every 8 (eight) hours as needed for pain. (Patient not taking: Reported on 11/16/2020) 50 tablet 1  . metroNIDAZOLE (METROGEL VAGINAL) 0.75 % vaginal gel Place 1 Applicatorful vaginally at bedtime. Metrogel Vaginal 0.75% qhs for five nights. (Patient not taking: No sig reported) 70 g 0  . ondansetron (ZOFRAN) 8 MG tablet Take 1 tablet (8 mg total) by mouth every 8 (eight) hours as needed for nausea. (Patient not taking: No sig reported) 30 tablet 0  . prednisoLONE acetate (PRED FORTE) 1 % ophthalmic suspension in the morning, at noon, in the evening, and at bedtime. (Patient not taking: Reported on 11/16/2020)     No current facility-administered medications on file prior to visit.    Allergies  Allergen Reactions  . Penicillins Hives    Objective: Physical Exam General: The patient is alert and oriented x3 in no acute distress.  Dermatology: Skin is warm, dry and supple bilateral lower extremities. Nails 1-10 are normal. There is no erythema, edema, no eccymosis, no open lesions present. Integument is otherwise unremarkable.  Vascular: Dorsalis Pedis pulse and Posterior Tibial pulse are 2/4 bilateral. Capillary fill time is immediate to all digits.  Neurological: Grossly intact to light touch with an achilles reflex of +2/5 and a  negative Tinel's sign bilateral.  Musculoskeletal: Tenderness to palpation at the medial calcaneal tubercale and through the insertion of the plantar fascia on the Rightfoot.  No pain with compression of calcaneus bilateral. No pain with tuning fork to calcaneus bilateral. No pain with calf compression bilateral. There is decreased Ankle joint range of motion bilateral. All other joints range of motion within normal limits bilateral. Strength 5/5 in all groups bilateral. Ankle ligaments are intact and there is no pain with inversion or eversion of the ankle.  No pain with  anterior drawer.  Mild discomfort with palpation posterior heel.   Gait: Unassisted, Antalgic avoid weight on right heel  Xray, rightfoot:  Normal osseous mineralization. Joint spaces preserved. No fracture/dislocation/boney destruction. Calcaneal spur present with mild thickening of plantar fascia. No other soft tissue abnormalities or radiopaque foreign bodies.   Assessment and Plan:  -Complete examination performed.  -Xrays reviewed -Discussed with patient in detail the condition of plantar fasciitis, how this occurs and general treatment options. Explained both conservative and surgical treatments.  -After oral consent and aseptic prep, injected a mixture containing 10mg  Kenalog and 5mg  marcaine plain was infiltrated into the area of maximal tenderness of the Right heel. Post-injection care discussed with patient.  -Recommended good supportive shoes  - Explained in detail the use of the night splint which was dispensed at today's visit. -Explained and dispensed to patient daily stretching exercises.. -Patient to return to office in 4 weeks -discussed positive benefits of physical therapy-  May consider in the future.  - patient to call sooner if symptoms worsen or fail to improve.   Trudie Buckler, DPM

## 2020-11-16 NOTE — Patient Instructions (Signed)
Wear good supportive running style shoes as much as you are able-  If you don't have a current pair, [employer] and [employer] are great at fitting shoes.    Plantar Fasciitis (Heel Spur Syndrome) with Rehab The plantar fascia is a fibrous, ligament-like, soft-tissue structure that spans the bottom of the foot. Plantar fasciitis is a condition that causes pain in the foot due to inflammation of the tissue. SYMPTOMS   Pain and tenderness on the underneath side of the foot.  Pain that worsens with standing or walking. CAUSES  Plantar fasciitis is caused by irritation and injury to the plantar fascia on the underneath side of the foot. Common mechanisms of injury include:  Direct trauma to bottom of the foot.  Damage to a small nerve that runs under the foot where the main fascia attaches to the heel bone. Stress placed on the plantar fascia due to any mild increased activity or injury RISK INCREASES WITH:   Obesity.  Poor strength and flexibility.  Improperly fitted shoes.  Tight calf muscles.  Flat feet.  Failure to warm-up properly before activity.  PREVENTION  Warm up and stretch properly before activity.  Strength, flexibility  Maintain a health body weight.  Avoid stress on the plantar fascia.  Wear properly fitted shoes, including arch supports for individuals who have flat feet. PROGNOSIS  If treated properly, then the symptoms of plantar fasciitis usually resolve without surgery. However, occasionally surgery is necessary. RELATED COMPLICATIONS   Recurrent symptoms that may result in a chronic condition.  Problems of the lower back that are caused by compensating for the injury, such as limping.  Pain or weakness of the foot during push-off following surgery.  Chronic inflammation, scarring, and partial or complete fascia tear, occurring more often from repeated injections. TREATMENT  Treatment initially involves the use of ice and medication to  help reduce pain and inflammation. The use of strengthening and stretching exercises may help reduce pain with activity, especially stretches of the Achilles tendon.  Your caregiver may recommend that you use arch supports to help reduce stress on the plantar fascia. Often, corticosteroid injections are given to reduce inflammation. If symptoms persist for greater than 6 months despite non-surgical (conservative), then surgery may be recommended.  MEDICATION   If pain medication is necessary, then nonsteroidal anti-inflammatory medications, such as aspirin and ibuprofen, or other minor pain relievers, such as acetaminophen, are often recommended. Corticosteroid injections may be given by your caregiver.  HEAT AND COLD  Cold treatment (icing) relieves pain and reduces inflammation. Cold treatment should be applied for 10 to 15 minutes every 2 to 3 hours for inflammation and pain and immediately after any activity that aggravates your symptoms. Use ice packs or massage the area with a piece of ice (ice massage).  Heat treatment may be used prior to performing the stretching and strengthening activities prescribed by your caregiver, physical therapist, or athletic trainer. Use a heat pack or soak the injury in warm water. SEEK IMMEDIATE MEDICAL CARE IF:  Treatment seems to offer no benefit, or the condition worsens.  Any medications produce adverse side effects.  Perform this particular stretch daily first thing in the morning and before you go to bed. Hold for 30 seconds.    Try all the exercises and choose your favorite 3 to perform daily--  EXERCISES-- perform each exercise a total of 10-15 repetitions.  Hold for 30 seconds and perform 3 times per day   RANGE OF MOTION (ROM)  AND STRETCHING EXERCISES - Plantar Fasciitis (Heel Spur Syndrome) These exercises may help you when beginning to rehabilitate your injury.   While completing these exercises, remember:   Restoring tissue flexibility  helps normal motion to return to the joints. This allows healthier, less painful movement and activity.  An effective stretch should be held for at least 30 seconds.  A stretch should never be painful. You should only feel a gentle lengthening or release in the stretched tissue. RANGE OF MOTION - Toe Extension, Flexion  Sit with your right / left leg crossed over your opposite knee.  Grasp your toes and gently pull them back toward the top of your foot. You should feel a stretch on the bottom of your toes and/or foot.  Hold this stretch for __________ seconds.  Now, gently pull your toes toward the bottom of your foot. You should feel a stretch on the top of your toes and or foot.  Hold this stretch for __________ seconds. Repeat __________ times. Complete this stretch __________ times per day.  RANGE OF MOTION - Ankle Dorsiflexion, Active Assisted  Remove shoes and sit on a chair that is preferably not on a carpeted surface.  Place right / left foot under knee. Extend your opposite leg for support.  Keeping your heel down, slide your right / left foot back toward the chair until you feel a stretch at your ankle or calf. If you do not feel a stretch, slide your bottom forward to the edge of the chair, while still keeping your heel down.  Hold this stretch for __________ seconds. Repeat __________ times. Complete this stretch __________ times per day.  STRETCH  Gastroc, Standing  Place hands on wall.  Extend right / left leg, keeping the front knee somewhat bent.  Slightly point your toes inward on your back foot.  Keeping your right / left heel on the floor and your knee straight, shift your weight toward the wall, not allowing your back to arch.  You should feel a gentle stretch in the right / left calf. Hold this position for __________ seconds. Repeat __________ times. Complete this stretch __________ times per day. STRETCH  Soleus, Standing  Place hands on wall.  Extend  right / left leg, keeping the other knee somewhat bent.  Slightly point your toes inward on your back foot.  Keep your right / left heel on the floor, bend your back knee, and slightly shift your weight over the back leg so that you feel a gentle stretch deep in your back calf.  Hold this position for __________ seconds. Repeat __________ times. Complete this stretch __________ times per day. STRETCH  Gastrocsoleus, Standing  Note: This exercise can place a lot of stress on your foot and ankle. Please complete this exercise only if specifically instructed by your caregiver.   Place the ball of your right / left foot on a step, keeping your other foot firmly on the same step.  Hold on to the wall or a rail for balance.  Slowly lift your other foot, allowing your body weight to press your heel down over the edge of the step.  You should feel a stretch in your right / left calf.  Hold this position for __________ seconds.  Repeat this exercise with a slight bend in your right / left knee. Repeat __________ times. Complete this stretch __________ times per day.  STRENGTHENING EXERCISES - Plantar Fasciitis (Heel Spur Syndrome)  These exercises may help you when beginning to  rehabilitate your injury. They may resolve your symptoms with or without further involvement from your physician, physical therapist or athletic trainer. While completing these exercises, remember:   Muscles can gain both the endurance and the strength needed for everyday activities through controlled exercises.  Complete these exercises as instructed by your physician, physical therapist or athletic trainer. Progress the resistance and repetitions only as guided.

## 2020-11-25 ENCOUNTER — Other Ambulatory Visit: Payer: Self-pay | Admitting: Podiatrist

## 2020-11-25 DIAGNOSIS — M722 Plantar fascial fibromatosis: Secondary | ICD-10-CM

## 2020-12-07 ENCOUNTER — Other Ambulatory Visit: Payer: Self-pay

## 2020-12-07 ENCOUNTER — Ambulatory Visit: Payer: BC Managed Care – PPO | Admitting: Internal Medicine

## 2020-12-07 ENCOUNTER — Encounter: Payer: Self-pay | Admitting: Internal Medicine

## 2020-12-07 VITALS — BP 134/88 | HR 88 | Ht 63.5 in | Wt 185.6 lb

## 2020-12-07 DIAGNOSIS — Z79899 Other long term (current) drug therapy: Secondary | ICD-10-CM

## 2020-12-07 DIAGNOSIS — H2013 Chronic iridocyclitis, bilateral: Secondary | ICD-10-CM | POA: Diagnosis not present

## 2020-12-07 DIAGNOSIS — H2513 Age-related nuclear cataract, bilateral: Secondary | ICD-10-CM | POA: Diagnosis not present

## 2020-12-07 NOTE — Progress Notes (Signed)
Office Visit Note  Patient: Kari Williams             Date of Birth: 25-Aug-1960           MRN: [id number]             PCP: Seward Carol, MD Referring: Seward Carol, MD Visit Date: 12/07/2020   Subjective:  Follow-up (Patient feels as if symptoms are well controlled with current medication regimen. )   History of Present Illness: Kari Williams is a 60 y.o. female here for follow up for bilateral chronic uveitis and scleritis on methotrexate 15 mg  PO weekly and Humira 40 mg [place] q14days. She has not noticed any problems with her current medications. No new episode of significant eye pain and inflammation.  Her most recent eye exam with Dr. Brigitte Pulse and February showed residual changes but not active eye disease.   Review of Systems  Constitutional: Positive for fatigue.  HENT: Negative for mouth sores, mouth dryness and nose dryness.   Eyes: Positive for pain and dryness. Negative for redness, itching and visual disturbance.  Respiratory: Negative for cough, hemoptysis, shortness of breath and difficulty breathing.   Cardiovascular: Negative for chest pain, palpitations and swelling in legs/feet.  Gastrointestinal: Negative for abdominal pain, blood in stool, constipation and diarrhea.  Endocrine: Negative for increased urination.  Genitourinary: Negative for painful urination.  Musculoskeletal: Positive for myalgias, morning stiffness and myalgias. Negative for arthralgias, joint pain, joint swelling, muscle weakness and muscle tenderness.  Skin: Negative for color change, rash and redness.  Allergic/Immunologic: Negative for susceptible to infections.  Neurological: Negative for dizziness, numbness, headaches, memory loss and weakness.  Hematological: Negative for swollen glands.  Psychiatric/Behavioral: Negative for confusion and sleep disturbance.    PMFS History:  Patient Active Problem List   Diagnosis Date Noted  . 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 High risk  medication use 06/07/2020  . Carpal tunnel syndrome 11/29/2015  . Hypertensive retinopathy of both eyes 11/01/2015  . 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  . Nuclear sclerotic cataract of both eyes 10/13/2015  . Epiretinal membrane (ERM) of right eye 10/13/2015  . Lumbosacral radiculitis 09/01/2014  . Routine gynecological examination 11/19/2011  . Abdominal or pelvic swelling, mass, or lump, generalized 11/19/2011  . Overweight (BMI 25.0-29.9) 11/19/2011  . Fibroids 11/19/2011  . HTN (hypertension) 11/19/2011  . Condyloma 11/19/2011    Past Medical History:  Diagnosis Date  . Allergy    seasonal  . Arthritis    osteoarthritis/ddd  . Hypertension     Family History  Problem Relation Age of Onset  . Colon cancer Brother   . Dementia Mother   . Hypotension Mother   . Stroke Father   . Breast cancer Sister   . Hypertension Sister   . Thyroid disease Sister   . Lupus Paternal Aunt   . Sleep apnea Brother   . Stroke Brother   . Lupus Niece    Past Surgical History:  Procedure Laterality Date  . CARPAL TUNNEL RELEASE  2002   right  . COLONOSCOPY    . [place] SURGERY  2006  . LUMBAR FUSION  2011  . [place] SURGERY  2002  . TUBAL LIGATION  07/02/2012   Procedure: BILATERAL TUBAL LIGATION;  Surgeon: Ena Dawley, MD;  Location: [facility];  Service: Gynecology;  Laterality: Bilateral;  . UPPER GASTROINTESTINAL ENDOSCOPY    . VAGINAL HYSTERECTOMY  07/02/2012   Procedure: HYSTERECTOMY VAGINAL;  Surgeon: Ena Dawley, MD;  Location: [facility];  Service: Gynecology;  Laterality: N/A;   Social History   Social History Narrative  . Not on file   Immunization History  Administered Date(s) Administered  . Influenza Split 07/03/2012  . PFIZER(Purple Top)SARS-COV-2 Vaccination 10/13/2019, 11/03/2019     Objective: Vital Signs: BP 134/88 (BP Location: Left Arm, Patient Position: Sitting, Cuff Size: Normal)   Pulse 88   Ht 5' 3.5" (1.613 m)   Wt 185 lb 9.6  oz (84.2 kg)   BMI 32.36 kg/m    Physical Exam Constitutional:      Appearance: She is obese.  Musculoskeletal:        General: No swelling or tenderness.  Skin:    General: Skin is warm and dry.     Findings: No rash.  Neurological:     Mental Status: She is alert.      Investigation: No additional findings.  Imaging: DG Ankle Complete Right  Result Date: 11/25/2020 Please see detailed radiograph report in office note.   Recent Labs: Lab Results  Component Value Date   WBC 8.1 12/07/2020   HGB 13.3 12/07/2020   PLT 327 12/07/2020   NA 140 12/07/2020   K 4.5 12/07/2020   CL 103 12/07/2020   CO2 29 12/07/2020   GLUCOSE 78 12/07/2020   BUN 13 12/07/2020   CREATININE 0.93 12/07/2020   BILITOT 0.9 12/07/2020   ALKPHOS 55 01/22/2010   AST 24 12/07/2020   ALT 23 12/07/2020   PROT 7.5 12/07/2020   ALBUMIN 2.7 (L) 01/22/2010   CALCIUM 10.1 12/07/2020   GFRAA 77 12/07/2020   QFTBGOLDPLUS NEGATIVE 06/07/2020    Speciality Comments: No specialty comments available.  Procedures:  No procedures performed Allergies: Penicillins   Assessment / Plan:     Visit Diagnoses: Nuclear sclerotic cataract of both eyes Bilateral chronic anterior uveitis  Eye disease seems to be well controlled on the current regimen.  Currently not requiring any ongoing steroid treatment and she has scheduled follow-up for ophthalmology.  High risk medication use - Plan: CBC with Differential/Platelet, COMPLETE METABOLIC PANEL WITH GFR  Monitoring for methotrexate toxicity with cytopenias or hepatotoxicity checking CBC and CMP today.  She does not describe any clinical features of intolerance.  Orders: Orders Placed This Encounter  Procedures  . CBC with Differential/Platelet  . COMPLETE METABOLIC PANEL WITH GFR   No orders of the defined types were placed in this encounter.    Follow-Up Instructions: No follow-ups on file.   Collier Salina, MD  Note - This record has been  created using [employer].  Chart creation errors have been sought, but may not always  have been located. Such creation errors do not reflect on  the standard of medical care.

## 2020-12-08 LAB — COMPLETE METABOLIC PANEL WITH GFR
AG Ratio: 1.7 (calc) (ref 1.0–2.5)
ALT: 23 U/L (ref 6–29)
AST: 24 U/L (ref 10–35)
Albumin: 4.7 g/dL (ref 3.6–5.1)
Alkaline phosphatase (APISO): 52 U/L (ref 37–153)
BUN: 13 mg/dL (ref 7–25)
CO2: 29 mmol/L (ref 20–32)
Calcium: 10.1 mg/dL (ref 8.6–10.4)
Chloride: 103 mmol/L (ref 98–110)
Creat: 0.93 mg/dL (ref 0.50–0.99)
GFR, Est African American: 77 mL/min/{1.73_m2} (ref >=60)
GFR, Est Non African American: 66 mL/min/{1.73_m2} (ref >=60)
Globulin: 2.8 g/dL (calc) (ref 1.9–3.7)
Glucose, Bld: 78 mg/dL (ref 65–99)
Potassium: 4.5 mmol/L (ref 3.5–5.3)
Sodium: 140 mmol/L (ref 135–146)
Total Bilirubin: 0.9 mg/dL (ref 0.2–1.2)
Total Protein: 7.5 g/dL (ref 6.1–8.1)

## 2020-12-08 LAB — CBC WITH DIFFERENTIAL/PLATELET
Absolute Monocytes: 802 cells/uL (ref 200–950)
Basophils Absolute: 73 cells/uL (ref 0–200)
Basophils Relative: 0.9 %
Eosinophils Absolute: 49 cells/uL (ref 15–500)
Eosinophils Relative: 0.6 %
HCT: 39.7 % (ref 35.0–45.0)
Hemoglobin: 13.3 g/dL (ref 11.7–15.5)
Lymphs Abs: 2876 cells/uL (ref 850–3900)
MCH: 33.7 pg — ABNORMAL HIGH (ref 27.0–33.0)
MCHC: 33.5 g/dL (ref 32.0–36.0)
MCV: 100.5 fL — ABNORMAL HIGH (ref 80.0–100.0)
MPV: 9.4 fL (ref 7.5–12.5)
Monocytes Relative: 9.9 %
Neutro Abs: 4301 cells/uL (ref 1500–7800)
Neutrophils Relative %: 53.1 %
Platelets: 327 10*3/uL (ref 140–400)
RBC: 3.95 10*6/uL (ref 3.80–5.10)
RDW: 12.7 % (ref 11.0–15.0)
Total Lymphocyte: 35.5 %
WBC: 8.1 10*3/uL (ref 3.8–10.8)

## 2020-12-08 NOTE — Progress Notes (Signed)
Lab tests look okay to continue methotrexate and Humira. There is a small change in the red blood cells over time probably due to methotrexate. She needs to be taking a folic acid every day, if she is already taking 1 mg consistently she should increase this to 2 mg daily.

## 2020-12-13 ENCOUNTER — Ambulatory Visit: Payer: BC Managed Care – PPO | Admitting: Internal Medicine

## 2020-12-16 ENCOUNTER — Ambulatory Visit: Payer: BC Managed Care – PPO | Admitting: Podiatry

## 2020-12-27 ENCOUNTER — Telehealth: Payer: Self-pay

## 2020-12-27 NOTE — Telephone Encounter (Signed)
Submitted a Prior Authorization request to [employer] for HUMIRA via CoverMyMeds. Will update once we receive a response.   Key: Dene Gentry

## 2020-12-27 NOTE — Telephone Encounter (Signed)
Received notification from [place] regarding a prior authorization for [place]. Authorization has been APPROVED from 11/27/2020 to 12/27/2021.   Authorization # [id number]

## 2020-12-29 ENCOUNTER — Other Ambulatory Visit: Payer: Self-pay | Admitting: Internal Medicine

## 2020-12-29 DIAGNOSIS — H2013 Chronic iridocyclitis, bilateral: Secondary | ICD-10-CM

## 2020-12-29 DIAGNOSIS — H15003 Unspecified scleritis, bilateral: Secondary | ICD-10-CM

## 2021-01-24 DIAGNOSIS — H35371 Puckering of macula, right eye: Secondary | ICD-10-CM | POA: Diagnosis not present

## 2021-01-24 DIAGNOSIS — H15002 Unspecified scleritis, left eye: Secondary | ICD-10-CM | POA: Diagnosis not present

## 2021-01-24 DIAGNOSIS — H35033 Hypertensive retinopathy, bilateral: Secondary | ICD-10-CM | POA: Diagnosis not present

## 2021-01-24 DIAGNOSIS — H2513 Age-related nuclear cataract, bilateral: Secondary | ICD-10-CM | POA: Diagnosis not present

## 2021-02-08 ENCOUNTER — Other Ambulatory Visit: Payer: Self-pay

## 2021-02-08 DIAGNOSIS — Z79899 Other long term (current) drug therapy: Secondary | ICD-10-CM

## 2021-02-08 MED ORDER — FOLIC ACID 1 MG PO TABS: 2.0000 mg | ORAL_TABLET | Freq: Every day | ORAL | 3 refills | Status: AC

## 2021-02-08 NOTE — Telephone Encounter (Signed)
Patient called requesting prescription refill of Folic Acid.  Patient states Dr. Benjamine Mola increased her dosage to 2 pills per day and now she is out of medication and not able to refill until August.  Patient requested a new prescription with the increased dosage be sent to [facility] at [facility].

## 2021-02-10 DIAGNOSIS — H9041 Sensorineural hearing loss, unilateral, right ear, with unrestricted hearing on the contralateral side: Secondary | ICD-10-CM | POA: Diagnosis not present

## 2021-02-10 DIAGNOSIS — H9311 Tinnitus, right ear: Secondary | ICD-10-CM | POA: Diagnosis not present

## 2021-02-21 DIAGNOSIS — M542 Cervicalgia: Secondary | ICD-10-CM | POA: Diagnosis not present

## 2021-03-01 DIAGNOSIS — M5412 Radiculopathy, cervical region: Secondary | ICD-10-CM | POA: Diagnosis not present

## 2021-03-01 DIAGNOSIS — S161XXD Strain of muscle, fascia and tendon at neck level, subsequent encounter: Secondary | ICD-10-CM | POA: Diagnosis not present

## 2021-03-06 DIAGNOSIS — S161XXD Strain of muscle, fascia and tendon at neck level, subsequent encounter: Secondary | ICD-10-CM | POA: Diagnosis not present

## 2021-03-06 DIAGNOSIS — M5412 Radiculopathy, cervical region: Secondary | ICD-10-CM | POA: Diagnosis not present

## 2021-03-07 NOTE — Progress Notes (Signed)
Office Visit Note  Patient: Kari Williams             Date of Birth: [birth year]           MRN: [id number]             PCP: Seward Carol, MD Referring: Seward Carol, MD Visit Date: 03/08/2021   Subjective:  Follow-up (Patient is taking MTX 15 mg weekly and Humira 40 mg [place] every 14 days. Patient feels as if symptoms are well controlled. )   History of Present Illness: Kari Williams is a 60 y.o. female here for follow up for uveitis and scleritis on methotrexate 15 mg PO weekly and Humira 40 mg [place] q14days. Review of ophthalmology record on 01/24/21 disease appears quiescent on current medications without steroid treatment. At our last visit mild AST elevation and MCV elevation so increased folic acid supplementation to 2 mg PO daily. She developed pain in the upper back affecting right shoulder blade extending up to the back of the neck. She saw Dr. Berenice Primas in orthopedic surgery for this with oral meloxicam treatment and saw physical therapy getting some improvement so far.  12/07/20 Kari Williams is a 60 y.o. female here for follow up for bilateral chronic uveitis and scleritis on methotrexate 15 mg  PO weekly and Humira 40 mg [place] q14days. She has not noticed any problems with her current medications. No new episode of significant eye pain and inflammation.  Her most recent eye exam with Dr. Manuella Ghazi in February showed residual changes but not active eye disease.  09/13/20 Kari Williams is a 60 y.o. female here for follow up for bilateral scleritis on treatment with methotrexate '15mg'$  PO weekly and Humira '40mg'$  [place] q14days. She finished her previous course of prednisone a month ago without worsening of symptoms. She still hs persistent dryness but no visual changes and no significant pain. 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 She has an upcoming trip with her daughter  and requests a note regarding COVID precautions.  07/05/20 Increased eye pain, blurriness in mornings bilaterally. Saw Dr. Manuella Ghazi 06/17/20 with active scleritis bilaterally worse than on her previous exam. No problems tolerating methotrexate and prednisone so far. She has decreased to '10mg'$  daily dose prednisone. Eyes are bothering her a lot feels like something is in them constantly.  06/07/20 Kari Williams is a 60 y.o. 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 She has a previous history of treatment with methotrexate for iritis in 2010-2015 with Dr. Herbert Deaner and Dr. Charlestine Night. She had been off any treatment but visit with Dr. 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  Since earlier this year she has experienced eye redness watering and blurry vision.  She is more symptomatic complaint on the right than on the left currently but both sides bother her.  Besides the eye complaints she does also report some fairly diffuse arthralgias and morning stiffness up to 30 minutes.  She has not noticed any specific joint swelling warmth or erythema.  In the past she has also been treated with oral or ocular steroids but has not received any for the current relapse of symptoms.    Review of Systems  Constitutional:  Negative for fatigue.  HENT:  Negative for mouth sores, mouth dryness and nose dryness.   Eyes:  Positive for pain and dryness. Negative for itching and visual disturbance.  Respiratory:  Negative for cough, hemoptysis, shortness of breath and difficulty breathing.   Cardiovascular:  Negative for chest pain, palpitations and swelling in legs/feet.  Gastrointestinal:  Negative for abdominal pain, blood in stool, constipation and diarrhea.  Endocrine: Negative for increased urination.  Genitourinary:  Negative for painful urination.  Musculoskeletal:  Positive for joint pain and joint pain. Negative for joint swelling,  myalgias, muscle weakness, morning stiffness, muscle tenderness and myalgias.  Skin:  Negative for color change, rash and redness.  Allergic/Immunologic: Negative for susceptible to infections.  Neurological:  Negative for dizziness, numbness, headaches, memory loss and weakness.  Hematological:  Negative for swollen glands.  Psychiatric/Behavioral:  Positive for sleep disturbance. Negative for confusion.    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   High risk medication use 06/07/2020   Carpal tunnel syndrome 11/29/2015   Hypertensive retinopathy of both eyes 11/01/2015   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   Nuclear sclerotic cataract of both eyes 10/13/2015   Epiretinal membrane (ERM) of right eye 10/13/2015   Lumbosacral radiculitis 09/01/2014   Routine gynecological examination 11/19/2011   Abdominal or pelvic swelling, mass, or lump, generalized 11/19/2011   Overweight (BMI 25.0-29.9) 11/19/2011   Fibroids 11/19/2011   HTN (hypertension) 11/19/2011   Condyloma 11/19/2011    Past Medical History:  Diagnosis Date   Allergy    seasonal   Arthritis    osteoarthritis/ddd   Hypertension     Family History  Problem Relation Age of Onset   Colon cancer Brother    Dementia Mother    Hypotension Mother    Stroke Father    Breast cancer Sister    Hypertension Sister    Thyroid disease Sister    Lupus Paternal Aunt    Sleep apnea Brother    Stroke Brother    Lupus Niece    Past Surgical History:  Procedure Laterality Date   CARPAL TUNNEL RELEASE  2002   right   COLONOSCOPY     LUMBAR [place]  2006   LUMBAR FUSION  2011   THORACIC [place]  2002   TUBAL LIGATION  07/02/2012   Procedure: BILATERAL TUBAL LIGATION;  Surgeon: Ena Dawley, MD;  Location: [facility];  Service: Gynecology;  Laterality: Bilateral;    UPPER GASTROINTESTINAL ENDOSCOPY     VAGINAL HYSTERECTOMY  07/02/2012   Procedure: HYSTERECTOMY VAGINAL;  Surgeon: Ena Dawley, MD;  Location: [facility];  Service: Gynecology;  Laterality: N/A;   Social History   Social History Narrative   Not on file   Immunization History  Administered Date(s) Administered   Influenza Split 07/03/2012   PFIZER(Purple Top)SARS-COV-2 Vaccination 10/13/2019, 11/03/2019     Objective: Vital Signs: BP 137/82 (BP Location: Left Arm, Patient Position: Sitting, Cuff Size: Normal)   Pulse 76   Ht '5\' 3"'$  (1.6 m)   Wt 184 lb (83.5 kg)   BMI 32.59 kg/m    Physical Exam Constitutional:      Appearance: She is obese.  Cardiovascular:     Rate and Rhythm: Normal rate and regular rhythm.  Pulmonary:     Effort: Pulmonary effort is normal.     Breath sounds: Normal breath sounds.  Skin:    General: Skin is warm and dry.     Findings: No  rash.  Neurological:     Mental Status: She is alert.  Psychiatric:        Mood and Affect: Mood normal.     Musculoskeletal Exam:  Neck full ROM, tenderness to palpation over cervical paraspinal muscles on right side Shoulders full ROM, palpation around medial border of right scapula with increased muscle tone and tenderness, no warmth no decreased ROM Elbows full ROM no tenderness or swelling Wrists full ROM no tenderness or swelling Fingers full ROM no tenderness or swelling  Investigation: No additional findings.  Imaging: No results found.  Recent Labs: Lab Results  Component Value Date   WBC 8.1 12/07/2020   HGB 13.3 12/07/2020   PLT 327 12/07/2020   NA 140 12/07/2020   K 4.5 12/07/2020   CL 103 12/07/2020   CO2 29 12/07/2020   GLUCOSE 78 12/07/2020   BUN 13 12/07/2020   CREATININE 0.93 12/07/2020   BILITOT 0.9 12/07/2020   ALKPHOS 55 01/22/2010   AST 24 12/07/2020   ALT 23 12/07/2020   PROT 7.5 12/07/2020   ALBUMIN 2.7 (L) 01/22/2010   CALCIUM 10.1 12/07/2020   GFRAA 77 12/07/2020    QFTBGOLDPLUS NEGATIVE 06/07/2020    Speciality Comments: No specialty comments available.  Procedures:  No procedures performed Allergies: Penicillins   Assessment / Plan:     Visit Diagnoses: Bilateral chronic anterior uveitis Bilateral scleritis  Inflammation appears well controlled per most recent ophthalmology exam with no steroid requirement since about 6 months ago.  Plan to continue Humira 40 mg [place] q. 14 days and methotrexate 15 mg p.o. weekly with folic acid 2 mg p.o. daily.  If laboratory monitoring is abnormal or worsening today would recommend methotrexate dose reduction.  High risk medication use - Plan: CBC with Differential/Platelet, COMPLETE METABOLIC PANEL WITH GFR  Medication toxicity monitoring for Humira and methotrexate checking CBC and CMP today.  Acute pain of right shoulder  Pain and tenderness localizes around right scapula medial border but superficial so possibly sprain or spasticity in area of trapezius or maybe levator scapula. Already improving with treatment per ortho clinic with limited NSAIDs use and physical therapy.  Orders: Orders Placed This Encounter  Procedures   CBC with Differential/Platelet   COMPLETE METABOLIC PANEL WITH GFR    No orders of the defined types were placed in this encounter.    Follow-Up Instructions: Return in about 3 months (around 06/08/2021) for Uveitis on MTX+ADA f/u 65mo.   CCollier Salina MD  Note - This record has been created using [employer]  Chart creation errors have been sought, but may not always  have been located. Such creation errors do not reflect on  the standard of medical care.

## 2021-03-08 ENCOUNTER — Ambulatory Visit: Payer: BC Managed Care – PPO | Admitting: Internal Medicine

## 2021-03-08 ENCOUNTER — Encounter: Payer: Self-pay | Admitting: Internal Medicine

## 2021-03-08 ENCOUNTER — Other Ambulatory Visit: Payer: Self-pay

## 2021-03-08 VITALS — BP 137/82 | HR 76 | Ht 63.0 in | Wt 184.0 lb

## 2021-03-08 DIAGNOSIS — H15003 Unspecified scleritis, bilateral: Secondary | ICD-10-CM

## 2021-03-08 DIAGNOSIS — K625 Hemorrhage of anus and rectum: Secondary | ICD-10-CM | POA: Insufficient documentation

## 2021-03-08 DIAGNOSIS — H2013 Chronic iridocyclitis, bilateral: Secondary | ICD-10-CM

## 2021-03-08 DIAGNOSIS — Z79899 Other long term (current) drug therapy: Secondary | ICD-10-CM | POA: Diagnosis not present

## 2021-03-08 DIAGNOSIS — M25511 Pain in right shoulder: Secondary | ICD-10-CM | POA: Diagnosis not present

## 2021-03-08 LAB — COMPLETE METABOLIC PANEL WITH GFR
AG Ratio: 1.6 (calc) (ref 1.0–2.5)
ALT: 19 U/L (ref 6–29)
AST: 23 U/L (ref 10–35)
Albumin: 4.5 g/dL (ref 3.6–5.1)
Alkaline phosphatase (APISO): 55 U/L (ref 37–153)
BUN: 15 mg/dL (ref 7–25)
CO2: 30 mmol/L (ref 20–32)
Calcium: 9.9 mg/dL (ref 8.6–10.4)
Chloride: 104 mmol/L (ref 98–110)
Creat: 0.89 mg/dL (ref 0.50–1.05)
Globulin: 2.9 g/dL (calc) (ref 1.9–3.7)
Glucose, Bld: 75 mg/dL (ref 65–99)
Potassium: 4.4 mmol/L (ref 3.5–5.3)
Sodium: 139 mmol/L (ref 135–146)
Total Bilirubin: 0.6 mg/dL (ref 0.2–1.2)
Total Protein: 7.4 g/dL (ref 6.1–8.1)
eGFR: 73 mL/min/{1.73_m2} (ref >=60)

## 2021-03-08 LAB — CBC WITH DIFFERENTIAL/PLATELET
Absolute Monocytes: 755 cells/uL (ref 200–950)
Basophils Absolute: 39 cells/uL (ref 0–200)
Basophils Relative: 0.5 %
Eosinophils Absolute: 123 cells/uL (ref 15–500)
Eosinophils Relative: 1.6 %
HCT: 38.3 % (ref 35.0–45.0)
Hemoglobin: 12.9 g/dL (ref 11.7–15.5)
Lymphs Abs: 2926 cells/uL (ref 850–3900)
MCH: 34.6 pg — ABNORMAL HIGH (ref 27.0–33.0)
MCHC: 33.7 g/dL (ref 32.0–36.0)
MCV: 102.7 fL — ABNORMAL HIGH (ref 80.0–100.0)
MPV: 9.7 fL (ref 7.5–12.5)
Monocytes Relative: 9.8 %
Neutro Abs: 3858 cells/uL (ref 1500–7800)
Neutrophils Relative %: 50.1 %
Platelets: 316 10*3/uL (ref 140–400)
RBC: 3.73 10*6/uL — ABNORMAL LOW (ref 3.80–5.10)
RDW: 12.9 % (ref 11.0–15.0)
Total Lymphocyte: 38 %
WBC: 7.7 10*3/uL (ref 3.8–10.8)

## 2021-03-09 DIAGNOSIS — S161XXD Strain of muscle, fascia and tendon at neck level, subsequent encounter: Secondary | ICD-10-CM | POA: Diagnosis not present

## 2021-03-09 DIAGNOSIS — M5412 Radiculopathy, cervical region: Secondary | ICD-10-CM | POA: Diagnosis not present

## 2021-03-13 DIAGNOSIS — S161XXD Strain of muscle, fascia and tendon at neck level, subsequent encounter: Secondary | ICD-10-CM | POA: Diagnosis not present

## 2021-03-13 DIAGNOSIS — M5412 Radiculopathy, cervical region: Secondary | ICD-10-CM | POA: Diagnosis not present

## 2021-03-16 DIAGNOSIS — S161XXD Strain of muscle, fascia and tendon at neck level, subsequent encounter: Secondary | ICD-10-CM | POA: Diagnosis not present

## 2021-03-16 DIAGNOSIS — M5412 Radiculopathy, cervical region: Secondary | ICD-10-CM | POA: Diagnosis not present

## 2021-03-16 NOTE — Progress Notes (Signed)
I recommend she decrease the methotrexate dose down to 5 tablets per week. The red blood cells show slight change although numbers are okay. Liver and kidney numbers are normal.

## 2021-03-17 ENCOUNTER — Other Ambulatory Visit: Payer: Self-pay | Admitting: Internal Medicine

## 2021-03-17 DIAGNOSIS — H2013 Chronic iridocyclitis, bilateral: Secondary | ICD-10-CM

## 2021-03-17 DIAGNOSIS — H15003 Unspecified scleritis, bilateral: Secondary | ICD-10-CM

## 2021-03-19 ENCOUNTER — Other Ambulatory Visit: Payer: Self-pay | Admitting: Internal Medicine

## 2021-03-19 DIAGNOSIS — H2013 Chronic iridocyclitis, bilateral: Secondary | ICD-10-CM

## 2021-03-19 DIAGNOSIS — H15003 Unspecified scleritis, bilateral: Secondary | ICD-10-CM

## 2021-03-20 DIAGNOSIS — S161XXD Strain of muscle, fascia and tendon at neck level, subsequent encounter: Secondary | ICD-10-CM | POA: Diagnosis not present

## 2021-03-20 DIAGNOSIS — M5412 Radiculopathy, cervical region: Secondary | ICD-10-CM | POA: Diagnosis not present

## 2021-03-21 DIAGNOSIS — M4722 Other spondylosis with radiculopathy, cervical region: Secondary | ICD-10-CM | POA: Diagnosis not present

## 2021-03-23 DIAGNOSIS — M5412 Radiculopathy, cervical region: Secondary | ICD-10-CM | POA: Diagnosis not present

## 2021-03-23 DIAGNOSIS — S161XXD Strain of muscle, fascia and tendon at neck level, subsequent encounter: Secondary | ICD-10-CM | POA: Diagnosis not present

## 2021-06-07 ENCOUNTER — Ambulatory Visit: Payer: BC Managed Care – PPO | Admitting: Internal Medicine

## 2021-06-08 ENCOUNTER — Other Ambulatory Visit: Payer: Self-pay | Admitting: *Deleted

## 2021-06-08 DIAGNOSIS — H15003 Unspecified scleritis, bilateral: Secondary | ICD-10-CM

## 2021-06-08 DIAGNOSIS — H2013 Chronic iridocyclitis, bilateral: Secondary | ICD-10-CM

## 2021-06-08 MED ORDER — HUMIRA (2 PEN) 40 MG/0.4ML ~~LOC~~ AJKT: 40.0000 mg | AUTO-INJECTOR | SUBCUTANEOUS | 2 refills | Status: DC

## 2021-06-08 NOTE — Telephone Encounter (Signed)
Refill request received via fax  Next Visit: 06/14/2021  Last Visit: 03/08/2021  Last Fill:   DX:Bilateral chronic anterior uveitis  Current Dose per office note 03/08/2021: Humira 40 mg subcu q. 14 days   Labs: 03/08/2021 red blood cells show slight change although numbers are okay. Liver and kidney numbers are normal.  TB Gold: 06/17/2020 Neg    Okay to refill Humira?

## 2021-06-13 NOTE — Progress Notes (Signed)
Office Visit Note  Patient: Kari Williams             Date of Birth: 1961/06/07           MRN: [id number]             PCP: Seward Carol, MD Referring: Seward Carol, MD Visit Date: 06/14/2021   Subjective:  Follow-up (Doing good)   History of Present Illness: Kari Williams is a 60 y.o. female here for follow up for uveitis and scleritis on methotrexate 15 mg PO weekly and Humira 40 mg [place] q14days. She has experienced small painless skin rashes in a few places including the left elbow these improved with conservative treatment moisturizing. She has left calf cramps at night ongoing no problems during the day or with mobility. Bilateral eye dryness is slightly worse than before during day time, using lubricating eye drops more than before. She has future appointment with Dr. Manuella Ghazi scheduled for next week.   Previous HPI 03/08/21 Kari Williams is a 60 y.o. female here for follow up for uveitis and scleritis on methotrexate 15 mg PO weekly and Humira 40 mg [place] q14days. Review of ophthalmology record on 01/24/21 disease appears quiescent on current medications without steroid treatment. At our last visit mild AST elevation and MCV elevation so increased folic acid supplementation to 2 mg PO daily. She developed pain in the upper back affecting right shoulder blade extending up to the back of the neck. She saw Dr. Berenice Primas in orthopedic surgery for this with oral meloxicam treatment and saw physical therapy getting some improvement so far.   06/07/20 Kari Williams is a 60 y.o. 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 She has a previous history of treatment with methotrexate for iritis in 2010-2015 with Dr. Herbert Deaner and Dr. Charlestine Night. She had been off any treatment but visit with Dr. 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  Since earlier this year she has experienced eye redness watering and blurry  vision.  She is more symptomatic complaint on the right than on the left currently but both sides bother her.  Besides the eye complaints she does also report some fairly diffuse arthralgias and morning stiffness up to 30 minutes.  She has not noticed any specific joint swelling warmth or erythema.  In the past she has also been treated with oral or ocular steroids but has not received any for the current relapse of symptoms.     Review of Systems  Constitutional:  Positive for fatigue.  HENT:  Negative for mouth dryness.   Eyes:  Positive for dryness.  Respiratory:  Negative for shortness of breath.   Cardiovascular:  Negative for swelling in legs/feet.  Gastrointestinal:  Negative for constipation.  Endocrine: Positive for heat intolerance.  Genitourinary:  Negative for difficulty urinating.  Musculoskeletal:  Positive for gait problem and morning stiffness.  Skin:  Negative for rash.  Allergic/Immunologic: Negative for susceptible to infections.  Neurological:  Positive for numbness.  Hematological:  Negative for bruising/bleeding tendency.  Psychiatric/Behavioral:  Positive for sleep disturbance.    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   High risk medication use 06/07/2020   Carpal tunnel syndrome 11/29/2015   Hypertensive retinopathy of both eyes 11/01/2015   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  Nuclear sclerotic cataract of both eyes 10/13/2015   Epiretinal membrane (ERM) of right eye 10/13/2015   Lumbosacral radiculitis 09/01/2014   Routine gynecological examination 11/19/2011   Abdominal or pelvic swelling, mass, or lump, generalized 11/19/2011   Overweight (BMI 25.0-29.9) 11/19/2011   Fibroids 11/19/2011   HTN (hypertension) 11/19/2011   Condyloma 11/19/2011    Past Medical History:  Diagnosis Date   Allergy     seasonal   Arthritis    osteoarthritis/ddd   Hypertension     Family History  Problem Relation Age of Onset   Colon cancer Brother    Dementia Mother    Hypotension Mother    Stroke Father    Breast cancer Sister    Hypertension Sister    Thyroid disease Sister    Lupus Paternal Aunt    Sleep apnea Brother    Stroke Brother    Lupus Niece    Past Surgical History:  Procedure Laterality Date   ABDOMINAL HYSTERECTOMY     CARPAL TUNNEL RELEASE  07/30/2000   right   COLONOSCOPY     LUMBAR [place] SURGERY  07/30/2004   LUMBAR FUSION  07/30/2009   THORACIC [place] SURGERY  07/30/2000   TUBAL LIGATION  07/02/2012   Procedure: BILATERAL TUBAL LIGATION;  Surgeon: Ena Dawley, MD;  Location: [facility];  Service: Gynecology;  Laterality: Bilateral;   UPPER GASTROINTESTINAL ENDOSCOPY     VAGINAL HYSTERECTOMY  07/02/2012   Procedure: HYSTERECTOMY VAGINAL;  Surgeon: Ena Dawley, MD;  Location: [facility];  Service: Gynecology;  Laterality: N/A;   Social History   Social History Narrative   Not on file   Immunization History  Administered Date(s) Administered   Influenza Split 07/03/2012   PFIZER(Purple Top)SARS-COV-2 Vaccination 10/13/2019, 11/03/2019     Objective: Vital Signs: BP 118/68 (BP Location: Left Arm, Patient Position: Sitting, Cuff Size: Normal)   Pulse 89   Resp 16   Ht 5\' 3"  (1.6 m)   Wt 186 lb (84.4 kg)   BMI 32.95 kg/m    Physical Exam Eyes:     Conjunctiva/sclera: Conjunctivae normal.  Cardiovascular:     Rate and Rhythm: Normal rate and regular rhythm.  Pulmonary:     Effort: Pulmonary effort is normal.     Breath sounds: Normal breath sounds.  Skin:    General: Skin is warm and dry.     Findings: No rash.     Musculoskeletal Exam:  Elbows full ROM no tenderness or swelling Wrists full ROM no tenderness or swelling Fingers full ROM no tenderness or swelling Knees full ROM no tenderness or swelling Ankles full ROM no tenderness or  swelling   Investigation: No additional findings.  Imaging: No results found.  Recent Labs: Lab Results  Component Value Date   WBC 7.6 06/14/2021   HGB 13.5 06/14/2021   PLT 344 06/14/2021   NA 140 06/14/2021   K 4.3 06/14/2021   CL 104 06/14/2021   CO2 28 06/14/2021   GLUCOSE 102 (H) 06/14/2021   BUN 9 06/14/2021   CREATININE 0.81 06/14/2021   BILITOT 0.8 06/14/2021   ALKPHOS 55 01/22/2010   AST 24 06/14/2021   ALT 20 06/14/2021   PROT 7.3 06/14/2021   ALBUMIN 2.7 (L) 01/22/2010   CALCIUM 9.8 06/14/2021   GFRAA 77 12/07/2020   QFTBGOLDPLUS NEGATIVE 06/07/2020    Speciality Comments: No specialty comments available.  Procedures:  No procedures performed Allergies: Penicillins   Assessment / Plan:     Visit Diagnoses: Bilateral  scleritis  Symptoms appear to be doing well no major recurrence of eye pain or inflammation as before.  She is having some irritation symptoms could be dryness related she has upcoming follow-up with Dr. Manuella Ghazi to repeat exam if inflammation is returning can discuss whether changes are needed.  Plan to continue methotrexate 12.5 mg p.o. weekly with folic acid 2 mg p.o. daily and Humira 40 mg subcu q. 14 days.  High risk medication use - Plan: CBC with Differential/Platelet, COMPLETE METABOLIC PANEL WITH GFR, QuantiFERON-TB Gold Plus  On long-term treatment with methotrexate and TNF inhibitor checking CBC CMP and TB screening for medication monitoring today.   Orders: Orders Placed This Encounter  Procedures   CBC with Differential/Platelet   COMPLETE METABOLIC PANEL WITH GFR   QuantiFERON-TB Gold Plus    No orders of the defined types were placed in this encounter.    Follow-Up Instructions: Return in about 3 months (around 09/14/2021) for Scleritis on MTX+ADA f/u 77mos.   Collier Salina, MD  Note - This record has been created using [employer].  Chart creation errors have been sought, but may not always  have been  located. Such creation errors do not reflect on  the standard of medical care.

## 2021-06-14 ENCOUNTER — Other Ambulatory Visit: Payer: Self-pay

## 2021-06-14 ENCOUNTER — Telehealth: Payer: Self-pay

## 2021-06-14 ENCOUNTER — Encounter: Payer: Self-pay | Admitting: Internal Medicine

## 2021-06-14 ENCOUNTER — Ambulatory Visit: Payer: BC Managed Care – PPO | Admitting: Internal Medicine

## 2021-06-14 VITALS — BP 118/68 | HR 89 | Resp 16 | Ht 63.0 in | Wt 186.0 lb

## 2021-06-14 DIAGNOSIS — H15003 Unspecified scleritis, bilateral: Secondary | ICD-10-CM | POA: Diagnosis not present

## 2021-06-14 DIAGNOSIS — Z79899 Other long term (current) drug therapy: Secondary | ICD-10-CM

## 2021-06-14 DIAGNOSIS — Z111 Encounter for screening for respiratory tuberculosis: Secondary | ICD-10-CM | POA: Diagnosis not present

## 2021-06-14 NOTE — Telephone Encounter (Signed)
PA automatically initiated by CMM.  Received notification from [place] regarding a prior authorization for Kari Williams. Authorization has been APPROVED from 05/15/2021 to 06/14/2022.   Patient must continue to fill through [place]: [phone]  Authorization # [id number] CMM Key: [id number]

## 2021-06-16 ENCOUNTER — Other Ambulatory Visit: Payer: Self-pay | Admitting: *Deleted

## 2021-06-16 LAB — COMPLETE METABOLIC PANEL WITH GFR
AG Ratio: 1.5 (calc) (ref 1.0–2.5)
ALT: 20 U/L (ref 6–29)
AST: 24 U/L (ref 10–35)
Albumin: 4.4 g/dL (ref 3.6–5.1)
Alkaline phosphatase (APISO): 65 U/L (ref 37–153)
BUN: 9 mg/dL (ref 7–25)
CO2: 28 mmol/L (ref 20–32)
Calcium: 9.8 mg/dL (ref 8.6–10.4)
Chloride: 104 mmol/L (ref 98–110)
Creat: 0.81 mg/dL (ref 0.50–1.05)
Globulin: 2.9 g/dL (calc) (ref 1.9–3.7)
Glucose, Bld: 102 mg/dL — ABNORMAL HIGH (ref 65–99)
Potassium: 4.3 mmol/L (ref 3.5–5.3)
Sodium: 140 mmol/L (ref 135–146)
Total Bilirubin: 0.8 mg/dL (ref 0.2–1.2)
Total Protein: 7.3 g/dL (ref 6.1–8.1)
eGFR: 82 mL/min/{1.73_m2} (ref >=60)

## 2021-06-16 LAB — CBC WITH DIFFERENTIAL/PLATELET
Absolute Monocytes: 654 cells/uL (ref 200–950)
Basophils Absolute: 76 cells/uL (ref 0–200)
Basophils Relative: 1 %
Eosinophils Absolute: 53 cells/uL (ref 15–500)
Eosinophils Relative: 0.7 %
HCT: 39.9 % (ref 35.0–45.0)
Hemoglobin: 13.5 g/dL (ref 11.7–15.5)
Lymphs Abs: 2166 cells/uL (ref 850–3900)
MCH: 34.3 pg — ABNORMAL HIGH (ref 27.0–33.0)
MCHC: 33.8 g/dL (ref 32.0–36.0)
MCV: 101.3 fL — ABNORMAL HIGH (ref 80.0–100.0)
MPV: 9.7 fL (ref 7.5–12.5)
Monocytes Relative: 8.6 %
Neutro Abs: 4651 cells/uL (ref 1500–7800)
Neutrophils Relative %: 61.2 %
Platelets: 344 10*3/uL (ref 140–400)
RBC: 3.94 10*6/uL (ref 3.80–5.10)
RDW: 12.2 % (ref 11.0–15.0)
Total Lymphocyte: 28.5 %
WBC: 7.6 10*3/uL (ref 3.8–10.8)

## 2021-06-16 LAB — QUANTIFERON-TB GOLD PLUS
Mitogen-NIL: 6.29 IU/mL
NIL: 0.05 IU/mL
QuantiFERON-TB Gold Plus: NEGATIVE
TB1-NIL: 0 IU/mL
TB2-NIL: 0 IU/mL

## 2021-06-18 NOTE — Progress Notes (Signed)
CBC and CMP look fine to continue current methotrexate and Humira. TB screen is negative.

## 2021-07-06 ENCOUNTER — Other Ambulatory Visit: Payer: Self-pay

## 2021-07-06 DIAGNOSIS — H15003 Unspecified scleritis, bilateral: Secondary | ICD-10-CM

## 2021-07-06 DIAGNOSIS — H2013 Chronic iridocyclitis, bilateral: Secondary | ICD-10-CM

## 2021-07-06 MED ORDER — METHOTREXATE 2.5 MG PO TABS
12.5000 mg | ORAL_TABLET | ORAL | 0 refills | Status: DC
Start: 2021-07-06 — End: 2021-10-05

## 2021-07-06 MED ORDER — FOLIC ACID 1 MG PO TABS: 2.0000 mg | ORAL_TABLET | Freq: Every day | ORAL | 1 refills | Status: DC

## 2021-07-06 NOTE — Telephone Encounter (Signed)
Next Visit: 09/13/2021  Last Visit: 06/14/2021  Last Fill: 03/18/2021 (MTX) 0/27/2536 (folic acid)  DX:  Bilateral scleritis  Current Dose per office note 06/14/2021: methotrexate 12.5 mg p.o. weekly with folic acid 2 mg p.o. daily   Labs: 06/14/2021 CBC and CMP look fine to continue current methotrexate and Humira  Okay to refill MTX and Folic Acid?

## 2021-07-06 NOTE — Telephone Encounter (Signed)
[facility] left a voicemail requesting prescription refills of Folic Acid and Methotrexate for the patient.  A refill request was faxed to the office.  Phone [phone]

## 2021-08-01 DIAGNOSIS — H2513 Age-related nuclear cataract, bilateral: Secondary | ICD-10-CM | POA: Diagnosis not present

## 2021-08-01 DIAGNOSIS — H35371 Puckering of macula, right eye: Secondary | ICD-10-CM | POA: Diagnosis not present

## 2021-08-01 DIAGNOSIS — H15002 Unspecified scleritis, left eye: Secondary | ICD-10-CM | POA: Diagnosis not present

## 2021-08-01 DIAGNOSIS — H35033 Hypertensive retinopathy, bilateral: Secondary | ICD-10-CM | POA: Diagnosis not present

## 2021-08-11 DIAGNOSIS — Z13220 Encounter for screening for lipoid disorders: Secondary | ICD-10-CM | POA: Diagnosis not present

## 2021-08-11 DIAGNOSIS — E663 Overweight: Secondary | ICD-10-CM | POA: Diagnosis not present

## 2021-08-11 DIAGNOSIS — Z0000 Encounter for general adult medical examination without abnormal findings: Secondary | ICD-10-CM | POA: Diagnosis not present

## 2021-08-11 DIAGNOSIS — F324 Major depressive disorder, single episode, in partial remission: Secondary | ICD-10-CM | POA: Diagnosis not present

## 2021-08-11 DIAGNOSIS — I10 Essential (primary) hypertension: Secondary | ICD-10-CM | POA: Diagnosis not present

## 2021-08-15 IMAGING — CR DG CHEST 2V
2 series · 2 of 2 positions shown · non-contrast
Comparison: 07/18/2016.

CLINICAL DATA: Baseline for methotrexate monitoring.

EXAM:
CHEST - 2 VIEW

[w chest pa]
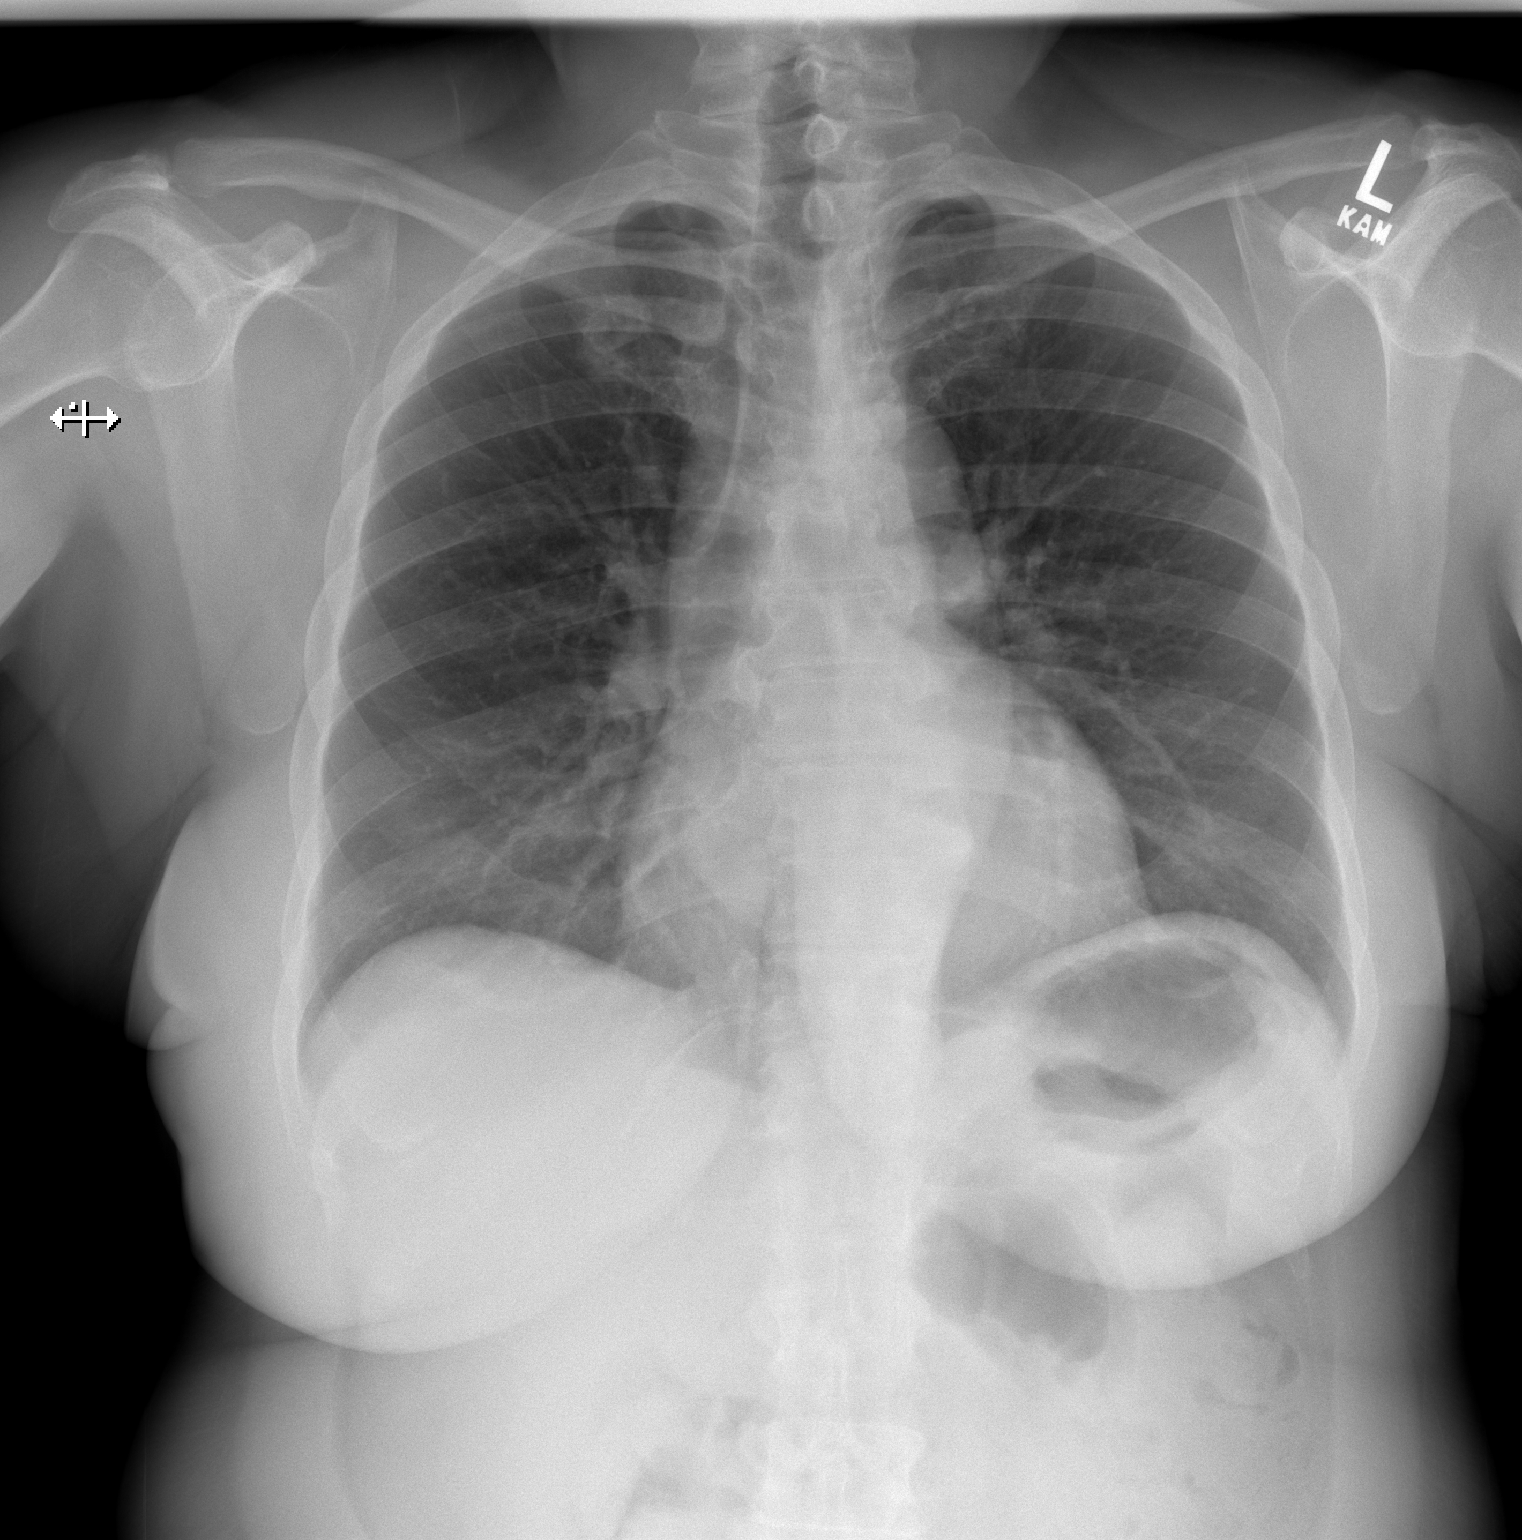

[w chest lat]
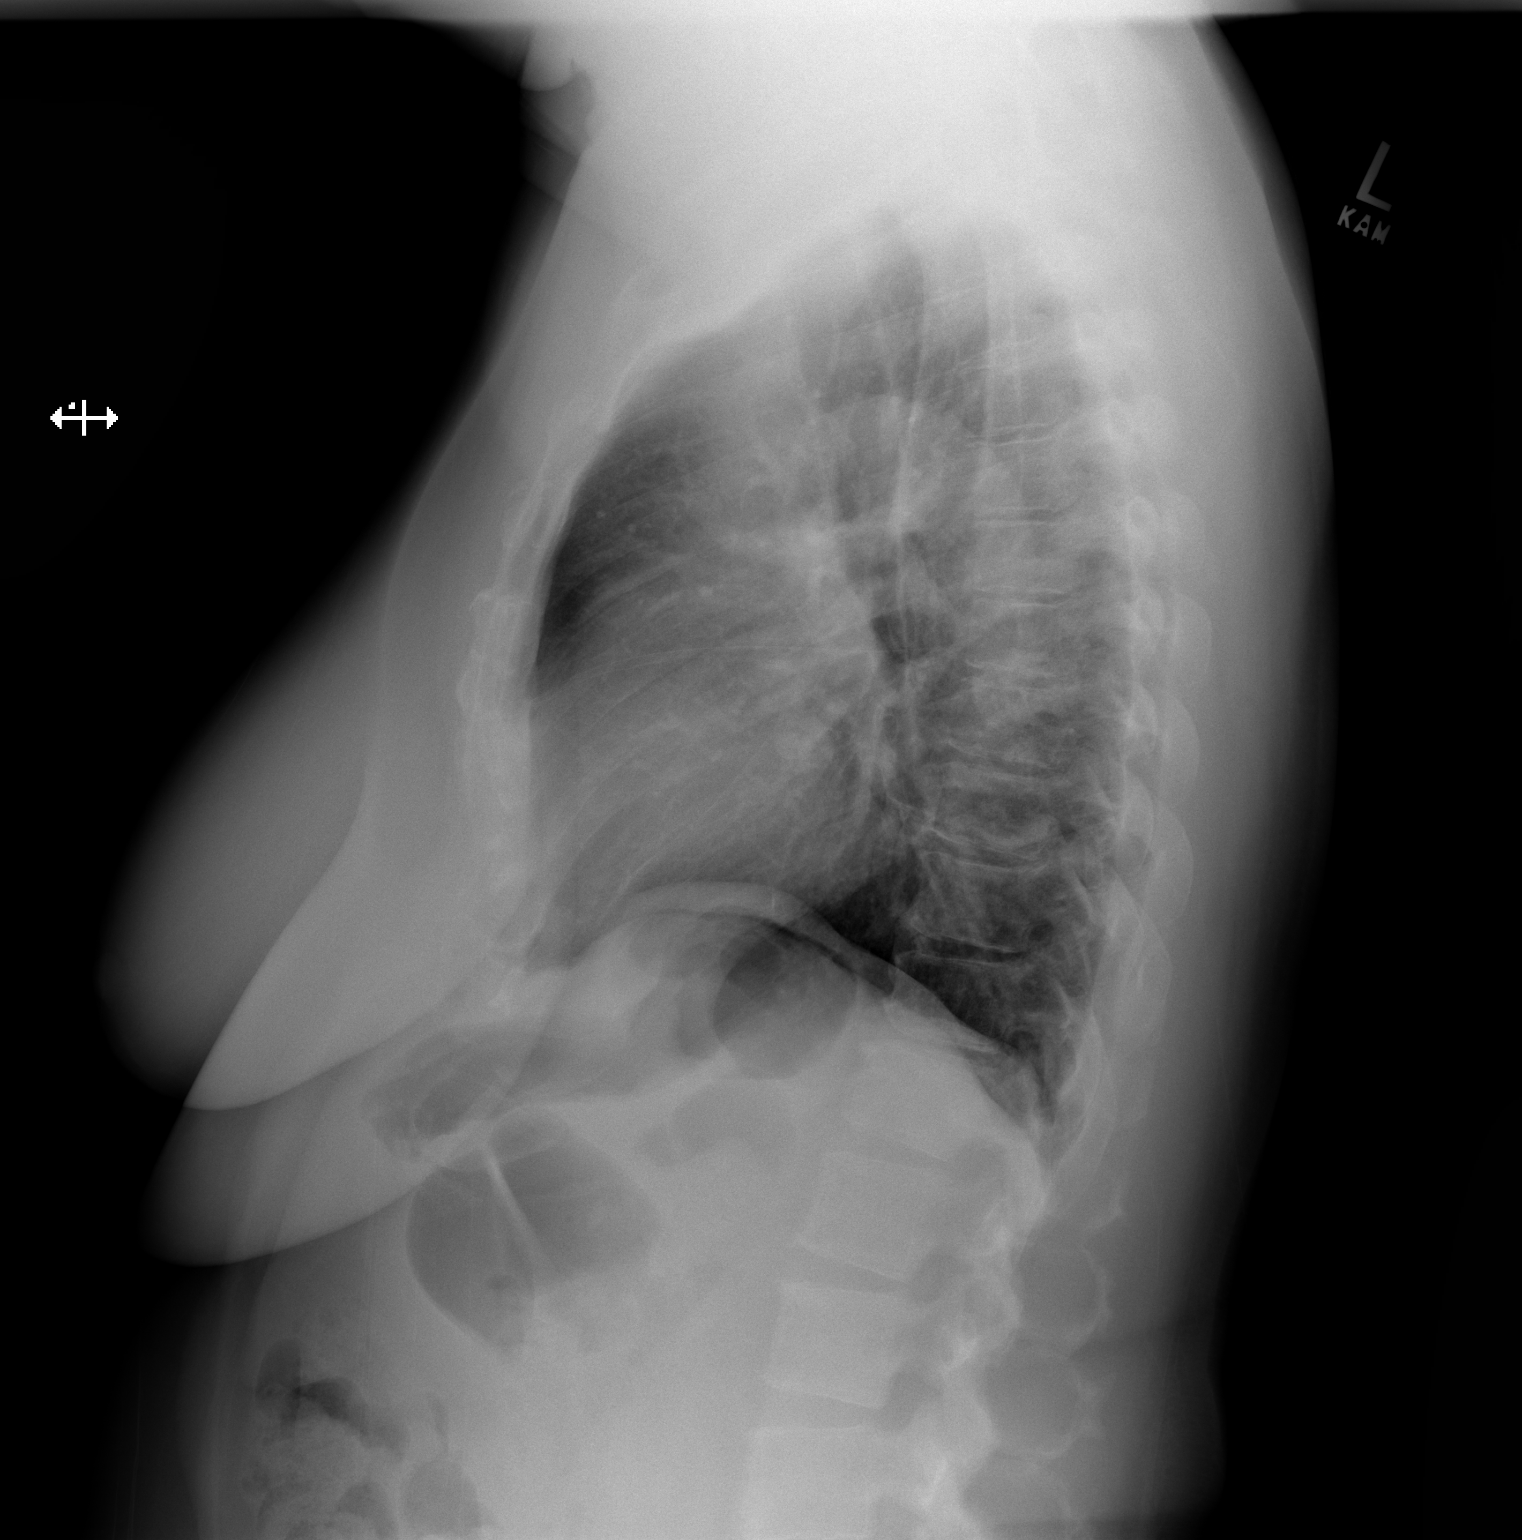

[2 of 2 positions shown; findings below may reference images not displayed]

FINDINGS: Mediastinum and hilar structures normal. Heart size normal. No
pulmonary venous congestion. No focal infiltrate. No pleural
effusion or pneumothorax. Degenerative change thoracic spine. No
acute bony abnormality identified.
IMPRESSION: No acute cardiopulmonary disease.  Chest is stable from prior exam.

## 2021-08-20 ENCOUNTER — Other Ambulatory Visit: Payer: Self-pay | Admitting: Internal Medicine

## 2021-08-20 DIAGNOSIS — H2013 Chronic iridocyclitis, bilateral: Secondary | ICD-10-CM

## 2021-08-20 DIAGNOSIS — H15003 Unspecified scleritis, bilateral: Secondary | ICD-10-CM

## 2021-09-13 ENCOUNTER — Other Ambulatory Visit: Payer: Self-pay

## 2021-09-13 ENCOUNTER — Encounter: Payer: Self-pay | Admitting: Internal Medicine

## 2021-09-13 ENCOUNTER — Ambulatory Visit: Payer: BC Managed Care – PPO | Admitting: Internal Medicine

## 2021-09-13 VITALS — BP 149/78 | HR 84 | Resp 15 | Ht 63.0 in | Wt 186.0 lb

## 2021-09-13 DIAGNOSIS — H15003 Unspecified scleritis, bilateral: Secondary | ICD-10-CM | POA: Diagnosis not present

## 2021-09-13 DIAGNOSIS — H9201 Otalgia, right ear: Secondary | ICD-10-CM | POA: Diagnosis not present

## 2021-09-13 DIAGNOSIS — M79645 Pain in left finger(s): Secondary | ICD-10-CM

## 2021-09-13 DIAGNOSIS — Z79899 Other long term (current) drug therapy: Secondary | ICD-10-CM

## 2021-09-13 LAB — CBC WITH DIFFERENTIAL/PLATELET
Absolute Monocytes: 800 cells/uL (ref 200–950)
Basophils Absolute: 40 cells/uL (ref 0–200)
Basophils Relative: 0.5 %
Eosinophils Absolute: 72 cells/uL (ref 15–500)
Eosinophils Relative: 0.9 %
HCT: 38.5 % (ref 35.0–45.0)
Hemoglobin: 12.8 g/dL (ref 11.7–15.5)
Lymphs Abs: 3168 cells/uL (ref 850–3900)
MCH: 33.6 pg — ABNORMAL HIGH (ref 27.0–33.0)
MCHC: 33.2 g/dL (ref 32.0–36.0)
MCV: 101 fL — ABNORMAL HIGH (ref 80.0–100.0)
MPV: 9.4 fL (ref 7.5–12.5)
Monocytes Relative: 10 %
Neutro Abs: 3920 cells/uL (ref 1500–7800)
Neutrophils Relative %: 49 %
Platelets: 323 10*3/uL (ref 140–400)
RBC: 3.81 10*6/uL (ref 3.80–5.10)
RDW: 12.8 % (ref 11.0–15.0)
Total Lymphocyte: 39.6 %
WBC: 8 10*3/uL (ref 3.8–10.8)

## 2021-09-13 LAB — COMPLETE METABOLIC PANEL WITH GFR
AG Ratio: 1.5 (calc) (ref 1.0–2.5)
ALT: 24 U/L (ref 6–29)
AST: 24 U/L (ref 10–35)
Albumin: 4.6 g/dL (ref 3.6–5.1)
Alkaline phosphatase (APISO): 66 U/L (ref 37–153)
BUN: 11 mg/dL (ref 7–25)
CO2: 32 mmol/L (ref 20–32)
Calcium: 9.8 mg/dL (ref 8.6–10.4)
Chloride: 107 mmol/L (ref 98–110)
Creat: 0.87 mg/dL (ref 0.50–1.05)
Globulin: 3 g/dL (calc) (ref 1.9–3.7)
Glucose, Bld: 76 mg/dL (ref 65–99)
Potassium: 4.6 mmol/L (ref 3.5–5.3)
Sodium: 143 mmol/L (ref 135–146)
Total Bilirubin: 0.4 mg/dL (ref 0.2–1.2)
Total Protein: 7.6 g/dL (ref 6.1–8.1)
eGFR: 75 mL/min/{1.73_m2} (ref >=60)

## 2021-09-13 LAB — SEDIMENTATION RATE: Sed Rate: 14 mm/h (ref 0–30)

## 2021-09-13 NOTE — Progress Notes (Signed)
Office Visit Note  Patient: Kari Williams             Date of Birth: 07/02/1961           MRN: [id number]             PCP: Seward Carol, MD Referring: Seward Carol, MD Visit Date: 09/13/2021   Subjective:  Follow-up (Left hand pain and swelling, gland swollen behind right ear x 5 days)   History of Present Illness: Kari Williams is a 61 y.o. female here for follow up for uveiitis and scleritis on methotrexate 15 mg PO weekly and Humira [place] q14days. Last visit with Dr. Manuella Ghazi 08/01/21. Eyes are doing okay without new complaints and exam was okay. She has noticed some increased pain and swelling affecting left hand fingers also mildly tender nodule swelling behind her left ear.  Previous HPI 06/14/21 Kari Williams is a 61 y.o. female here for follow up for uveitis and scleritis on methotrexate 15 mg PO weekly and Humira 40 mg [place] q14days. She has experienced small painless skin rashes in a few places including the left elbow these improved with conservative treatment moisturizing. She has left calf cramps at night ongoing no problems during the day or with mobility. Bilateral eye dryness is slightly worse than before during day time, using lubricating eye drops more than before. She has future appointment with Dr. Manuella Ghazi scheduled for next week.   Previous HPI 06/07/20 Kari Williams is a 61 y.o. 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 She has a previous history of treatment with methotrexate for iritis in 2010-2015 with Dr. Herbert Deaner and Dr. Charlestine Night. She had been off any treatment but visit with Dr. 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  Since earlier this year she has experienced eye redness watering and blurry vision.  She is more symptomatic complaint on the right than on the left currently but both sides bother her.  Besides the eye complaints she does also report some fairly diffuse  arthralgias and morning stiffness up to 30 minutes.  She has not noticed any specific joint swelling warmth or erythema.  In the past she has also been treated with oral or ocular steroids but has not received any for the current relapse of symptoms.   Review of Systems  Constitutional:  Positive for fatigue.  HENT:  Positive for mouth dryness.   Eyes:  Positive for dryness.  Respiratory:  Negative for shortness of breath.   Cardiovascular:  Negative for swelling in legs/feet.  Gastrointestinal:  Negative for constipation.  Endocrine: Positive for heat intolerance.  Genitourinary:  Negative for difficulty urinating.  Musculoskeletal:  Positive for joint pain, joint pain, joint swelling and morning stiffness.  Skin:  Positive for hair loss and nodules/bumps.  Allergic/Immunologic: Negative for susceptible to infections.  Neurological:  Positive for numbness.  Hematological:  Negative for bruising/bleeding tendency.  Psychiatric/Behavioral:  Positive for sleep disturbance.    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   High risk medication use 06/07/2020   Carpal tunnel syndrome 11/29/2015   Hypertensive retinopathy of both eyes 11/01/2015   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   Nuclear sclerotic cataract of both eyes 10/13/2015  Epiretinal membrane (ERM) of right eye 10/13/2015   Lumbosacral radiculitis 09/01/2014   Routine gynecological examination 11/19/2011   Abdominal or pelvic swelling, mass, or lump, generalized 11/19/2011   Overweight (BMI 25.0-29.9) 11/19/2011   Fibroids 11/19/2011   HTN (hypertension) 11/19/2011   Condyloma 11/19/2011    Past Medical History:  Diagnosis Date   Allergy    seasonal   Arthritis    osteoarthritis/ddd   Hypertension      Family History  Problem Relation Age of Onset   Colon cancer Brother    Dementia Mother    Hypotension Mother    Stroke Father    Breast cancer Sister    Hypertension Sister    Thyroid disease Sister    Lupus Paternal Aunt    Sleep apnea Brother    Stroke Brother    Lupus Niece    Past Surgical History:  Procedure Laterality Date   ABDOMINAL HYSTERECTOMY     CARPAL TUNNEL RELEASE  07/30/2000   right   COLONOSCOPY     LUMBAR [place] SURGERY  07/30/2004   LUMBAR FUSION  07/30/2009   THORACIC [place] SURGERY  07/30/2000   TUBAL LIGATION  07/02/2012   Procedure: BILATERAL TUBAL LIGATION;  Surgeon: Ena Dawley, MD;  Location: [facility];  Service: Gynecology;  Laterality: Bilateral;   UPPER GASTROINTESTINAL ENDOSCOPY     VAGINAL HYSTERECTOMY  07/02/2012   Procedure: HYSTERECTOMY VAGINAL;  Surgeon: Ena Dawley, MD;  Location: [facility];  Service: Gynecology;  Laterality: N/A;   Social History   Social History Narrative   Not on file   Immunization History  Administered Date(s) Administered   Influenza Split 07/03/2012   PFIZER(Purple Top)SARS-COV-2 Vaccination 10/13/2019, 11/03/2019     Objective: Vital Signs: BP (!) 149/78 (BP Location: Left Arm, Patient Position: Sitting, Cuff Size: Normal)    Pulse 84    Resp 15    Ht 5\' 3"  (1.6 m)    Wt 186 lb (84.4 kg)    BMI 32.95 kg/m    Physical Exam Cardiovascular:     Rate and Rhythm: Normal rate and regular rhythm.  Pulmonary:     Effort: Pulmonary effort is normal.     Breath sounds: Normal breath sounds.  Skin:    General: Skin is warm and dry.     Findings: No rash.  Neurological:     Mental Status: She is alert.  Psychiatric:        Mood and Affect: Mood normal.     Musculoskeletal Exam:  Shoulders full ROM no tenderness or swelling Elbows full ROM no tenderness or swelling Wrists full ROM no tenderness or swelling Fingers left hand MCP joint tenderness to pressure no palpable synovitis Knees full ROM no  tenderness or swelling Ankles full ROM no tenderness or swelling   Investigation: No additional findings.  Imaging: No results found.  Recent Labs: Lab Results  Component Value Date   WBC 8.0 09/13/2021   HGB 12.8 09/13/2021   PLT 323 09/13/2021   NA 143 09/13/2021   K 4.6 09/13/2021   CL 107 09/13/2021   CO2 32 09/13/2021   GLUCOSE 76 09/13/2021   BUN 11 09/13/2021   CREATININE 0.87 09/13/2021   BILITOT 0.4 09/13/2021   ALKPHOS 55 01/22/2010   AST 24 09/13/2021   ALT 24 09/13/2021   PROT 7.6 09/13/2021   ALBUMIN 2.7 (L) 01/22/2010   CALCIUM 9.8 09/13/2021   GFRAA 77 12/07/2020   QFTBGOLDPLUS NEGATIVE 06/14/2021    Speciality Comments: No  specialty comments available.  Procedures:  No procedures performed Allergies: Penicillins   Assessment / Plan:     Visit Diagnoses: Pain in left finger(s) - Plan: Sedimentation rate  Mild joint pain may be some RA activity fairly recent and not severely limiting at this time.  Checking sed rate today for activity monitoring. Okay to observe for now. Definitely plan to continue dual therapy at this time methotrexate 12.5 mg PO weekly and Humira 40 mg q14days.  Bilateral scleritis  Well controlled per last ophthalmology exam 08/01/21.  High risk medication use - Plan: CBC with Differential/Platelet, COMPLETE METABOLIC PANEL WITH GFR  Checking CBC and CMP for methotrexate and Humira toxicity monitoring. No known interval infections although adenopathy may be from mild URI.  Right ear pain - Plan: Sedimentation rate  Ear pain and feels like swollen lymph node behind right ear suspect some URI infection or inflammation. Isolated unilateral adenopathy not typical of RA flare. If this worsens would need to follow up with primary care but with limited duration and severity so far okay to monitor.  Orders: Orders Placed This Encounter  Procedures   Sedimentation rate   CBC with Differential/Platelet   COMPLETE METABOLIC PANEL WITH  GFR   No orders of the defined types were placed in this encounter.    Follow-Up Instructions: Return in about 3 months (around 12/11/2021) for Scleritis/RA on MTX/ADA f/u 73mos.   Collier Salina, MD  Note - This record has been created using [employer].  Chart creation errors have been sought, but may not always  have been located. Such creation errors do not reflect on  the standard of medical care.

## 2021-09-14 NOTE — Progress Notes (Signed)
Lab results all look fine, no problem continuing the methotrexate and Humira.  I recommend she can try taking ibuprofen or aleve as discussed to see if this improves her finger pain. If it gets worse or more swollen or affecting other areas she can let us know, since I think trying steroids for a few days would probably help if needed.

## 2021-10-04 ENCOUNTER — Other Ambulatory Visit: Payer: Self-pay | Admitting: Internal Medicine

## 2021-10-04 DIAGNOSIS — H2013 Chronic iridocyclitis, bilateral: Secondary | ICD-10-CM

## 2021-10-04 DIAGNOSIS — H15003 Unspecified scleritis, bilateral: Secondary | ICD-10-CM

## 2021-11-27 ENCOUNTER — Ambulatory Visit: Payer: BC Managed Care – PPO | Admitting: Podiatrist

## 2021-11-27 ENCOUNTER — Encounter: Payer: Self-pay | Admitting: Podiatrist

## 2021-11-27 DIAGNOSIS — L84 Corns and callosities: Secondary | ICD-10-CM | POA: Diagnosis not present

## 2021-11-27 DIAGNOSIS — M2041 Other hammer toe(s) (acquired), right foot: Secondary | ICD-10-CM | POA: Diagnosis not present

## 2021-11-27 NOTE — Patient Instructions (Signed)
Corns and Calluses Corns are small areas of thickened skin that form on the top, sides, or tip of a toe. Corns have a cone-shaped core with a point that can press on a nerve below. This causes pain. Calluses are areas of thickened skin that can form anywhere on the body, including the hands, fingers, palms, soles of the feet, and heels. Calluses are usually larger than corns. What are the causes? Corns and calluses are caused by rubbing (friction) or pressure, such as from shoes that are too tight or do not fit properly. What increases the risk? Corns are more likely to develop in people who have misshapen toes (toe deformities), such as hammer toes. Calluses can form with friction to any area of the skin. They are more likely to develop in people who: Work with their hands. Wear shoes that fit poorly, are too tight, or are high-heeled. Have toe deformities. What are the signs or symptoms? Symptoms of a corn or callus include: A hard growth on the skin. Pain or tenderness under the skin. Redness and swelling. Increased discomfort while wearing tight-fitting shoes, if your feet are affected. If a corn or callus becomes infected, symptoms may include: Redness and swelling that gets worse. Pain. Fluid, blood, or pus draining from the corn or callus. How is this diagnosed? Corns and calluses may be diagnosed based on your symptoms, your medical history, and a physical exam. How is this treated? Treatment for corns and calluses may include: Removing the cause of the friction or pressure. This may involve: Changing your shoes. Wearing shoe inserts (orthotics) or other protective layers in your shoes, such as a corn pad. Wearing gloves. Applying medicine to the skin (topical medicine) to help soften skin in the hardened, thickened areas. Removing layers of dead skin with a file to reduce the size of the corn or callus. Removing the corn or callus with a scalpel or laser. Taking antibiotic  medicines, if your corn or callus is infected. Having surgery, if a toe deformity is the cause. Follow these instructions at home:  Take over-the-counter and prescription medicines only as told by your health care provider. If you were prescribed an antibiotic medicine, take it as told by your health care provider. Do not stop taking it even if your condition improves. Wear shoes that fit well. Avoid wearing high-heeled shoes and shoes that are too tight or too loose. Wear any padding, protective layers, gloves, or orthotics as told by your health care provider. Soak your hands or feet. Then use a file or pumice stone to soften your corn or callus. Do this as told by your health care provider. Check your corn or callus every day for signs of infection. Contact a health care provider if: Your symptoms do not improve with treatment. You have redness or swelling that gets worse. Your corn or callus becomes painful. You have fluid, blood, or pus coming from your corn or callus. You have new symptoms. Get help right away if: You develop severe pain with redness. Summary Corns are small areas of thickened skin that form on the top, sides, or tip of a toe. These can be painful. Calluses are areas of thickened skin that can form anywhere on the body, including the hands, fingers, palms, and soles of the feet. Calluses are usually larger than corns. Corns and calluses are caused by rubbing (friction) or pressure, such as from shoes that are too tight or do not fit properly. Treatment may include wearing padding, protective   layers, gloves, or orthotics as told by your health care provider. This information is not intended to replace advice given to you by your health care provider. Make sure you discuss any questions you have with your health care provider. Document Revised: 11/12/2019 Document Reviewed: 11/12/2019 Elsevier Patient Education  2023 [employer]  

## 2021-11-27 NOTE — Progress Notes (Signed)
Chief Complaint  ?Patient presents with  ? Callouses  ?  ? ?HPI: Patient is 61 y.o. female who presents today for bothersome calluses on the medial sides of both great toes and lateral fifth toes. She has had 4 back surgeries and had developed a drop foot on the left side that has improved however she has to think about heel toe walking.  She cares for her calluses with a pumices stone at home.  ? ? ?Allergies  ?Allergen Reactions  ? Penicillins Hives  ? ? ?Review of systems is negative except as noted in the HPI.  Denies nausea/ vomiting/ fevers/ chills or night sweats.   Denies difficulty breathing, denies calf pain or tenderness ? ?Physical Exam ? ?Patient is awake, alert, and oriented x 3.  In no acute distress.   ? ?Vascular status is intact with palpable pedal pulses DP and PT bilateral and capillary refill time less than 3 seconds bilateral.  No edema or erythema noted.  ? ?Neurological exam reveals epicritic and protective sensation grossly intact bilateral.  ? ?Dermatological exam reveals skin is supple and dry to bilateral feet.  Callus present medial hallux left worse than right.  Bilateral fifth digits have listers corns present.  ? ?Musculoskeletal exam: Musculature intact with dorsiflexion, plantarflexion, inversion, eversion. Ankle and First MPJ joint range of motion normal.  Mild hammertoe second toe right foot ? ? ? ?Assessment: ?  ICD-10-CM   ?1. Corns and callosities  L84   ?  ?2. Hammertoe of second toe of right foot  M20.41   ?  ? ? ? ?Plan: ?Discussed exam findings and pared the lesions with a 15 blade without complications.  Discussed at home care of the calluses and she will call if the areas become bothersome again in the future.  Briefly discussed surgical options-  she will hold off unless it becomes painful in the future.   ? ?

## 2021-12-04 NOTE — Progress Notes (Signed)
? ?Office Visit Note ? ?Patient: Kari Williams             ?Date of Birth: 02/12/1961           ?MRN: [id number]             ?PCP: Seward Carol, MD ?Referring: Seward Carol, MD ?Visit Date: 12/13/2021 ? ? ?Subjective:  ?Follow-up (Doing good) ? ? ?History of Present Illness: Kari Williams is a 61 y.o. female here for follow up for uveiitis and scleritis on methotrexate 12.5 mg PO weekly and Humira [place] q14days. Since our last visit her finger pain is improved not needing any additional NSAIDs for symptom management. She has floaters but no new visual complaint no eye redness or pain. ? ?Previous HPI ?09/13/2021 ?Kari Williams is a 61 y.o. female here for follow up for uveiitis and scleritis on methotrexate 15 mg PO weekly and Humira [place] q14days. Last visit with Dr. Manuella Ghazi 08/01/21. Eyes are doing okay without new complaints and exam was okay. She has noticed some increased pain and swelling affecting left hand fingers also mildly tender nodule swelling behind her left ear. ?  ?Previous HPI ?06/14/21 ?Kari Williams is a 61 y.o. female here for follow up for uveitis and scleritis on methotrexate 15 mg PO weekly and Humira 40 mg  q14days. She has experienced small painless skin rashes in a few places including the left elbow these improved with conservative treatment moisturizing. She has left calf cramps at night ongoing no problems during the day or with mobility. Bilateral eye dryness is slightly worse than before during day time, using lubricating eye drops more than before. She has future appointment with Dr. Manuella Ghazi scheduled for next week. ?  ?Previous HPI ?06/07/20 ?Kari Williams is a 61 y.o. 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 She has a previous history of treatment with methotrexate for iritis in 2010-2015 with Dr. Herbert Deaner and Dr. Charlestine Night. She had been off any treatment but visit with Dr. Manuella Ghazi in 12/2019 found active left eye scleritis and recommendation to resume  treatment for inflammatory disease.  Since earlier this year she has experienced eye redness watering and blurry vision.  She is more symptomatic complaint on the right than on the left currently but both sides bother her.  Besides the eye complaints she does also report some fairly diffuse arthralgias and morning stiffness up to 30 minutes.  She has not noticed any specific joint swelling warmth or erythema.  In the past she has also been treated with oral or ocular steroids but has not received any for the current relapse of symptoms. ? ? ?Review of Systems  ?Constitutional:  Positive for fatigue.  ?HENT:  Positive for mouth dryness.   ?Eyes:  Positive for dryness.  ?Respiratory:  Negative for shortness of breath.   ?Cardiovascular:  Negative for swelling in legs/feet.  ?Gastrointestinal:  Negative for constipation.  ?Endocrine: Positive for heat intolerance.  ?Genitourinary:  Negative for difficulty urinating.  ?Musculoskeletal:  Positive for morning stiffness.  ?Skin:  Negative for rash.  ?Allergic/Immunologic: Negative for susceptible to infections.  ?Neurological:  Negative for numbness.  ?Hematological:  Negative for bruising/bleeding tendency.  ?Psychiatric/Behavioral:  Positive for sleep disturbance.   ? ?PMFS History:  ?Patient Active Problem List  ? Diagnosis Date Noted  ? Pain in left finger(s) 09/13/2021  ? Right ear pain 09/13/2021  ? Hemorrhage of rectum and anus 03/08/2021  ? Pain in right shoulder 03/08/2021  ? 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 High risk medication use 06/07/2020  ? Carpal tunnel syndrome 11/29/2015  ? Hypertensive retinopathy of both eyes 11/01/2015  ? 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 Nuclear sclerotic cataract of both eyes 10/13/2015  ? Epiretinal membrane (ERM) of right eye 10/13/2015  ? Lumbosacral radiculitis 09/01/2014  ? Routine gynecological examination 11/19/2011  ? Abdominal or pelvic swelling, mass, or lump, generalized 11/19/2011  ? Overweight (BMI  25.0-29.9) 11/19/2011  ? Fibroids 11/19/2011  ? HTN (hypertension) 11/19/2011  ? Condyloma 11/19/2011  ?  ?Past Medical History:  ?Diagnosis Date  ? Allergy   ? seasonal  ? 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 Hypertension   ?  ?Family History  ?Problem Relation Age of Onset  ? Colon cancer Brother   ? Dementia Mother   ? Hypotension Mother   ? Stroke Father   ? Breast cancer Sister   ? Hypertension Sister   ? Thyroid disease Sister   ? Lupus Paternal Aunt   ? Sleep apnea Brother   ? Stroke Brother   ? Lupus Niece   ? ?Past Surgical History:  ?Procedure Laterality Date  ? ABDOMINAL HYSTERECTOMY    ? CARPAL TUNNEL RELEASE  07/30/2000  ? right  ? COLONOSCOPY    ? [place] SURGERY  07/30/2004  ? LUMBAR FUSION  07/30/2009  ? THORACIC [place] SURGERY  07/30/2000  ? TUBAL LIGATION  07/02/2012  ? Procedure: BILATERAL TUBAL LIGATION;  Surgeon: Ena Dawley, MD;  Location: [facility];  Service: Gynecology;  Laterality: Bilateral;  ? UPPER GASTROINTESTINAL ENDOSCOPY    ? VAGINAL HYSTERECTOMY  07/02/2012  ? Procedure: HYSTERECTOMY VAGINAL;  Surgeon: Ena Dawley, MD;  Location: [facility];  Service: Gynecology;  Laterality: N/A;  ? ?Social History  ? ?Social History Narrative  ? Not on file  ? ?Immunization History  ?Administered Date(s) Administered  ? Influenza Split 07/03/2012  ? PFIZER(Purple Top)SARS-COV-2 Vaccination 10/13/2019, 11/03/2019  ?  ? ?Objective: ?Vital Signs: BP 133/77 (BP Location: Left Arm, Patient Position: Sitting, Cuff Size: Normal)   Pulse 80   Resp 15   Ht '5\' 3"'$  (1.6 m)   Wt 186 lb (84.4 kg)   BMI 32.95 kg/m?   ? ?Physical Exam ?Constitutional:   ?   Appearance: She is obese.  ?Cardiovascular:  ?   Rate and Rhythm: Normal rate and regular rhythm.  ?Pulmonary:  ?   Effort: Pulmonary effort is normal.  ?   Breath sounds: Normal breath sounds.  ?Musculoskeletal:  ?   Right lower leg: No edema.  ?   Left lower leg: No edema.  ?Skin: ?   General: Skin is warm and dry.  ?   Findings: No rash.   ?Neurological:  ?   Mental Status: She is alert.  ?Psychiatric:     ?   Mood and Affect: Mood normal.  ?  ? ?Musculoskeletal Exam:  ?Shoulders full ROM no tenderness or swelling ?Elbows full ROM no tenderness or swelling ?Wrists full ROM no tenderness or swelling ?Fingers full ROM no tenderness or swelling ?Knees full ROM no tenderness or swelling ?Ankles full ROM no tenderness or swelling ? ? ?Investigation: ?No additional findings. ? ?Imaging: ?No results found. ? ?Recent Labs: ?Lab Results  ?Component Value Date  ? WBC 8.0 09/13/2021  ? HGB 12.8 09/13/2021  ? PLT 323 09/13/2021  ? NA 143 09/13/2021  ? K 4.6 09/13/2021  ? CL 107 09/13/2021  ? CO2 32 09/13/2021  ? GLUCOSE 76 09/13/2021  ?  BUN 11 09/13/2021  ? CREATININE 0.87 09/13/2021  ? BILITOT 0.4 09/13/2021  ? ALKPHOS 55 01/22/2010  ? AST 24 09/13/2021  ? ALT 24 09/13/2021  ? PROT 7.6 09/13/2021  ? ALBUMIN 2.7 (L) 01/22/2010  ? CALCIUM 9.8 09/13/2021  ? GFRAA 77 12/07/2020  ? QFTBGOLDPLUS NEGATIVE 06/14/2021  ? ? ?Speciality Comments: No specialty comments available. ? ?Procedures:  ?No procedures performed ?Allergies: Penicillins  ? ?Assessment / Plan:     ?Visit Diagnoses: Bilateral scleritis - Well controlled per last ophthalmology exam 08/01/21. ? ?Appears to be well controlled. Has next f/u with ophthalmology around September. Plan to continue Humira 40 mg [place] q14days and MTX 12.5 mg PO weekly and folic acid 2 mg daily. If very well controlled at next exam for this may try further tapering MTX dose. ? ?Pain in left finger(s) - Mild joint pain may be some RA activity fairly recent and not severely limiting at this time.  ? ?Symptoms improved at this time off any localized treatment or NSAIDs. No concern for inflammatory arthritis currently. ? ?High risk medication use - methotrexate 12.5 mg PO weekly and Humira 40 mg q14days. - Plan: CBC with Differential/Platelet, COMPLETE METABOLIC PANEL WITH GFR ? ?Checking CBC and CMP for methotrexate and Humira toxicity  monitoring. Methotrexate was adjust with mild RBC abnormalities previously. ? ?Orders: ?Orders Placed This Encounter  ?Procedures  ? CBC with Differential/Platelet  ? COMPLETE METABOLIC PANEL WITH GFR  ? ?No orders of the defined

## 2021-12-13 ENCOUNTER — Encounter: Payer: Self-pay | Admitting: Internal Medicine

## 2021-12-13 ENCOUNTER — Ambulatory Visit: Payer: BC Managed Care – PPO | Admitting: Internal Medicine

## 2021-12-13 VITALS — BP 133/77 | HR 80 | Resp 15 | Ht 63.0 in | Wt 186.0 lb

## 2021-12-13 DIAGNOSIS — Z79899 Other long term (current) drug therapy: Secondary | ICD-10-CM

## 2021-12-13 DIAGNOSIS — M79645 Pain in left finger(s): Secondary | ICD-10-CM | POA: Diagnosis not present

## 2021-12-13 DIAGNOSIS — H15003 Unspecified scleritis, bilateral: Secondary | ICD-10-CM

## 2021-12-14 LAB — CBC WITH DIFFERENTIAL/PLATELET
Absolute Monocytes: 1000 cells/uL — ABNORMAL HIGH (ref 200–950)
Basophils Absolute: 51 cells/uL (ref 0–200)
Basophils Relative: 0.7 %
Eosinophils Absolute: 37 cells/uL (ref 15–500)
Eosinophils Relative: 0.5 %
HCT: 39.9 % (ref 35.0–45.0)
Hemoglobin: 13.8 g/dL (ref 11.7–15.5)
Lymphs Abs: 2555 cells/uL (ref 850–3900)
MCH: 35 pg — ABNORMAL HIGH (ref 27.0–33.0)
MCHC: 34.6 g/dL (ref 32.0–36.0)
MCV: 101.3 fL — ABNORMAL HIGH (ref 80.0–100.0)
MPV: 9.5 fL (ref 7.5–12.5)
Monocytes Relative: 13.7 %
Neutro Abs: 3657 cells/uL (ref 1500–7800)
Neutrophils Relative %: 50.1 %
Platelets: 317 10*3/uL (ref 140–400)
RBC: 3.94 10*6/uL (ref 3.80–5.10)
RDW: 12.8 % (ref 11.0–15.0)
Total Lymphocyte: 35 %
WBC: 7.3 10*3/uL (ref 3.8–10.8)

## 2021-12-14 LAB — COMPLETE METABOLIC PANEL WITH GFR
AG Ratio: 1.4 (calc) (ref 1.0–2.5)
ALT: 26 U/L (ref 6–29)
AST: 24 U/L (ref 10–35)
Albumin: 4.4 g/dL (ref 3.6–5.1)
Alkaline phosphatase (APISO): 68 U/L (ref 37–153)
BUN: 11 mg/dL (ref 7–25)
CO2: 28 mmol/L (ref 20–32)
Calcium: 9.8 mg/dL (ref 8.6–10.4)
Chloride: 104 mmol/L (ref 98–110)
Creat: 0.84 mg/dL (ref 0.50–1.05)
Globulin: 3.1 g/dL (calc) (ref 1.9–3.7)
Glucose, Bld: 83 mg/dL (ref 65–99)
Potassium: 4.4 mmol/L (ref 3.5–5.3)
Sodium: 140 mmol/L (ref 135–146)
Total Bilirubin: 0.6 mg/dL (ref 0.2–1.2)
Total Protein: 7.5 g/dL (ref 6.1–8.1)
eGFR: 79 mL/min/{1.73_m2} (ref >=60)

## 2021-12-14 NOTE — Progress Notes (Signed)
Lab results look fine for continuing methotrexate and Humira. Her MCV test is slightly high like we discussed in clinic, but with normal blood count and hemoglobin so no changes needed.

## 2021-12-15 ENCOUNTER — Telehealth: Payer: Self-pay | Admitting: Pharmacist

## 2021-12-15 ENCOUNTER — Other Ambulatory Visit (HOSPITAL_COMMUNITY): Payer: Self-pay

## 2021-12-15 NOTE — Telephone Encounter (Addendum)
Received fax from [facility] stating PA for Humira is required. Submitted on CMM. Automated response states: PA was already submitted for this patient and drug which was denied.;[id number];  Appeal Information: Attention:ATTN: CLINICAL APPEALS DEPARTMENT [employer] Phone:[phone];  Key: [id number]  Unclear who renewed authorization because neither [facility] nor I did so.  Per previous note from [facility] on 06/14/21, authorization is already active through 06/14/22. Attempted to run test claim to confirm. Test claim shows rejection that patient is locked into another pharmacy ([facility]) but does not state that prior Josem Kaufmann is required.  Knox Saliva, PharmD, MPH, BCPS, CPP Clinical Pharmacist (Rheumatology and Pulmonology)

## 2022-01-04 ENCOUNTER — Other Ambulatory Visit: Payer: Self-pay | Admitting: Internal Medicine

## 2022-01-04 NOTE — Telephone Encounter (Signed)
Next Visit: 03/15/2022  Last Visit: 12/13/2021  Last Fill: 07/06/2021  DX: Bilateral scleritis   Current Dose per office note 5/79/0383: folic acid 2 mg daily  Okay to refill Folic Acid?

## 2022-01-10 DIAGNOSIS — Z6832 Body mass index (BMI) 32.0-32.9, adult: Secondary | ICD-10-CM | POA: Diagnosis not present

## 2022-01-10 DIAGNOSIS — R635 Abnormal weight gain: Secondary | ICD-10-CM | POA: Diagnosis not present

## 2022-01-10 DIAGNOSIS — Z01419 Encounter for gynecological examination (general) (routine) without abnormal findings: Secondary | ICD-10-CM | POA: Diagnosis not present

## 2022-01-10 DIAGNOSIS — Z1231 Encounter for screening mammogram for malignant neoplasm of breast: Secondary | ICD-10-CM | POA: Diagnosis not present

## 2022-01-22 DIAGNOSIS — R399 Unspecified symptoms and signs involving the genitourinary system: Secondary | ICD-10-CM | POA: Diagnosis not present

## 2022-01-27 ENCOUNTER — Other Ambulatory Visit: Payer: Self-pay | Admitting: Internal Medicine

## 2022-01-27 DIAGNOSIS — H15003 Unspecified scleritis, bilateral: Secondary | ICD-10-CM

## 2022-01-27 DIAGNOSIS — H2013 Chronic iridocyclitis, bilateral: Secondary | ICD-10-CM

## 2022-02-27 DIAGNOSIS — R194 Change in bowel habit: Secondary | ICD-10-CM | POA: Diagnosis not present

## 2022-02-27 DIAGNOSIS — Z1211 Encounter for screening for malignant neoplasm of colon: Secondary | ICD-10-CM | POA: Diagnosis not present

## 2022-02-27 DIAGNOSIS — Z800 Family history of malignant neoplasm of digestive organs: Secondary | ICD-10-CM | POA: Diagnosis not present

## 2022-02-27 DIAGNOSIS — R152 Fecal urgency: Secondary | ICD-10-CM | POA: Diagnosis not present

## 2022-03-01 NOTE — Progress Notes (Signed)
Office Visit Note  Patient: Kari Williams             Date of Birth: 1960-08-28           MRN: [id number]             PCP: Seward Carol, MD Referring: Seward Carol, MD Visit Date: 03/15/2022   Subjective:  Follow-up (Feeling ok. Right knee pain.)   History of Present Illness: Kari Williams is a 61 y.o. 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 She had a UTI in June treated with nitrofurantoin then switched to cefuroxime based on susceptibilities and symptoms resolved.  Overall she is doing well no symptomatic complaints with eye irritation or vision changes.  She has some right knee pain and stiffness does not severely limit her mobility.  Symptoms are most noticeable with certain movements such as descending stairs.   Previous HPI 12/13/2021 Kari Williams is a 61 y.o. female here for follow up for uveiitis and scleritis on methotrexate 12.5 mg PO weekly and Humira [place] q14days. Since our last visit her finger pain is improved not needing any additional NSAIDs for symptom management. She has floaters but no new visual complaint no eye redness or pain.   Previous HPI 09/13/2021 Kari Williams is a 61 y.o. female here for follow up for uveiitis and scleritis on methotrexate 15 mg PO weekly and Humira [place] q14days. Last visit with Dr. Manuella Ghazi 08/01/21. Eyes are doing okay without new complaints and exam was okay. She has noticed some increased pain and swelling affecting left hand fingers also mildly tender nodule swelling behind her left ear.   Previous HPI 06/14/21 Kari Williams is a 61 y.o. female here for follow up for uveitis and scleritis on methotrexate 15 mg PO weekly and Humira 40 mg [place] q14days. She has experienced small painless skin rashes in a few places including the left elbow these improved with conservative treatment moisturizing. She has left calf cramps at night ongoing no problems during the day or with mobility. Bilateral eye dryness is  slightly worse than before during day time, using lubricating eye drops more than before. She has future appointment with Dr. Manuella Ghazi scheduled for next week.   Previous HPI 06/07/20 Kari Williams is a 61 y.o. 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 She has a previous history of treatment with methotrexate for iritis in 2010-2015 with Dr. Herbert Deaner and Dr. Charlestine Night. She had been off any treatment but visit with Dr. 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  Since earlier this year she has experienced eye redness watering and blurry vision.  She is more symptomatic complaint on the right than on the left currently but both sides bother her.  Besides the eye complaints she does also report some fairly diffuse arthralgias and morning stiffness up to 30 minutes.  She has not noticed any specific joint swelling warmth or erythema.  In the past she has also been treated with oral or ocular steroids but has not received any for the current relapse of symptoms.     Review of Systems  Constitutional:  Positive for fatigue.  HENT:  Negative for mouth sores and mouth dryness.   Eyes:  Positive for dryness.  Respiratory:  Negative for shortness of breath.   Cardiovascular:  Negative for chest pain and palpitations.  Gastrointestinal:  Negative for blood in stool, constipation and diarrhea.  Endocrine: Negative  for increased urination.  Genitourinary:  Negative for involuntary urination.  Musculoskeletal:  Positive for joint pain, joint pain, joint swelling and morning stiffness. Negative for gait problem, myalgias, muscle weakness, muscle tenderness and myalgias.  Skin:  Negative for color change, rash, hair loss and sensitivity to sunlight.  Allergic/Immunologic: Negative for susceptible to infections.  Neurological:  Negative for dizziness and headaches.  Hematological:  Negative for swollen glands.  Psychiatric/Behavioral:   Positive for sleep disturbance. Negative for depressed mood. The patient is nervous/anxious.     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   High risk medication use 06/07/2020   Carpal tunnel syndrome 11/29/2015   Hypertensive retinopathy of both eyes 11/01/2015   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   Nuclear sclerotic cataract of both eyes 10/13/2015   Epiretinal membrane (ERM) of right eye 10/13/2015   Lumbosacral radiculitis 09/01/2014   Routine gynecological examination 11/19/2011   Abdominal or pelvic swelling, mass, or lump, generalized 11/19/2011   Overweight (BMI 25.0-29.9) 11/19/2011   Fibroids 11/19/2011   HTN (hypertension) 11/19/2011   Condyloma 11/19/2011    Past Medical History:  Diagnosis Date   Allergy    seasonal   Arthritis    osteoarthritis/ddd   Hypertension     Family History  Problem Relation Age of Onset   Colon cancer Brother    Dementia Mother    Hypotension Mother    Stroke Father    Breast cancer Sister    Hypertension Sister    Thyroid disease Sister    Lupus Paternal Aunt    Sleep apnea Brother    Stroke Brother    Lupus Niece    Past Surgical History:  Procedure Laterality Date   ABDOMINAL HYSTERECTOMY     CARPAL TUNNEL RELEASE  07/30/2000   right   COLONOSCOPY     LUMBAR [place] SURGERY  07/30/2004   LUMBAR FUSION  07/30/2009   THORACIC [place] SURGERY  07/30/2000   TUBAL LIGATION  07/02/2012   Procedure: BILATERAL TUBAL LIGATION;  Surgeon: Ena Dawley, MD;  Location: [facility];  Service: Gynecology;  Laterality: Bilateral;   UPPER GASTROINTESTINAL ENDOSCOPY     VAGINAL HYSTERECTOMY  07/02/2012   Procedure: HYSTERECTOMY VAGINAL;  Surgeon: Ena Dawley, MD;  Location: [facility];  Service:  Gynecology;  Laterality: N/A;   Social History   Social History Narrative   Not on file   Immunization History  Administered Date(s) Administered   Influenza Split 07/03/2012   PFIZER(Purple Top)SARS-COV-2 Vaccination 10/13/2019, 11/03/2019     Objective: Vital Signs: BP 121/75 (BP Location: Left Arm, Patient Position: Sitting, Cuff Size: Large)   Pulse 76   Resp 15   Ht '5\' 3"'$  (1.6 m)   Wt 184 lb 3.2 oz (83.6 kg)   BMI 32.63 kg/m    Physical Exam Constitutional:      Appearance: She is obese.  Cardiovascular:     Rate and Rhythm: Normal rate and regular rhythm.  Pulmonary:     Effort: Pulmonary effort is normal.     Breath sounds: Normal breath sounds.  Skin:    General: Skin is warm and dry.     Findings: No rash.  Neurological:     Mental Status: She is alert.  Psychiatric:        Mood and Affect: Mood normal.      Musculoskeletal Exam:  Shoulders full ROM no tenderness or swelling Elbows full ROM no tenderness or swelling Wrists full ROM no tenderness or swelling Fingers full ROM no tenderness or swelling Knees full ROM no tenderness or swelling, mild patellofemoral crepitus present, very mild pain with patellar grind on right knee Ankles full ROM no tenderness or swelling   CDAI Exam: CDAI Score: 1  Patient Global: 10 mm; Provider Global: 0 mm Swollen: 0 ; Tender: 0  Joint Exam 03/15/2022   All documented joints were normal     Investigation: No additional findings.  Imaging: No results found.  Recent Labs: Lab Results  Component Value Date   WBC 6.5 03/15/2022   HGB 13.6 03/15/2022   PLT 303 03/15/2022   NA 139 03/15/2022   K 4.4 03/15/2022   CL 105 03/15/2022   CO2 27 03/15/2022   GLUCOSE 74 03/15/2022   BUN 13 03/15/2022   CREATININE 0.84 03/15/2022   BILITOT 0.9 03/15/2022   ALKPHOS 55 01/22/2010   AST 28 03/15/2022   ALT 28 03/15/2022   PROT 7.8 03/15/2022   ALBUMIN 2.7 (L) 01/22/2010   CALCIUM 10.0 03/15/2022   GFRAA 77  12/07/2020   QFTBGOLDPLUS NEGATIVE 06/14/2021    Speciality Comments: No specialty comments available.  Procedures:  No procedures performed Allergies: Penicillins   Assessment / Plan:     Visit Diagnoses: Bilateral scleritis - Plan: Sedimentation rate  Inflammation appears well controlled with quiescent disease on most recent eye exam.  No obvious joint inflammatory changes at this time.  Plan to continue the methotrexate 12.5 mg p.o. weekly and Humira 40 mg q. 14 days and folic acid 1 mg daily.  High risk medication use -  Methotrexate 12.5 mg PO  5 TABLETS ONCE A WEEK weekly and Humira 40 mg q14days - Plan: CBC with Differential/Platelet, COMPLETE METABOLIC PANEL WITH GFR  Checking CBC and CMP for methotrexate and Humira toxicity monitoring.  She was treated for 1 UTI since her last visit she has not had a previous one requiring antibiotics for about 3 years or longer.  We will continue monitoring if she has an increased frequency may be medication associated.  Pain in left finger(s)  No new flareup with hand pain or swelling since her last visit.  Acute pain of right knee - Plan: Sedimentation rate  Right knee pain appears most consistent with some patellofemoral pain syndrome possibly a patellar tendinitis or early patellofemoral compartment osteoarthritis.  I do not think this indicates evidence of inflammatory arthritis at this time provided information review on conservative treatments.  Orders: Orders Placed This Encounter  Procedures   Sedimentation rate   CBC with Differential/Platelet   COMPLETE METABOLIC PANEL WITH GFR   No orders of the defined types were placed in this encounter.    Follow-Up Instructions: Return in about 3 months (around 06/15/2022) for Scleritis on MTX/ADA f/u 66mo.   CCollier Salina MD  Note - This record has been created using [employer]  Chart creation errors have been sought, but may not always  have been located. Such  creation errors do not reflect on  the standard of medical care.

## 2022-03-15 ENCOUNTER — Ambulatory Visit: Payer: BC Managed Care – PPO | Attending: Internal Medicine | Admitting: Internal Medicine

## 2022-03-15 ENCOUNTER — Encounter: Payer: Self-pay | Admitting: Internal Medicine

## 2022-03-15 VITALS — BP 121/75 | HR 76 | Resp 15 | Ht 63.0 in | Wt 184.2 lb

## 2022-03-15 DIAGNOSIS — M79645 Pain in left finger(s): Secondary | ICD-10-CM | POA: Diagnosis not present

## 2022-03-15 DIAGNOSIS — H15003 Unspecified scleritis, bilateral: Secondary | ICD-10-CM | POA: Diagnosis not present

## 2022-03-15 DIAGNOSIS — Z79899 Other long term (current) drug therapy: Secondary | ICD-10-CM | POA: Diagnosis not present

## 2022-03-15 DIAGNOSIS — M25561 Pain in right knee: Secondary | ICD-10-CM | POA: Diagnosis not present

## 2022-03-16 LAB — COMPLETE METABOLIC PANEL WITH GFR
AG Ratio: 1.6 (calc) (ref 1.0–2.5)
ALT: 28 U/L (ref 6–29)
AST: 28 U/L (ref 10–35)
Albumin: 4.8 g/dL (ref 3.6–5.1)
Alkaline phosphatase (APISO): 68 U/L (ref 37–153)
BUN: 13 mg/dL (ref 7–25)
CO2: 27 mmol/L (ref 20–32)
Calcium: 10 mg/dL (ref 8.6–10.4)
Chloride: 105 mmol/L (ref 98–110)
Creat: 0.84 mg/dL (ref 0.50–1.05)
Globulin: 3 g/dL (calc) (ref 1.9–3.7)
Glucose, Bld: 74 mg/dL (ref 65–99)
Potassium: 4.4 mmol/L (ref 3.5–5.3)
Sodium: 139 mmol/L (ref 135–146)
Total Bilirubin: 0.9 mg/dL (ref 0.2–1.2)
Total Protein: 7.8 g/dL (ref 6.1–8.1)
eGFR: 78 mL/min/{1.73_m2} (ref >=60)

## 2022-03-16 LAB — CBC WITH DIFFERENTIAL/PLATELET
Absolute Monocytes: 774 cells/uL (ref 200–950)
Basophils Absolute: 59 cells/uL (ref 0–200)
Basophils Relative: 0.9 %
Eosinophils Absolute: 78 cells/uL (ref 15–500)
Eosinophils Relative: 1.2 %
HCT: 40.8 % (ref 35.0–45.0)
Hemoglobin: 13.6 g/dL (ref 11.7–15.5)
Lymphs Abs: 2126 cells/uL (ref 850–3900)
MCH: 33.4 pg — ABNORMAL HIGH (ref 27.0–33.0)
MCHC: 33.3 g/dL (ref 32.0–36.0)
MCV: 100.2 fL — ABNORMAL HIGH (ref 80.0–100.0)
MPV: 9.5 fL (ref 7.5–12.5)
Monocytes Relative: 11.9 %
Neutro Abs: 3465 cells/uL (ref 1500–7800)
Neutrophils Relative %: 53.3 %
Platelets: 303 10*3/uL (ref 140–400)
RBC: 4.07 10*6/uL (ref 3.80–5.10)
RDW: 13 % (ref 11.0–15.0)
Total Lymphocyte: 32.7 %
WBC: 6.5 10*3/uL (ref 3.8–10.8)

## 2022-03-16 LAB — SEDIMENTATION RATE: Sed Rate: 14 mm/h (ref 0–30)

## 2022-03-16 NOTE — Progress Notes (Signed)
Lab results look good for continuing current methotrexate and Humira with no changes.

## 2022-05-04 DIAGNOSIS — Z800 Family history of malignant neoplasm of digestive organs: Secondary | ICD-10-CM | POA: Diagnosis not present

## 2022-05-04 DIAGNOSIS — K5730 Diverticulosis of large intestine without perforation or abscess without bleeding: Secondary | ICD-10-CM | POA: Diagnosis not present

## 2022-05-04 DIAGNOSIS — K635 Polyp of colon: Secondary | ICD-10-CM | POA: Diagnosis not present

## 2022-05-04 DIAGNOSIS — D122 Benign neoplasm of ascending colon: Secondary | ICD-10-CM | POA: Diagnosis not present

## 2022-05-04 DIAGNOSIS — Z1211 Encounter for screening for malignant neoplasm of colon: Secondary | ICD-10-CM | POA: Diagnosis not present

## 2022-05-23 ENCOUNTER — Other Ambulatory Visit: Payer: Self-pay | Admitting: Internal Medicine

## 2022-05-23 NOTE — Telephone Encounter (Signed)
Next Visit: 06/19/2022  Last Visit: 03/15/2022  Last Fill: 01/04/2022  Dx: Bilateral scleritis  Current Dose per office note on 3/00/5110: folic acid 1 mg daily  Okay to refill folic acid?

## 2022-05-30 ENCOUNTER — Ambulatory Visit (INDEPENDENT_AMBULATORY_CARE_PROVIDER_SITE_OTHER): Payer: BC Managed Care – PPO | Admitting: Internal Medicine

## 2022-05-30 ENCOUNTER — Encounter (INDEPENDENT_AMBULATORY_CARE_PROVIDER_SITE_OTHER): Payer: Self-pay | Admitting: Internal Medicine

## 2022-05-30 VITALS — BP 133/83 | HR 83 | Temp 98.2°F | Ht 63.0 in | Wt 180.2 lb

## 2022-05-30 DIAGNOSIS — I10 Essential (primary) hypertension: Secondary | ICD-10-CM

## 2022-05-30 DIAGNOSIS — G4700 Insomnia, unspecified: Secondary | ICD-10-CM | POA: Diagnosis not present

## 2022-05-30 DIAGNOSIS — E669 Obesity, unspecified: Secondary | ICD-10-CM

## 2022-05-30 DIAGNOSIS — Z0289 Encounter for other administrative examinations: Secondary | ICD-10-CM

## 2022-05-30 DIAGNOSIS — Z6831 Body mass index (BMI) 31.0-31.9, adult: Secondary | ICD-10-CM

## 2022-05-30 NOTE — Progress Notes (Signed)
Office: [phone]  /  Fax: [phone]   Initial Visit  Kari Williams was seen in clinic today to evaluate for obesity. She is interested in losing weight to improve overall health and reduce the risk of weight related complications. She presents today to review program treatment options, initial physical assessment, and evaluation.  She has been trying to lose weight doing [place] but has noticed that she has plateaued and staying within the 175-183 range.  She is trying to be proactive and avoid weight related complications.  She does report disrupted sleep and only sleeping about 5 to 6 hours a night.  She was referred by: PCP Dr. Delfina Redwood  When asked what else they would like to accomplish? She states: Adopt healthier eating patterns, Improve existing medical conditions, and Reduce number of medications  When asked how has your weight affected you? She states: Contributed to medical problems  Some associated conditions: Hypertension  Contributing factors: Family history, Disruption of circadian rhythm, Stress, Life event, and Menopause  Weight promoting medications identified: Psychotropic medications  Current nutrition plan: Other: Is following no processed carb diet  Current level of physical activity: Walking  Current or previous pharmacotherapy: None  Response to medication: Never tried medications   Past medical history includes:   Past Medical History:  Diagnosis Date   Allergy    seasonal   Arthritis    osteoarthritis/ddd   Hypertension      Objective:   BP 133/83   Pulse 83   Temp 98.2 F (36.8 C)   Ht '5\' 3"'$  (1.6 m)   Wt 180 lb 3.2 oz (81.7 kg)   SpO2 96%   BMI 31.92 kg/m  She was weighed on the bioimpedance scale: Body mass index is 31.92 kg/m.  Peak Weight: 225,Visceral Fat Rating: 10, Body Fat%: 35.8, Weight trend over the last 12 months: Decreasing  General:  Alert, oriented and cooperative. Patient is in no acute distress.   Respiratory: Normal respiratory effort, no problems with respiration noted  Extremities: Normal range of motion.    Mental Status: Normal mood and affect. Normal behavior. Normal judgment and thought content.   Assessment and Plan:  1. Class 1 obesity without serious comorbidity with body mass index (BMI) of 31.0 to 31.9 in adult, unspecified obesity type  We reviewed weight, biometrics, associated medical conditions and contributing factors with patient.  Her visceral fat rating is 10 and her body fat percentage is 35.8 which are not that far from goal.  She has a significant amount of skeletal muscle which may be contributing to her elevated BMI.  She would benefit from weight loss therapy via a modified calorie, low-carb, high-protein nutritional plan tailored to her REE (resting energy expenditure) which will be determined by indirect calorimetry.  We will also assess for cardiometabolic risk and nutritional derangements via fasting serologies at her next appointment.   2. Hypertension, unspecified type Blood pressure is close to goal of less than 130/80.  She is currently on amlodipine and losartan without any side effects.  She will continue medication.  We will check renal parameters with intake labs if not done recently.  Her blood pressure may improve with 5 to 10% total weight loss.  3.  Insomnia Likely psychophysiological.  This may contribute to weight gain.  He has tried sleep aids in the past which have been ineffective or caused medication side effects.  I talked to her briefly about cognitive behavioral therapy for insomnia and will try self-guided  programs online.       Obesity Treatment / Action Plan:  Patient will work on garnering support from family and friends to begin weight loss journey. Will work on eliminating or reducing the presence of highly palatable, calorie dense foods in the home. Will complete provided nutritional and psychosocial assessment questionnaire  before the next appointment. Will be scheduled for indirect calorimetry to determine resting energy expenditure in a fasting state.  This will allow [place] to create a reduced calorie, high-protein meal plan to promote loss of fat mass while preserving muscle mass. Will work on improving sleep hygiene and trying to obtain at least 7 hours of sleep. Was counseled on nutritional approaches to weight loss and benefits of complex carbs and high quality protein as part of nutritional weight management.  Obesity Education Performed Today:  She was weighed on the bioimpedance scale and results were discussed and documented in the synopsis.  We discussed obesity as a disease and the importance of a more detailed evaluation of all the factors contributing to the disease.  We discussed the importance of long term lifestyle changes which include nutrition, exercise and behavioral modifications as well as the importance of customizing this to her specific health and social needs.  We discussed the benefits of reaching a healthier weight to alleviate the symptoms of existing conditions and reduce the risks of the biomechanical, metabolic and psychological effects of obesity.  [name] appears to be in the action stage of change and states they are ready to start intensive lifestyle modifications and behavioral modifications.  30 minutes was spent today on this visit including the above counseling, pre-visit chart review, and post-visit documentation.  Reviewed by clinician on day of visit: allergies, medications, problem list, medical history, surgical history, family history, social history, and previous encounter notes.     I have reviewed the above documentation for accuracy and completeness, and I agree with the above.  Thomes Dinning, MD

## 2022-05-31 ENCOUNTER — Other Ambulatory Visit (HOSPITAL_COMMUNITY): Payer: Self-pay

## 2022-06-01 ENCOUNTER — Telehealth: Payer: Self-pay | Admitting: Pharmacist

## 2022-06-01 NOTE — Telephone Encounter (Signed)
Received notification from [employer] [facility] [employer] portal, patient's Humira is set to expire on 06/14/22. Prior Authorization request approved from 05/01/22 trough 06/01/2023.  Case # [id number]  Knox Saliva, PharmD, MPH, BCPS, CPP Clinical Pharmacist (Rheumatology and Pulmonology)

## 2022-06-05 DIAGNOSIS — H35371 Puckering of macula, right eye: Secondary | ICD-10-CM | POA: Diagnosis not present

## 2022-06-05 DIAGNOSIS — H15002 Unspecified scleritis, left eye: Secondary | ICD-10-CM | POA: Diagnosis not present

## 2022-06-05 DIAGNOSIS — H2513 Age-related nuclear cataract, bilateral: Secondary | ICD-10-CM | POA: Diagnosis not present

## 2022-06-05 DIAGNOSIS — H35033 Hypertensive retinopathy, bilateral: Secondary | ICD-10-CM | POA: Diagnosis not present

## 2022-06-19 ENCOUNTER — Ambulatory Visit: Payer: BC Managed Care – PPO | Attending: Internal Medicine | Admitting: Internal Medicine

## 2022-06-19 ENCOUNTER — Encounter: Payer: Self-pay | Admitting: Internal Medicine

## 2022-06-19 VITALS — BP 127/75 | HR 92 | Resp 15 | Ht 63.0 in | Wt 183.0 lb

## 2022-06-19 DIAGNOSIS — M25561 Pain in right knee: Secondary | ICD-10-CM | POA: Diagnosis not present

## 2022-06-19 DIAGNOSIS — H2013 Chronic iridocyclitis, bilateral: Secondary | ICD-10-CM

## 2022-06-19 DIAGNOSIS — Z79899 Other long term (current) drug therapy: Secondary | ICD-10-CM

## 2022-06-19 DIAGNOSIS — G43909 Migraine, unspecified, not intractable, without status migrainosus: Secondary | ICD-10-CM | POA: Insufficient documentation

## 2022-06-19 DIAGNOSIS — G43809 Other migraine, not intractable, without status migrainosus: Secondary | ICD-10-CM

## 2022-06-19 NOTE — Progress Notes (Signed)
Office Visit Note  Patient: Kari Williams             Date of Birth: 03-07-1961           MRN: [id number]             PCP: Seward Carol, MD Referring: Seward Carol, MD Visit Date: 06/19/2022   Subjective:  Follow-up (Doing good)   History of Present Illness: Kari Williams is a 61 y.o. female here for follow up for uveitis and scleritis on Humir 40 mg [place] q14days and MTX 12.5 mg PO weekly and folic acid 2 mg daily. After last visit right knee pain improved and doing well now. She had follow up with Dr. Manuella Ghazi eye exam negative for active uveitis although had some dryness and surface irritation. Getting restasis approved for this. She also has migraines which were a problem I the past but had been free of these for years until recently.   Previous HPI 03/15/22 Kari Williams is a 61 y.o. 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 She had a UTI in June treated with nitrofurantoin then switched to cefuroxime based on susceptibilities and symptoms resolved.  Overall she is doing well no symptomatic complaints with eye irritation or vision changes.  She has some right knee pain and stiffness does not severely limit her mobility.  Symptoms are most noticeable with certain movements such as descending stairs.     Previous HPI 12/13/2021 Kari Williams is a 61 y.o. female here for follow up for uveiitis and scleritis on methotrexate 12.5 mg PO weekly and Humira [place] q14days. Since our last visit her finger pain is improved not needing any additional NSAIDs for symptom management. She has floaters but no new visual complaint no eye redness or pain.   Previous HPI 09/13/2021 Kari Williams is a 61 y.o. female here for follow up for uveiitis and scleritis on methotrexate 15 mg PO weekly and Humira [place] q14days. Last visit with Dr. Manuella Ghazi 08/01/21. Eyes are doing okay without new complaints and exam was okay. She has noticed some increased pain and swelling affecting left  hand fingers also mildly tender nodule swelling behind her left ear.   Previous HPI 06/14/21 Kari Williams is a 61 y.o. female here for follow up for uveitis and scleritis on methotrexate 15 mg PO weekly and Humira 40 mg [place] q14days. She has experienced small painless skin rashes in a few places including the left elbow these improved with conservative treatment moisturizing. She has left calf cramps at night ongoing no problems during the day or with mobility. Bilateral eye dryness is slightly worse than before during day time, using lubricating eye drops more than before. She has future appointment with Dr. Manuella Ghazi scheduled for next week.   Previous HPI 06/07/20 Kari Williams is a 61 y.o. 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 She has a previous history of treatment with methotrexate for iritis in 2010-2015 with Dr. Herbert Deaner and Dr. Charlestine Night. She had been off any treatment but visit with Dr. 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  Since earlier this year she has experienced eye redness watering and blurry vision.  She is more symptomatic complaint on the right than on the left currently but both sides bother her.  Besides the eye complaints she does also report some fairly diffuse arthralgias and morning stiffness up to 30 minutes.  She has not  noticed any specific joint swelling warmth or erythema.  In the past she has also been treated with oral or ocular steroids but has not received any for the current relapse of symptoms.      Review of Systems  Constitutional:  Positive for fatigue.  HENT:  Negative for mouth sores and mouth dryness.   Eyes:  Positive for dryness.  Respiratory:  Negative for shortness of breath.   Cardiovascular:  Negative for chest pain and palpitations.  Gastrointestinal:  Negative for blood in stool, constipation and diarrhea.  Endocrine: Negative for increased urination.   Genitourinary:  Negative for involuntary urination.  Musculoskeletal:  Positive for morning stiffness. Negative for joint pain, gait problem, joint pain, joint swelling, myalgias, muscle weakness, muscle tenderness and myalgias.  Skin:  Positive for hair loss. Negative for color change, rash and sensitivity to sunlight.  Allergic/Immunologic: Negative for susceptible to infections.  Neurological:  Positive for headaches. Negative for dizziness.  Hematological:  Negative for swollen glands.  Psychiatric/Behavioral:  Positive for depressed mood and sleep disturbance. The patient is nervous/anxious.     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   High risk medication use 06/07/2020   Carpal tunnel syndrome 11/29/2015   Hypertensive retinopathy of both eyes 11/01/2015   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   Nuclear sclerotic cataract of both eyes 10/13/2015   Epiretinal membrane (ERM) of right eye 10/13/2015   Lumbosacral radiculitis 09/01/2014   Routine gynecological examination 11/19/2011   Abdominal or pelvic swelling, mass, or lump, generalized 11/19/2011   Overweight (BMI 25.0-29.9) 11/19/2011   Fibroids 11/19/2011   HTN (hypertension) 11/19/2011   Condyloma 11/19/2011    Past Medical History:  Diagnosis Date   Allergy    seasonal   Arthritis    osteoarthritis/ddd   Hypertension     Family History  Problem Relation Age of Onset   Colon cancer Brother    Dementia Mother    Hypotension Mother    Stroke Father    Breast cancer Sister    Hypertension Sister    Thyroid disease Sister    Lupus Paternal Aunt    Sleep apnea Brother    Stroke Brother    Lupus Niece    Past Surgical History:  Procedure  Laterality Date   ABDOMINAL HYSTERECTOMY     CARPAL TUNNEL RELEASE  07/30/2000   right   COLONOSCOPY     LUMBAR [place] SURGERY  07/30/2004   LUMBAR FUSION  07/30/2009   THORACIC [place] SURGERY  07/30/2000   TUBAL LIGATION  07/02/2012   Procedure: BILATERAL TUBAL LIGATION;  Surgeon: Ena Dawley, MD;  Location: [facility];  Service: Gynecology;  Laterality: Bilateral;   UPPER GASTROINTESTINAL ENDOSCOPY     VAGINAL HYSTERECTOMY  07/02/2012   Procedure: HYSTERECTOMY VAGINAL;  Surgeon: Ena Dawley, MD;  Location: [facility];  Service: Gynecology;  Laterality: N/A;   Social History   Social History Narrative   Not on file   Immunization History  Administered Date(s) Administered   Influenza Split 07/03/2012   PFIZER(Purple Top)SARS-COV-2 Vaccination 10/13/2019, 11/03/2019     Objective: Vital Signs: BP 127/75 (BP Location: Right Arm, Patient Position: Sitting, Cuff Size: Normal)   Pulse 92   Resp  15   Ht '5\' 3"'$  (1.6 m)   Wt 183 lb (83 kg)   BMI 32.42 kg/m    Physical Exam Cardiovascular:     Rate and Rhythm: Normal rate and regular rhythm.  Pulmonary:     Effort: Pulmonary effort is normal.     Breath sounds: Normal breath sounds.  Musculoskeletal:     Right lower leg: No edema.     Left lower leg: No edema.  Skin:    General: Skin is warm and dry.     Findings: No rash.     Comments: Mild longitudinal finger nail ridges, no pitting or lesions  Neurological:     Mental Status: She is alert.  Psychiatric:        Mood and Affect: Mood normal.      Musculoskeletal Exam:  Shoulders full ROM no tenderness or swelling Elbows full ROM no tenderness or swelling Wrists full ROM no tenderness or swelling Fingers full ROM no tenderness or swelling Knees full ROM no tenderness or swelling   Investigation: No additional findings.  Imaging: No results found.  Recent Labs: Lab Results  Component Value Date   WBC 6.7 06/19/2022   HGB 13.5 06/19/2022   PLT 323 06/19/2022    NA 144 06/19/2022   K 4.4 06/19/2022   CL 106 06/19/2022   CO2 33 (H) 06/19/2022   GLUCOSE 88 06/19/2022   BUN 13 06/19/2022   CREATININE 1.14 (H) 06/19/2022   BILITOT 0.4 06/19/2022   ALKPHOS 55 01/22/2010   AST 24 06/19/2022   ALT 25 06/19/2022   PROT 7.5 06/19/2022   ALBUMIN 2.7 (L) 01/22/2010   CALCIUM 9.9 06/19/2022   GFRAA 77 12/07/2020   QFTBGOLDPLUS NEGATIVE 06/14/2021    Speciality Comments: No specialty comments available.  Procedures:  No procedures performed Allergies: Penicillins   Assessment / Plan:     Visit Diagnoses: Bilateral chronic anterior uveitis - Plan: Sedimentation rate  Uveitis appears well-controlled by most recent ophthalmology exam.  Checking sedimentation rate as well for disease activity monitoring.  Does have some symptomatic eye dryness by complaint and noticed on exam.  Could be sicca syndrome secondary to rheumatoid disease or unrelated process but is starting Restasis drops in addition to regular lubricating Systane eyedrops.  Plan to continue Humira 40 mg subcu q. 14 days methotrexate 12.5 mg p.o. weekly folic acid 2 mg daily.  High risk medication use - Plan: CBC with Differential/Platelet, COMPLETE METABOLIC PANEL WITH GFR, QuantiFERON-TB Gold Plus  Checking CBC and CMP for ongoing Humira and methotrexate toxicity monitoring.  Rechecking QuantiFERON for TB screening with Humira.  Right knee pain, unspecified chronicity  Knee pain not in any particular exacerbation seems to be doing slightly better today as compared to last visit.  No tenderness or effusion on exam today.  Other migraine without status migrainosus, not intractable  Ocular migraine symptoms and headaches which have returned previously was doing well although these have been present in the past.  I suspect the significant eye dryness is contributory as a possible trigger for headaches.  With most recent exam negative for active uveitis I do not think this is coming from  systemic inflammatory involvement.  Orders: Orders Placed This Encounter  Procedures   Sedimentation rate   CBC with Differential/Platelet   COMPLETE METABOLIC PANEL WITH GFR   QuantiFERON-TB Gold Plus   Meds ordered this encounter  Medications   folic acid (FOLVITE) 1 MG tablet    Sig: Take 2 tablets (2  mg total) by mouth daily.    Dispense:  180 tablet    Refill:  2     Follow-Up Instructions: Return in about 3 months (around 09/19/2022) for Scleritis on ADA/MTX f/u 65mo.   CCollier Salina MD  Note - This record has been created using [employer]  Chart creation errors have been sought, but may not always  have been located. Such creation errors do not reflect on  the standard of medical care.

## 2022-06-22 MED ORDER — FOLIC ACID 1 MG PO TABS: 2.0000 mg | ORAL_TABLET | Freq: Every day | ORAL | 2 refills | Status: DC

## 2022-06-24 LAB — COMPLETE METABOLIC PANEL WITH GFR
AG Ratio: 1.5 (calc) (ref 1.0–2.5)
ALT: 25 U/L (ref 6–29)
AST: 24 U/L (ref 10–35)
Albumin: 4.5 g/dL (ref 3.6–5.1)
Alkaline phosphatase (APISO): 73 U/L (ref 37–153)
BUN/Creatinine Ratio: 11 (calc) (ref 6–22)
BUN: 13 mg/dL (ref 7–25)
CO2: 33 mmol/L — ABNORMAL HIGH (ref 20–32)
Calcium: 9.9 mg/dL (ref 8.6–10.4)
Chloride: 106 mmol/L (ref 98–110)
Creat: 1.14 mg/dL — ABNORMAL HIGH (ref 0.50–1.05)
Globulin: 3 g/dL (calc) (ref 1.9–3.7)
Glucose, Bld: 88 mg/dL (ref 65–99)
Potassium: 4.4 mmol/L (ref 3.5–5.3)
Sodium: 144 mmol/L (ref 135–146)
Total Bilirubin: 0.4 mg/dL (ref 0.2–1.2)
Total Protein: 7.5 g/dL (ref 6.1–8.1)
eGFR: 54 mL/min/{1.73_m2} — ABNORMAL LOW (ref >=60)

## 2022-06-24 LAB — CBC WITH DIFFERENTIAL/PLATELET
Absolute Monocytes: 683 cells/uL (ref 200–950)
Basophils Absolute: 74 cells/uL (ref 0–200)
Basophils Relative: 1.1 %
Eosinophils Absolute: 74 cells/uL (ref 15–500)
Eosinophils Relative: 1.1 %
HCT: 38.5 % (ref 35.0–45.0)
Hemoglobin: 13.5 g/dL (ref 11.7–15.5)
Lymphs Abs: 1809 cells/uL (ref 850–3900)
MCH: 34.3 pg — ABNORMAL HIGH (ref 27.0–33.0)
MCHC: 35.1 g/dL (ref 32.0–36.0)
MCV: 97.7 fL (ref 80.0–100.0)
MPV: 9.9 fL (ref 7.5–12.5)
Monocytes Relative: 10.2 %
Neutro Abs: 4060 cells/uL (ref 1500–7800)
Neutrophils Relative %: 60.6 %
Platelets: 323 10*3/uL (ref 140–400)
RBC: 3.94 10*6/uL (ref 3.80–5.10)
RDW: 12.7 % (ref 11.0–15.0)
Total Lymphocyte: 27 %
WBC: 6.7 10*3/uL (ref 3.8–10.8)

## 2022-06-24 LAB — SEDIMENTATION RATE: Sed Rate: 17 mm/h (ref 0–30)

## 2022-06-24 LAB — QUANTIFERON-TB GOLD PLUS
Mitogen-NIL: >10 IU/mL
NIL: 0.03 IU/mL
QuantiFERON-TB Gold Plus: NEGATIVE
TB1-NIL: 0 IU/mL
TB2-NIL: <0 IU/mL

## 2022-06-27 ENCOUNTER — Ambulatory Visit (INDEPENDENT_AMBULATORY_CARE_PROVIDER_SITE_OTHER): Payer: BC Managed Care – PPO | Admitting: Internal Medicine

## 2022-06-27 ENCOUNTER — Encounter (INDEPENDENT_AMBULATORY_CARE_PROVIDER_SITE_OTHER): Payer: Self-pay | Admitting: Internal Medicine

## 2022-06-27 VITALS — BP 130/84 | HR 76 | Temp 98.1°F | Ht 63.0 in | Wt 180.0 lb

## 2022-06-27 DIAGNOSIS — Z6832 Body mass index (BMI) 32.0-32.9, adult: Secondary | ICD-10-CM | POA: Diagnosis not present

## 2022-06-27 DIAGNOSIS — N179 Acute kidney failure, unspecified: Secondary | ICD-10-CM

## 2022-06-27 DIAGNOSIS — R0602 Shortness of breath: Secondary | ICD-10-CM

## 2022-06-27 DIAGNOSIS — Z1331 Encounter for screening for depression: Secondary | ICD-10-CM | POA: Diagnosis not present

## 2022-06-27 DIAGNOSIS — E109 Type 1 diabetes mellitus without complications: Secondary | ICD-10-CM | POA: Diagnosis not present

## 2022-06-27 DIAGNOSIS — E669 Obesity, unspecified: Secondary | ICD-10-CM

## 2022-06-27 DIAGNOSIS — I10 Essential (primary) hypertension: Secondary | ICD-10-CM

## 2022-06-27 DIAGNOSIS — R5383 Other fatigue: Secondary | ICD-10-CM | POA: Diagnosis not present

## 2022-06-27 DIAGNOSIS — Z6831 Body mass index (BMI) 31.0-31.9, adult: Secondary | ICD-10-CM | POA: Diagnosis not present

## 2022-06-28 LAB — COMPREHENSIVE METABOLIC PANEL
ALT: 25 IU/L (ref 0–32)
AST: 27 IU/L (ref 0–40)
Albumin/Globulin Ratio: 1.4 (ref 1.2–2.2)
Albumin: 4.5 g/dL (ref 3.9–4.9)
Alkaline Phosphatase: 88 IU/L (ref 44–121)
BUN/Creatinine Ratio: 15 (ref 12–28)
BUN: 13 mg/dL (ref 8–27)
Bilirubin Total: 0.5 mg/dL (ref 0.0–1.2)
CO2: 27 mmol/L (ref 20–29)
Calcium: 9.9 mg/dL (ref 8.7–10.3)
Chloride: 103 mmol/L (ref 96–106)
Creatinine, Ser: 0.84 mg/dL (ref 0.57–1.00)
Globulin, Total: 3.3 g/dL (ref 1.5–4.5)
Glucose: 86 mg/dL (ref 70–99)
Potassium: 4.4 mmol/L (ref 3.5–5.2)
Sodium: 142 mmol/L (ref 134–144)
Total Protein: 7.8 g/dL (ref 6.0–8.5)
eGFR: 78 mL/min/{1.73_m2} (ref >59)

## 2022-06-28 LAB — URINALYSIS, ROUTINE W REFLEX MICROSCOPIC
Bilirubin, UA: NEGATIVE
Glucose, UA: NEGATIVE
Ketones, UA: NEGATIVE
Leukocytes,UA: NEGATIVE
Nitrite, UA: NEGATIVE
Protein,UA: NEGATIVE
RBC, UA: NEGATIVE
Specific Gravity, UA: 1.023 (ref 1.005–1.030)
Urobilinogen, Ur: 0.2 mg/dL (ref 0.2–1.0)
pH, UA: 6 (ref 5.0–7.5)

## 2022-06-28 LAB — LIPID PANEL WITH LDL/HDL RATIO
Cholesterol, Total: 234 mg/dL — ABNORMAL HIGH (ref 100–199)
HDL: 43 mg/dL (ref >39)
LDL Chol Calc (NIH): 165 mg/dL — ABNORMAL HIGH (ref 0–99)
LDL/HDL Ratio: 3.8 ratio — ABNORMAL HIGH (ref 0.0–3.2)
Triglycerides: 144 mg/dL (ref 0–149)
VLDL Cholesterol Cal: 26 mg/dL (ref 5–40)

## 2022-06-28 LAB — VITAMIN D 25 HYDROXY (VIT D DEFICIENCY, FRACTURES): Vit D, 25-Hydroxy: 11.4 ng/mL — ABNORMAL LOW (ref 30.0–100.0)

## 2022-06-28 LAB — VITAMIN B12: Vitamin B-12: 719 pg/mL (ref 232–1245)

## 2022-06-28 LAB — HEMOGLOBIN A1C
Est. average glucose Bld gHb Est-mCnc: 137 mg/dL
Hgb A1c MFr Bld: 6.4 % — ABNORMAL HIGH (ref 4.8–5.6)

## 2022-06-28 LAB — TSH: TSH: 1.07 u[IU]/mL (ref 0.450–4.500)

## 2022-06-28 LAB — INSULIN, RANDOM: INSULIN: 23.1 u[IU]/mL (ref 2.6–24.9)

## 2022-07-11 ENCOUNTER — Encounter (INDEPENDENT_AMBULATORY_CARE_PROVIDER_SITE_OTHER): Payer: Self-pay | Admitting: Internal Medicine

## 2022-07-11 ENCOUNTER — Other Ambulatory Visit (INDEPENDENT_AMBULATORY_CARE_PROVIDER_SITE_OTHER): Payer: Self-pay | Admitting: Internal Medicine

## 2022-07-11 ENCOUNTER — Ambulatory Visit (INDEPENDENT_AMBULATORY_CARE_PROVIDER_SITE_OTHER): Payer: BC Managed Care – PPO | Admitting: Internal Medicine

## 2022-07-11 VITALS — BP 137/85 | HR 70 | Temp 97.7°F | Ht 63.0 in | Wt 179.0 lb

## 2022-07-11 DIAGNOSIS — R7303 Prediabetes: Secondary | ICD-10-CM

## 2022-07-11 DIAGNOSIS — N179 Acute kidney failure, unspecified: Secondary | ICD-10-CM | POA: Diagnosis not present

## 2022-07-11 DIAGNOSIS — E559 Vitamin D deficiency, unspecified: Secondary | ICD-10-CM | POA: Diagnosis not present

## 2022-07-11 DIAGNOSIS — E7849 Other hyperlipidemia: Secondary | ICD-10-CM | POA: Diagnosis not present

## 2022-07-11 DIAGNOSIS — M069 Rheumatoid arthritis, unspecified: Secondary | ICD-10-CM

## 2022-07-11 DIAGNOSIS — I10 Essential (primary) hypertension: Secondary | ICD-10-CM

## 2022-07-11 DIAGNOSIS — Z6831 Body mass index (BMI) 31.0-31.9, adult: Secondary | ICD-10-CM

## 2022-07-11 DIAGNOSIS — E669 Obesity, unspecified: Secondary | ICD-10-CM

## 2022-07-11 MED ORDER — VITAMIN D (ERGOCALCIFEROL) 1.25 MG (50000 UNIT) PO CAPS: 50000.0000 [IU] | ORAL_CAPSULE | ORAL | 0 refills | Status: DC

## 2022-07-11 MED ORDER — METFORMIN HCL ER 500 MG PO TB24: 500.0000 mg | ORAL_TABLET | Freq: Every day | ORAL | 0 refills | Status: DC

## 2022-07-11 NOTE — Progress Notes (Signed)
Chief Complaint:   OBESITY Kari Williams (MR# [id number]) is a 61 y.o. female who presents for evaluation and treatment of obesity and related comorbidities. Current BMI is Body mass index is 31.89 kg/m. Tiffiney has been struggling with her weight for many years and has been unsuccessful in either losing weight, maintaining weight loss, or reaching her healthy weight goal.  Ieasha is currently in the action stage of change and ready to dedicate time achieving and maintaining a healthier weight. Laquanta is interested in becoming our patient and working on intensive lifestyle modifications including (but not limited to) diet and exercise for weight loss.  Vedanshi's habits were reviewed today and are as follows: her desired weight loss is 10 lbs, she has been heavy most of her life, she started gaining weight after the birth of her first child, her heaviest weight ever was 230 pounds, she has significant food cravings issues, she skips meals frequently, and she is frequently drinking liquids with calories.  Depression Screen Paulette's Food and Mood (modified PHQ-9) score was 4.  Subjective:   1. Other fatigue Linette admits to daytime somnolence and admits to waking up still tired. Patient has a history of symptoms of daytime fatigue, morning fatigue, and morning headache. Brisha generally gets 6 hours of sleep per night, and states that she has nightime awakenings. Snoring is not present. Apneic episodes are not present. Epworth Sleepiness Score is 6.   2. SOB (shortness of breath) on exertion Nazirah notes increasing shortness of breath with exercising and seems to be worsening over time with weight gain. She notes getting out of breath sooner with activity than she used to. This has not gotten worse recently. Eloyse denies shortness of breath at rest or orthopnea.  3. Hypertension, unspecified type Kristalynn's recent GFR decreased. She is on ARB, and her blood pressure is close  to goal. I discussed labs with the patient today.   4. AKI (acute kidney injury) ([place]) Maija's last GFR was 54 on Rheumatology labs. Baseline is 70's. She is not on NSAIDS. I discussed labs with the patient today.   Assessment/Plan:   1. Other fatigue Charlotta does feel that her weight is causing her energy to be lower than it should be. Fatigue may be related to obesity, depression or many other causes. Labs will be ordered, and in the meanwhile, Deette will focus on self care including making healthy food choices, increasing physical activity and focusing on stress reduction.  - EKG 12-Lead - Vitamin B12  2. SOB (shortness of breath) on exertion Dejana does feel that she gets out of breath more easily that she used to when she exercises. Marvina's shortness of breath appears to be obesity related and exercise induced. She has agreed to work on weight loss and gradually increase exercise to treat her exercise induced shortness of breath. Will continue to monitor closely.  3. Hypertension, unspecified type We will check labs today. Mayari will work on her weight loss therapy, and she will continue her ARB.   - Lipid Panel With LDL/HDL Ratio  4. AKI (acute kidney injury) ([place]) We will check labs today. Mercer will work on increasing her water intake and avoiding nephrotoxins. Will notify her PCP and Rheumatologist if her GFR is still decreased.  She is on methotrexate which may cause acute kidney injury.  - Comprehensive metabolic panel - Urinalysis, Routine w reflex microscopic  5. Depression screen [place] had a negative depression screening.   6. Obesity, current BMI  23 Kyrene is currently in the action stage of change and her goal is to continue with weight loss efforts. I recommend Cambrey begin the structured treatment plan as follows:  She has agreed to the Category 2 Plan.  Snack options were provided.   We will check labs today.   - Hemoglobin A1c - Insulin,  random - TSH - VITAMIN D 25 Hydroxy (Vit-D Deficiency, Fractures)  Exercise goals: All adults should avoid inactivity. Some physical activity is better than none, and adults who participate in any amount of physical activity gain some health benefits.   Behavioral modification strategies: increasing lean protein intake, decreasing simple carbohydrates, increasing vegetables, increasing water intake, decreasing liquid calories, decreasing eating out, no skipping meals, meal planning and cooking strategies, keeping healthy foods in the home, better snacking choices, avoiding temptations, and planning for success.  She was informed of the importance of frequent follow-up visits to maximize her success with intensive lifestyle modifications for her multiple health conditions. She was informed we would discuss her lab results at her next visit unless there is a critical issue that needs to be addressed sooner. Vonya agreed to keep her next visit at the agreed upon time to discuss these results.  Objective:   Blood pressure 130/84, pulse 76, temperature 98.1 F (36.7 C), height '5\' 3"'$  (1.6 m), weight 180 lb (81.6 kg), SpO2 95 %. Body mass index is 31.89 kg/m.  EKG: Normal sinus rhythm, rate 74 BPM.  Indirect Calorimeter completed today shows a VO2 of 253 and a REE of 1742.  Her calculated basal metabolic rate is 1610 thus her basal metabolic rate is better than expected.  General: Cooperative, alert, well developed, in no acute distress. HEENT: Conjunctivae and lids unremarkable. Cardiovascular: Regular rhythm.  Lungs: Normal work of breathing. Neurologic: No focal deficits.   Lab Results  Component Value Date   CREATININE 0.84 06/27/2022   BUN 13 06/27/2022   NA 142 06/27/2022   K 4.4 06/27/2022   CL 103 06/27/2022   CO2 27 06/27/2022   Lab Results  Component Value Date   ALT 25 06/27/2022   AST 27 06/27/2022   ALKPHOS 88 06/27/2022   BILITOT 0.5 06/27/2022   Lab Results   Component Value Date   HGBA1C 6.4 (H) 06/27/2022   Lab Results  Component Value Date   INSULIN 23.1 06/27/2022   Lab Results  Component Value Date   TSH 1.070 06/27/2022   Lab Results  Component Value Date   CHOL 234 (H) 06/27/2022   HDL 43 06/27/2022   LDLCALC 165 (H) 06/27/2022   TRIG 144 06/27/2022   Lab Results  Component Value Date   WBC 6.7 06/19/2022   HGB 13.5 06/19/2022   HCT 38.5 06/19/2022   MCV 97.7 06/19/2022   PLT 323 06/19/2022   No results found for: "IRON", "TIBC", "FERRITIN"  Attestation Statements:   Reviewed by clinician on day of visit: allergies, medications, problem list, medical history, surgical history, family history, social history, and previous encounter notes.  Time spent on visit including pre-visit chart review and post-visit charting and care was 40 minutes.   Wilhemena Durie, am acting as transcriptionist for Thomes Dinning, MD.  I have reviewed the above documentation for accuracy and completeness, and I agree with the above. -Thomes Dinning, MD

## 2022-07-11 NOTE — Progress Notes (Signed)
Chief Complaint:   OBESITY Kari Williams is here to discuss her progress with her obesity treatment plan along with follow-up of her obesity related diagnoses. Kari Williams is on the Category 2 Plan and states she is following her eating plan approximately 80% of the time. Kari Williams states she is using stationary bike 30 minutes 3 times per week.  Today's visit was #: 2 Starting weight: 180 lbs Starting date: 06/27/2022 Today's weight: 179 lbs Today's date: 07/11/2022 Total lbs lost to date: 1 Total lbs lost since last in-office visit: 1  Interim History: Kari Williams presents today for follow-up on progress and to review test results.  She reports adhering to plan closely and has also been journaling.  She does acknowledge exceeding prescribed calories some days but is learning as she goes.  She notes adequate satiety and satiation and decreased snacking.  She is making healthier choices when she eats out.  She denies any abnormal cravings or eating patterns.   Subjective:   1. Prediabetes Patient has elevated insulin, with an A1c of 6.4. She is at high risk for progression. Counseled on chronic state.  2. Vitamin D deficiency Vitamin D level 11.4. Diagnosis associated with adiposity, leptin resistance, and fatigue.  3. Other hyperlipidemia Patient's LDL is 165, triglycerides level is 234, and ASCVD risk is 12.2% The 10-year ASCVD risk score (Arnett DK, et al., 2019) is: 12.2%   Values used to calculate the score:     Age: 61 years     Sex: Female     Is Non-Hispanic African American: Yes     Diabetic: No     Tobacco smoker: No     Systolic Blood Pressure: 137 mmHg     Is BP treated: Yes     HDL Cholesterol: 43 mg/dL     Total Cholesterol: 234 mg/dL  4. AKI (acute kidney injury) (HCC) Resolved. GFR is back to baseline of 78.  5. Hypertension, unspecified type BP above target. BP monitoring at home is also elevated.  6. Rheumatoid arthritis, involving unspecified site, unspecified  whether rheumatoid factor present (HCC) Kari Williams is on methotrexate and Humira.  Assessment/Plan:   1. Prediabetes Weight loss therapy. The use and side effects of glucophage are discussed, particularly the incidence of lactic acidosis. The symptoms of this are weakness, trouble breathing, muscle pain. The patient agrees to avoid all alcohol, especially binge drinking; the need to discontinue the drug for 2 days before and 2 days after any contrast XRays is discussed; and before any major surgical procedures. The drug must be stopped in the event of acute heart disease or kidney problems. The commonest side effects such as bloating, diarrhea, gaseousness and nausea are discussed. Hypoglycemia is unlikely, but is discussed as well. We'll start at 500 mg QD, and titrate upwards depending on the patient's response. Renal and hepatic functions will be tested prior to starting this drug and periodically thereafter.   2. Vitamin D deficiency Start high dose Vitamin D 50,000 IU once weekly for 90 days.  Start- Vitamin D, Ergocalciferol, (DRISDOL) 1.25 MG (50000 UNIT) CAPS capsule; Take 1 capsule (50,000 Units total) by mouth every 7 (seven) days.  Dispense: 13 capsule; Refill: 0  3. Other hyperlipidemia Counseled on decreasing fats in diet and ASCVD risk. Patient would benefit from statin in addition to TLC.  4. AKI (acute kidney injury) (HCC) Resolved.  Continue to monitor with repeat CMP in 1 to 2 months.  5. Hypertension, unspecified type Monitor BP 3 times a week.  Pt advised to reach out to Dr. Nehemiah Settle for medication adjustment before visit in April.  6. Rheumatoid arthritis, involving unspecified site, unspecified whether rheumatoid factor present (HCC) Kari Williams was counseled on elevated CV risk. We need to work on BP control and Lipitor.  7. Obesity, current BMI 31.7 Kari Williams is currently in the action stage of change. As such, her goal is to continue with weight loss efforts. She has agreed to  the Category 2 Plan with lunch options.   Exercise goals:  As is  Behavioral modification strategies: increasing lean protein intake, decreasing simple carbohydrates, increasing water intake, meal planning and cooking strategies, keeping healthy foods in the home, avoiding temptations, planning for success, and keeping a strict food journal.  Kari Williams has agreed to follow-up with our clinic in 2-3 weeks. She was informed of the importance of frequent follow-up visits to maximize her success with intensive lifestyle modifications for her multiple health conditions.   Objective:   Blood pressure 137/85, pulse 70, temperature 97.7 F (36.5 C), height 5\' 3"  (1.6 m), weight 179 lb (81.2 kg), SpO2 99 %. Body mass index is 31.71 kg/m.  General: Cooperative, alert, well developed, in no acute distress. HEENT: Conjunctivae and lids unremarkable. Cardiovascular: Regular rhythm.  Lungs: Normal work of breathing. Neurologic: No focal deficits.   Lab Results  Component Value Date   CREATININE 0.84 06/27/2022   BUN 13 06/27/2022   NA 142 06/27/2022   K 4.4 06/27/2022   CL 103 06/27/2022   CO2 27 06/27/2022   Lab Results  Component Value Date   ALT 25 06/27/2022   AST 27 06/27/2022   ALKPHOS 88 06/27/2022   BILITOT 0.5 06/27/2022   Lab Results  Component Value Date   HGBA1C 6.4 (H) 06/27/2022   Lab Results  Component Value Date   INSULIN 23.1 06/27/2022   Lab Results  Component Value Date   TSH 1.070 06/27/2022   Lab Results  Component Value Date   CHOL 234 (H) 06/27/2022   HDL 43 06/27/2022   LDLCALC 165 (H) 06/27/2022   TRIG 144 06/27/2022   Lab Results  Component Value Date   VD25OH 11.4 (L) 06/27/2022   Lab Results  Component Value Date   WBC 6.7 06/19/2022   HGB 13.5 06/19/2022   HCT 38.5 06/19/2022   MCV 97.7 06/19/2022   PLT 323 06/19/2022    Attestation Statements:   Reviewed by clinician on day of visit: allergies, medications, problem list, medical  history, surgical history, family history, social history, and previous encounter notes.  Time spent on visit including pre-visit chart review and post-visit care and charting was 40 minutes.   I, Kyung Rudd, BS, CMA, am acting as transcriptionist for Worthy Rancher, MD.  I have reviewed the above documentation for accuracy and completeness, and I agree with the above. -Worthy Rancher, MD

## 2022-07-16 ENCOUNTER — Other Ambulatory Visit: Payer: Self-pay | Admitting: Internal Medicine

## 2022-07-16 DIAGNOSIS — H15003 Unspecified scleritis, bilateral: Secondary | ICD-10-CM

## 2022-07-16 DIAGNOSIS — H2013 Chronic iridocyclitis, bilateral: Secondary | ICD-10-CM

## 2022-07-16 NOTE — Telephone Encounter (Signed)
Next Visit: 09/26/2022  Last Visit: 06/19/2022  Last Fill: 01/29/2022  DX:Bilateral chronic anterior uveitis   Current Dose per office note 06/19/2022: Humira 40 mg subcu q. 14 days   Labs: 06/19/2022 Creat. 1.14, GFR 54, CO2 33, MCH 34.3  TB Gold: 06/19/2022 Neg    Okay to refill Humira?

## 2022-07-26 ENCOUNTER — Encounter (INDEPENDENT_AMBULATORY_CARE_PROVIDER_SITE_OTHER): Payer: Self-pay | Admitting: Internal Medicine

## 2022-07-26 ENCOUNTER — Ambulatory Visit (INDEPENDENT_AMBULATORY_CARE_PROVIDER_SITE_OTHER): Payer: BC Managed Care – PPO | Admitting: Internal Medicine

## 2022-07-26 VITALS — BP 121/76 | HR 94 | Temp 98.5°F | Ht 63.0 in | Wt 176.8 lb

## 2022-07-26 DIAGNOSIS — I10 Essential (primary) hypertension: Secondary | ICD-10-CM | POA: Diagnosis not present

## 2022-07-26 DIAGNOSIS — R7303 Prediabetes: Secondary | ICD-10-CM | POA: Diagnosis not present

## 2022-07-26 DIAGNOSIS — E66811 Obesity, class 1: Secondary | ICD-10-CM

## 2022-07-26 DIAGNOSIS — E559 Vitamin D deficiency, unspecified: Secondary | ICD-10-CM | POA: Diagnosis not present

## 2022-07-26 DIAGNOSIS — E669 Obesity, unspecified: Secondary | ICD-10-CM | POA: Diagnosis not present

## 2022-07-26 DIAGNOSIS — Z6831 Body mass index (BMI) 31.0-31.9, adult: Secondary | ICD-10-CM

## 2022-07-26 MED ORDER — METFORMIN HCL ER 500 MG PO TB24: 500.0000 mg | ORAL_TABLET | Freq: Every day | ORAL | 0 refills | Status: DC

## 2022-07-26 NOTE — Progress Notes (Signed)
Chief Complaint:   OBESITY Kari Williams is here to discuss her progress with her obesity treatment plan along with follow-up of her obesity related diagnoses. Kari Williams is on the Category 2 Plan with lunch options and states she is following her eating plan approximately 90% of the time. Kari Williams states she is biking and walking 3 times per week.    Today's visit was #: 3 Starting weight: 180 lbs Starting date: 06/27/2022 Today's weight: 176 lbs Today's date: 07/26/2022 Total lbs lost to date: 4 Total lbs lost since last in-office visit: 3  Interim History: Kari Williams presents today for follow-up.  Since last office visit she has lost 3 pounds.  She has good adherence to prescribed nutrition plan.  She reports adequate satiety and denies abnormal cravings or eating patterns.  Last office visit we started her on metformin XR for diabetes prevention and weight management.  She finds medication has helped with appetite and denies any adverse effects.  She is making healthier choices when eating out and is not skipping meals.  Her biometrics show a reduction in body fat percentage increase in muscle mass and decrease in visceral fat rating.  She feels well and has no organ-specific complaints.  We will be seeing patient in 4 weeks time as she will be traveling.  Subjective:   1. Hypertension, unspecified type Blood pressure today is well-controlled.  I reviewed home blood pressure monitoring she still having some blood pressures in the 150s mostly in the morning.  I counseled on risk of having elevated blood pressure.  She does take blood pressure medications in the evening so that should not be elevated.    2. Vitamin D deficiency Patient has a low vitamin D state which is associated with adiposity, leptin resistance, fatigue and weight gain.  She is on high-dose vitamin D without any adverse effects.    3. Prediabetes She is on metformin with no side effects. She had an hemoglobin A1c of 6.4.  She  has been counseled on disease state and risk of progression.  She is now on metformin without any adverse effects.    Assessment/Plan:   1. Hypertension, unspecified type I recommend that she check her blood pressure in the morning and also before bedtime to identify non-dipping which may increase risk of stroke.  She will continue on amlodipine and losartan as prescribed by PCP.  She may need a third agent.  2. Vitamin D deficiency She will continue vitamin D for 3 months we will check levels and if at goal, 50-60, she will be transitioned to over-the-counter supplementation.  3. Prediabetes She will continue on medication for prevention as well as medical nutrition therapy for weight loss.  We will repeat hemoglobin A1c in 6 months.  Orders: -     metFORMIN HCl ER; Take 1 tablet (500 mg total) by mouth daily with breakfast.  Dispense: 30 tablet; Refill: 0  4. Obesity, current BMI 31.3 Kari Williams is currently in the action stage of change. As such, her goal is to continue with weight loss efforts. She has agreed to the Category 2 Plan.   Exercise goals: As is.   Behavioral modification strategies: increasing lean protein intake, increasing vegetables, increasing water intake, no skipping meals, avoiding temptations, and planning for success.  Kari Williams has agreed to follow-up with our clinic in 3 to 4 weeks. She was informed of the importance of frequent follow-up visits to maximize her success with intensive lifestyle modifications for her multiple health conditions.  Objective:   Blood pressure 121/76, pulse 94, temperature 98.5 F (36.9 C), height '5\' 3"'$  (1.6 m), weight 176 lb 12.8 oz (80.2 kg), SpO2 99 %. Body mass index is 31.32 kg/m.  General: Cooperative, alert, well developed, in no acute distress. HEENT: Conjunctivae and lids unremarkable. Cardiovascular: Regular rhythm.  Lungs: Normal work of breathing. Neurologic: No focal deficits.   Lab Results  Component Value Date    CREATININE 0.84 06/27/2022   BUN 13 06/27/2022   NA 142 06/27/2022   K 4.4 06/27/2022   CL 103 06/27/2022   CO2 27 06/27/2022   Lab Results  Component Value Date   ALT 25 06/27/2022   AST 27 06/27/2022   ALKPHOS 88 06/27/2022   BILITOT 0.5 06/27/2022   Lab Results  Component Value Date   HGBA1C 6.4 (H) 06/27/2022   Lab Results  Component Value Date   INSULIN 23.1 06/27/2022   Lab Results  Component Value Date   TSH 1.070 06/27/2022   Lab Results  Component Value Date   CHOL 234 (H) 06/27/2022   HDL 43 06/27/2022   LDLCALC 165 (H) 06/27/2022   TRIG 144 06/27/2022   Lab Results  Component Value Date   VD25OH 11.4 (L) 06/27/2022   Lab Results  Component Value Date   WBC 6.7 06/19/2022   HGB 13.5 06/19/2022   HCT 38.5 06/19/2022   MCV 97.7 06/19/2022   PLT 323 06/19/2022   No results found for: "IRON", "TIBC", "FERRITIN"  Attestation Statements:   Reviewed by clinician on day of visit: allergies, medications, problem list, medical history, surgical history, family history, social history, and previous encounter notes.   Wilhemena Durie, am acting as transcriptionist for Thomes Dinning, MD.  I have reviewed the above documentation for accuracy and completeness, and I agree with the above. -Thomes Dinning, MD

## 2022-07-26 NOTE — Assessment & Plan Note (Signed)
Blood pressure today is well-controlled.  I reviewed home blood pressure monitoring she still having some blood pressures in the 150s mostly in the morning.  I counseled on risk of having elevated blood pressure.  She does take blood pressure medications in the evening so that should not be elevated.  I recommend that she check her blood pressure in the morning and also before bedtime to identify non-dipping which may increase risk of stroke.  She will continue on amlodipine and losartan as prescribed by PCP.  She may need a third agent.

## 2022-07-26 NOTE — Assessment & Plan Note (Signed)
Patient has a low vitamin D state which is associated with adiposity, leptin resistance, fatigue and weight gain.  She is on high-dose vitamin D without any adverse effects.  She will continue vitamin D for 3 months we will check levels and if at goal, 50-60, she will be transitioned to over-the-counter supplementation.

## 2022-07-26 NOTE — Assessment & Plan Note (Signed)
She had an hemoglobin A1c of 6.4.  She has been counseled on disease state and risk of progression.  She is now on metformin without any adverse effects.  She will continue on medication for prevention as well as medical nutrition therapy for weight loss.  We will repeat hemoglobin A1c in 6 months.

## 2022-08-04 DIAGNOSIS — J0100 Acute maxillary sinusitis, unspecified: Secondary | ICD-10-CM | POA: Diagnosis not present

## 2022-08-04 DIAGNOSIS — H6993 Unspecified Eustachian tube disorder, bilateral: Secondary | ICD-10-CM | POA: Diagnosis not present

## 2022-08-10 ENCOUNTER — Other Ambulatory Visit (INDEPENDENT_AMBULATORY_CARE_PROVIDER_SITE_OTHER): Payer: Self-pay | Admitting: Internal Medicine

## 2022-08-10 DIAGNOSIS — R7303 Prediabetes: Secondary | ICD-10-CM

## 2022-08-16 ENCOUNTER — Ambulatory Visit (INDEPENDENT_AMBULATORY_CARE_PROVIDER_SITE_OTHER): Payer: BC Managed Care – PPO | Admitting: Internal Medicine

## 2022-08-22 ENCOUNTER — Ambulatory Visit (INDEPENDENT_AMBULATORY_CARE_PROVIDER_SITE_OTHER): Payer: BC Managed Care – PPO | Admitting: Family Medicine

## 2022-08-22 ENCOUNTER — Ambulatory Visit (INDEPENDENT_AMBULATORY_CARE_PROVIDER_SITE_OTHER): Payer: BC Managed Care – PPO | Admitting: Internal Medicine

## 2022-08-22 ENCOUNTER — Encounter (INDEPENDENT_AMBULATORY_CARE_PROVIDER_SITE_OTHER): Payer: Self-pay

## 2022-08-22 ENCOUNTER — Encounter (INDEPENDENT_AMBULATORY_CARE_PROVIDER_SITE_OTHER): Payer: Self-pay | Admitting: Family Medicine

## 2022-08-22 VITALS — BP 148/78 | HR 78 | Temp 98.3°F | Ht 63.0 in

## 2022-08-22 DIAGNOSIS — R7303 Prediabetes: Secondary | ICD-10-CM | POA: Diagnosis not present

## 2022-08-22 DIAGNOSIS — I10 Essential (primary) hypertension: Secondary | ICD-10-CM | POA: Diagnosis not present

## 2022-08-22 DIAGNOSIS — E669 Obesity, unspecified: Secondary | ICD-10-CM | POA: Diagnosis not present

## 2022-08-22 DIAGNOSIS — Z6830 Body mass index (BMI) 30.0-30.9, adult: Secondary | ICD-10-CM | POA: Diagnosis not present

## 2022-08-28 DIAGNOSIS — H9042 Sensorineural hearing loss, unilateral, left ear, with unrestricted hearing on the contralateral side: Secondary | ICD-10-CM | POA: Diagnosis not present
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
## 2022-09-13 ENCOUNTER — Ambulatory Visit (INDEPENDENT_AMBULATORY_CARE_PROVIDER_SITE_OTHER): Payer: BC Managed Care – PPO | Admitting: Internal Medicine

## 2022-09-13 ENCOUNTER — Other Ambulatory Visit (INDEPENDENT_AMBULATORY_CARE_PROVIDER_SITE_OTHER): Payer: Self-pay | Admitting: Internal Medicine

## 2022-09-13 ENCOUNTER — Encounter (INDEPENDENT_AMBULATORY_CARE_PROVIDER_SITE_OTHER): Payer: Self-pay | Admitting: Internal Medicine

## 2022-09-13 VITALS — BP 125/80 | HR 92 | Temp 98.1°F | Ht 63.0 in | Wt 171.0 lb

## 2022-09-13 DIAGNOSIS — E669 Obesity, unspecified: Secondary | ICD-10-CM | POA: Diagnosis not present

## 2022-09-13 DIAGNOSIS — I10 Essential (primary) hypertension: Secondary | ICD-10-CM

## 2022-09-13 DIAGNOSIS — R7303 Prediabetes: Secondary | ICD-10-CM

## 2022-09-13 DIAGNOSIS — Z6830 Body mass index (BMI) 30.0-30.9, adult: Secondary | ICD-10-CM

## 2022-09-13 MED ORDER — METFORMIN HCL ER 500 MG PO TB24: 500.0000 mg | ORAL_TABLET | Freq: Two times a day (BID) | ORAL | 0 refills | Status: DC

## 2022-09-13 NOTE — Progress Notes (Signed)
Office: [phone]  /  Fax: [phone]  WEIGHT SUMMARY AND BIOMETRICS  Medical Weight Loss Height: 5' 3"$  (1.6 m) Weight: 171 lb (77.6 kg) Temp: 98.1 F (36.7 C) Pulse Rate: 92 BP: 125/80 SpO2: 100 % Fasting: no Labs: no Today's Visit #: 5 Weight at Last VIsit: 171 lb Weight Lost Since Last Visit: 0 lb  Body Fat %: 33.6 % Fat Mass (lbs): 57.4 lbs Muscle Mass (lbs): 107.8 lbs Total Body Water (lbs): 71.6 lbs Visceral Fat Rating : 9 Peak Weight: 230 lb Starting Date: 06/27/22 Starting Weight: 180 lb Total Weight Loss (lbs): 11 lb (4.99 kg)    HPI  Chief Complaint: OBESITY  Kari Williams is here to discuss her progress with her obesity treatment plan. She is on the the Category 2 Plan and states she is following her eating plan approximately 80 % of the time. She states she is exercising 30 minutes 3 times per week, 5000 steps 6 days a week.   Interval History:  Since last office visit she remains weight neutral.  She has been journaling her calories and is staying consistently under 1400 but above target of 1200.  She also acknowledges not getting enough protein at breakfast.  She reports adequate appetite control, satiety.  She denies abnormal cravings other than for sweets at times which she manages with a small piece of cookie.  She is making healthy choices when eating out.   Pharmacotherapy: Metformin with primary indication of prediabetes  PHYSICAL EXAM:  Blood pressure 125/80, pulse 92, temperature 98.1 F (36.7 C), height 5' 3"$  (1.6 m), weight 171 lb (77.6 kg), SpO2 100 %. Body mass index is 30.29 kg/m.  General: She is overweight, cooperative, alert, well developed, and in no acute distress. PSYCH: Has normal mood, affect and thought process.   HEENT: EOMI, sclerae are anicteric. Lungs: Normal breathing effort, no conversational dyspnea. Extremities: No edema.  Neurologic: No gross sensory or motor deficits. No tremors or fasciculations noted.     DIAGNOSTIC DATA REVIEWED:  BMET    Component Value Date/Time   NA 142 06/27/2022 1007   K 4.4 06/27/2022 1007   CL 103 06/27/2022 1007   CO2 27 06/27/2022 1007   GLUCOSE 86 06/27/2022 1007   GLUCOSE 88 06/19/2022 1355   BUN 13 06/27/2022 1007   CREATININE 0.84 06/27/2022 1007   CREATININE 1.14 (H) 06/19/2022 1355   CALCIUM 9.9 06/27/2022 1007   GFRNONAA 66 12/07/2020 1518   GFRAA 77 12/07/2020 1518   Lab Results  Component Value Date   HGBA1C 6.4 (H) 06/27/2022   Lab Results  Component Value Date   INSULIN 23.1 06/27/2022   Lab Results  Component Value Date   TSH 1.070 06/27/2022   CBC    Component Value Date/Time   WBC 6.7 06/19/2022 1355   RBC 3.94 06/19/2022 1355   HGB 13.5 06/19/2022 1355   HCT 38.5 06/19/2022 1355   PLT 323 06/19/2022 1355   MCV 97.7 06/19/2022 1355   MCH 34.3 (H) 06/19/2022 1355   MCHC 35.1 06/19/2022 1355   RDW 12.7 06/19/2022 1355   Iron Studies No results found for: "IRON", "TIBC", "FERRITIN", "IRONPCTSAT" Lipid Panel     Component Value Date/Time   CHOL 234 (H) 06/27/2022 1007   TRIG 144 06/27/2022 1007   HDL 43 06/27/2022 1007   LDLCALC 165 (H) 06/27/2022 1007   Hepatic Function Panel     Component Value Date/Time   PROT 7.8 06/27/2022 1007   ALBUMIN 4.5 06/27/2022  1007   AST 27 06/27/2022 1007   ALT 25 06/27/2022 1007   ALKPHOS 88 06/27/2022 1007   BILITOT 0.5 06/27/2022 1007      Component Value Date/Time   TSH 1.070 06/27/2022 1007   Nutritional Lab Results  Component Value Date   VD25OH 11.4 (L) 06/27/2022     ASSESSMENT AND PLAN  TREATMENT PLAN FOR OBESITY:  Recommended Dietary Goals  Kari Williams is currently in the action stage of change. As such, her goal is to continue weight management plan. She has agreed to the Category 2 Plan and keeping a food journal and adhering to recommended goals of 1200 cal calories and 30 g of protein per meal protein.  Behavioral Intervention  We discussed the  following Behavioral Modification Strategies today: increasing lean protein intake, increasing vegetables, increasing lower sugar fruits, increasing fiber rich foods, increase water intake, work on meal planning and easy cooking plans, and work on tracking and journaling calories using tracking App.  Additional resources provided today: NA  Recommended Physical Activity Goals  Kari Williams has been advised to work up to 150 minutes of moderate intensity aerobic activity a week and strengthening exercises 2-3 times per week for cardiovascular health, weight loss maintenance and preservation of muscle mass.   She has agreed to increase physical activity in their day and reduce sedentary time (increase NEAT).  and continue physical activity as is.    Pharmacotherapy We discussed various medication options to help Kari Williams with her weight loss efforts and we both agreed to increasing metformin to twice a day for diabetes prevention and incretin effect.  ASSOCIATED CONDITIONS ADDRESSED TODAY  Obesity, current BMI 30.3  Prediabetes Assessment & Plan: Most recent A1c is  Lab Results  Component Value Date   HGBA1C 6.4 (H) 06/27/2022   with associated elevated insulin levels.  Patient informed of disease state and risk of progression. This may contribute to abnormal cravings, fatigue and diabetes complications without having diabetes.   Continue with reduced calorie nutrition plan.  We will increase metformin to twice a day for diabetes prevention.   Orders: -     metFORMIN HCl ER; Take 1 tablet (500 mg total) by mouth 2 (two) times daily with a meal.  Dispense: 60 tablet; Refill: 0  Hypertension, unspecified type Assessment & Plan: Blood pressure has improved.  She is currently on amlodipine 10 mg a day and losartan 100 mg.  Most recent renal parameters within normal limits.  Continue current regimen, monitor for orthostasis while losing weight.       No follow-ups on file.Marland Kitchen She was  informed of the importance of frequent follow up visits to maximize her success with intensive lifestyle modifications for her multiple health conditions.   ATTESTASTION STATEMENTS:  Reviewed by clinician on day of visit: allergies, medications, problem list, medical history, surgical history, family history, social history, and previous encounter notes.      Thomes Dinning, MD

## 2022-09-13 NOTE — Assessment & Plan Note (Signed)
Blood pressure has improved.  She is currently on amlodipine 10 mg a day and losartan 100 mg.  Most recent renal parameters within normal limits.  Continue current regimen, monitor for orthostasis while losing weight.

## 2022-09-13 NOTE — Assessment & Plan Note (Signed)
Most recent A1c is  Lab Results  Component Value Date   HGBA1C 6.4 (H) 06/27/2022   with associated elevated insulin levels.  Patient informed of disease state and risk of progression. This may contribute to abnormal cravings, fatigue and diabetes complications without having diabetes.   Continue with reduced calorie nutrition plan.  We will increase metformin to twice a day for diabetes prevention.

## 2022-09-26 ENCOUNTER — Ambulatory Visit: Payer: BC Managed Care – PPO | Admitting: Internal Medicine

## 2022-09-26 NOTE — Progress Notes (Deleted)
Office Visit Note  Patient: Kari Williams             Date of Birth: 06/04/1961           MRN: [id number]             PCP: Seward Carol, MD Referring: Seward Carol, MD Visit Date: 09/26/2022   Subjective:  No chief complaint on file.   History of Present Illness: Kari Williams is a 62 y.o. female here for follow up ***   Previous HPI 06/19/22 Kari Williams is a 62 y.o. 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 After last visit right knee pain improved and doing well now. She had follow up with Dr. Manuella Ghazi eye exam negative for active uveitis although had some dryness and surface irritation. Getting restasis approved for this. She also has migraines which were a problem I the past but had been free of these for years until recently.    Previous HPI 03/15/22 Kari Williams is a 62 y.o. 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 She had a UTI in June treated with nitrofurantoin then switched to cefuroxime based on susceptibilities and symptoms resolved.  Overall she is doing well no symptomatic complaints with eye irritation or vision changes.  She has some right knee pain and stiffness does not severely limit her mobility.  Symptoms are most noticeable with certain movements such as descending stairs.     Previous HPI 12/13/2021 Kari Williams is a 62 y.o. female here for follow up for uveiitis and scleritis on methotrexate 12.5 mg PO weekly and Humira [place] q14days. Since our last visit her finger pain is improved not needing any additional NSAIDs for symptom management. She has floaters but no new visual complaint no eye redness or pain.   Previous HPI 09/13/2021 Kari Williams is a 62 y.o. 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 Last visit with Dr. Manuella Ghazi 08/01/21. Eyes are doing okay without  new complaints and exam was okay. She has noticed some increased pain and swelling affecting left hand fingers also mildly tender nodule swelling behind her left ear.   Previous HPI 06/14/21 Kari Williams is a 62 y.o. 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 She has experienced small painless skin rashes in a few places including the left elbow these improved with conservative treatment moisturizing. She has left calf cramps at night ongoing no problems during the day or with mobility. Bilateral eye dryness is slightly worse than before during day time, using lubricating eye drops more than before. She has future appointment with Dr. Manuella Ghazi scheduled for next week.   Previous HPI 06/07/20 Kari Williams is a 62 y.o. 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 She has a previous history of treatment with methotrexate for iritis in 2010-2015 with Dr. Herbert Deaner and Dr. Charlestine Night. She had been off any treatment but visit with Dr. 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  Since earlier this year she has experienced eye redness watering and blurry vision.  She is more symptomatic complaint on the right than on the left currently but both sides bother her.  Besides the  eye complaints she does also report some fairly diffuse arthralgias and morning stiffness up to 30 minutes.  She has not noticed any specific joint swelling warmth or erythema.  In the past she has also been treated with oral or ocular steroids but has not received any for the current relapse of symptoms.   No Rheumatology ROS completed.   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   Nuclear sclerotic cataract of both eyes 10/13/2015   Epiretinal membrane (ERM) of right eye 10/13/2015   Lumbosacral radiculitis 09/01/2014   Routine gynecological examination 11/19/2011   Abdominal or pelvic swelling, mass, or lump, generalized 11/19/2011   Overweight (BMI 25.0-29.9) 11/19/2011   Fibroids 11/19/2011   HTN (hypertension) 11/19/2011   Condyloma 11/19/2011    Past Medical History:  Diagnosis Date   Allergy    seasonal   Anxiety    Arthritis    osteoarthritis/ddd   Chest pain    Depression    Female infertility    Hypertension    Rheumatoid arthritis ([place])     Family History  Problem Relation Age of Onset   Dementia Mother    Hypotension Mother    Stroke Father    Hypertension Father    Sudden death Father    Breast cancer Sister    Hypertension Sister    Thyroid disease Sister    Colon cancer Brother    Sleep apnea Brother    Stroke Brother    Lupus Paternal Aunt    Lupus Niece    Past Surgical History:  Procedure Laterality Date   ABDOMINAL HYSTERECTOMY     CARPAL TUNNEL RELEASE  07/30/2000   right   COLONOSCOPY     LUMBAR DISC SURGERY  07/30/2004   LUMBAR FUSION  07/30/2009   THORACIC [place] SURGERY  07/30/2000   TUBAL LIGATION  07/02/2012   Procedure: BILATERAL TUBAL LIGATION;  Surgeon: Ena Dawley, MD;  Location: [facility];  Service: Gynecology;  Laterality: Bilateral;   UPPER GASTROINTESTINAL ENDOSCOPY     VAGINAL HYSTERECTOMY  07/02/2012   Procedure: HYSTERECTOMY VAGINAL;  Surgeon: Ena Dawley, MD;  Location: [facility];  Service: Gynecology;   Laterality: N/A;   Social History   Social History Narrative   Not on file   Immunization History  Administered Date(s) Administered   Influenza Split 07/03/2012   PFIZER(Purple Top)SARS-COV-2 Vaccination 10/13/2019, 11/03/2019     Objective: Vital Signs: There were no vitals taken for this visit.   Physical Exam   Musculoskeletal Exam: ***  CDAI Exam: CDAI Score: -- Patient Global: --; Provider Global: -- Swollen: --; Tender: -- Joint Exam 09/26/2022   No joint exam has been documented for this visit   There is currently no information documented on the homunculus. Go to the Rheumatology activity and complete the homunculus joint exam.  Investigation: No additional findings.  Imaging: No results found.  Recent Labs: Lab Results  Component Value Date   WBC 6.7 06/19/2022   HGB 13.5 06/19/2022   PLT 323 06/19/2022   NA 142 06/27/2022   K 4.4 06/27/2022   CL 103 06/27/2022   CO2 27 06/27/2022   GLUCOSE 86 06/27/2022   BUN 13 06/27/2022   CREATININE 0.84 06/27/2022   BILITOT 0.5 06/27/2022   ALKPHOS 88 06/27/2022   AST 27 06/27/2022   ALT 25 06/27/2022   PROT 7.8 06/27/2022   ALBUMIN 4.5 06/27/2022   CALCIUM 9.9 06/27/2022   GFRAA 77 12/07/2020   QFTBGOLDPLUS NEGATIVE 06/19/2022    Speciality Comments: No specialty comments available.  Procedures:  No procedures performed Allergies: Penicillins   Assessment / Plan:     Visit Diagnoses: No diagnosis found.  ***  Orders: No orders of the defined types were placed in this encounter.  No orders of the defined types were placed in this encounter.    Follow-Up Instructions: No follow-ups on file.   Collier Salina, MD  Note - This record has been created using [employer].  Chart creation errors have been sought, but may not always  have been located. Such creation errors do not reflect on  the standard of medical care.

## 2022-09-27 ENCOUNTER — Ambulatory Visit (INDEPENDENT_AMBULATORY_CARE_PROVIDER_SITE_OTHER): Payer: BC Managed Care – PPO | Admitting: Physician Assistant

## 2022-09-27 ENCOUNTER — Encounter (INDEPENDENT_AMBULATORY_CARE_PROVIDER_SITE_OTHER): Payer: Self-pay | Admitting: Physician Assistant

## 2022-09-27 VITALS — BP 129/85 | HR 78 | Temp 98.1°F | Ht 63.0 in | Wt 167.8 lb

## 2022-09-27 DIAGNOSIS — E669 Obesity, unspecified: Secondary | ICD-10-CM

## 2022-09-27 DIAGNOSIS — E559 Vitamin D deficiency, unspecified: Secondary | ICD-10-CM

## 2022-09-27 DIAGNOSIS — R7303 Prediabetes: Secondary | ICD-10-CM

## 2022-09-27 DIAGNOSIS — Z6829 Body mass index (BMI) 29.0-29.9, adult: Secondary | ICD-10-CM | POA: Diagnosis not present

## 2022-09-27 MED ORDER — VITAMIN D (ERGOCALCIFEROL) 1.25 MG (50000 UNIT) PO CAPS: 50000.0000 [IU] | ORAL_CAPSULE | ORAL | 0 refills | Status: DC

## 2022-09-27 NOTE — Progress Notes (Signed)
Office: [phone]  /  Fax: [phone]  WEIGHT SUMMARY AND BIOMETRICS  Vitals Temp: 98.1 F (36.7 C) BP: 129/85 Pulse Rate: 78 SpO2: 100 %   Anthropometric Measurements Height: '5\' 3"'$  (1.6 m) Weight: 167 lb 12.8 oz (76.1 kg) BMI (Calculated): 29.73   Body Composition  Body Fat %: 32.6 % Fat Mass (lbs): 54.8 lbs Muscle Mass (lbs): 107.2 lbs Total Body Water (lbs): 71.2 lbs Visceral Fat Rating : 9   Today's visit was #: 6 Starting weight: 180 lbs Starting date: 06/27/2022 Today's weight: 167 lbs Today's date: 09/27/2022 Total lbs lost to date: 15 lbs Total lbs lost since last in-office visit: 5  HPI  Chief Complaint: OBESITY  [name] is here to discuss her progress with her obesity treatment plan. She is on the the Category 2 Plan and keeping a food journal and adhering to recommended goals of 1200 calories and 90 grams of protein and states she is following her eating plan approximately 90 % of the time. She states she is exercising walking or weights 20 minutes 3-5 times per week.  Interval History:  Since last office visit she has done very well with weight loss. She reports excellent adherence to prescribed reduced calorie nutrition plan. She has been working on getting in adequate protein at all meals. Using some protein shakes to help meet needs.  '[x]'$ Denies '[]'$ Reports problems with appetite and hunger signals.  '[x]'$ Denies '[]'$ Reports problems with satiety and satiation.  '[x]'$ Denies '[]'$ Reports problems with eating patterns and portion control.  '[x]'$ Denies '[]'$ Reports abnormal cravings Sleep is slightly improved from previous with increased exercise.  Stress levels are reported as low and manageable.  Barriers identified none.   Pharmacotherapy for weight loss: She is currently taking no anti-obesity medication and Metformin (off label use for incretin effect and / or insulin resistance and / or diabetes prevention) .     ASSESSMENT AND PLAN  TREATMENT PLAN  FOR OBESITY:  Recommended Dietary Goals  [name] is currently in the action stage of change. As such, her goal is to continue weight management plan. She has agreed to: the Category 2 Plan and keeping a food journal and adhering to recommended goals of 1200 calories and 90 grams of protein.  Behavioral Intervention  We discussed the following Behavioral Modification Strategies today: increasing lean protein intake, increasing vegetables, avoiding skipping meals, and increasing water intake.  Additional resources provided today: NA  Recommended Physical Activity Goals  Song has been advised to work up to 150 minutes of moderate intensity aerobic activity a week and strengthening exercises 2-3 times per week for cardiovascular health, weight loss maintenance and preservation of muscle mass.   She has agreed to : '[x]'$  Continue current level of physical activity '[]'$  Start strengthening exercises with a goal of 2-3 sessions a week   '[]'$  Start aerobic activity with a goal of 150 minutes a week at moderate intensity. '[]'$ Increase intensity, frequency or duration of strengthening exercises '[]'$ Increase the intensity, frequency or duration of aerobic exercises  '[]'$ Increase physical activity in their day and reduce sedentary time (increase NEAT). '[]'$  Work on scheduling and tracking physical activity.   Pharmacotherapy We discussed various medication options to help [name] with her weight loss efforts and we both agreed to : '[x]'$  Continue with nutritional and behavioral strategies '[]'$  Continue current antiobesity medication (AOM). '[]'$ Start AOM   ASSOCIATED CONDITIONS ADDRESSED TODAY  Prediabetes  Vitamin D deficiency -     Vitamin D (Ergocalciferol); Take 1 capsule (50,000 Units total) by mouth  every 7 (seven) days.  Dispense: 13 capsule; Refill: 0  Obesity- Start BMI 31.92  BMI 29.0-29.9,adult Current BMI 29.7  Prediabetes Last A1c was 6.4  Medication(s):  Metformin 500 mg twice daily with  meals Lab Results  Component Value Date   HGBA1C 6.4 (H) 06/27/2022   Lab Results  Component Value Date   INSULIN 23.1 06/27/2022    Plan: Continue Working on nutrition plan to decrease simple carbohydrates, increase lean proteins and exercise to promote weight loss, improve glycemic control and prevent progression to Type 2 diabetes.  Continue Metformin 500 mg twice daily with meals  Vitamin D Deficiency Vitamin D is not at goal of 50.  Most recent vitamin D level was 11.4. She is on  prescription ergocalciferol 50,000 IU weekly. Lab Results  Component Value Date   VD25OH 11.4 (L) 06/27/2022   Plan: Refill prescription vitamin D 50,000 IU weekly.   PHYSICAL EXAM:  Blood pressure 129/85, pulse 78, temperature 98.1 F (36.7 C), height '5\' 3"'$  (1.6 m), weight 167 lb 12.8 oz (76.1 kg), SpO2 100 %. Body mass index is 29.72 kg/m.  General: She is overweight, cooperative, alert, well developed, and in no acute distress. PSYCH: Has normal mood, affect and thought process.   HEENT: EOMI, sclerae are anicteric. Lungs: Normal breathing effort, no conversational dyspnea. Extremities: No edema.  Neurologic: No gross sensory or motor deficits. No tremors or fasciculations noted.    DIAGNOSTIC DATA REVIEWED:  BMET    Component Value Date/Time   NA 142 06/27/2022 1007   K 4.4 06/27/2022 1007   CL 103 06/27/2022 1007   CO2 27 06/27/2022 1007   GLUCOSE 86 06/27/2022 1007   GLUCOSE 88 06/19/2022 1355   BUN 13 06/27/2022 1007   CREATININE 0.84 06/27/2022 1007   CREATININE 1.14 (H) 06/19/2022 1355   CALCIUM 9.9 06/27/2022 1007   GFRNONAA 66 12/07/2020 1518   GFRAA 77 12/07/2020 1518   Lab Results  Component Value Date   HGBA1C 6.4 (H) 06/27/2022   Lab Results  Component Value Date   INSULIN 23.1 06/27/2022   Lab Results  Component Value Date   TSH 1.070 06/27/2022   CBC    Component Value Date/Time   WBC 6.7 06/19/2022 1355   RBC 3.94 06/19/2022 1355   HGB 13.5  06/19/2022 1355   HCT 38.5 06/19/2022 1355   PLT 323 06/19/2022 1355   MCV 97.7 06/19/2022 1355   MCH 34.3 (H) 06/19/2022 1355   MCHC 35.1 06/19/2022 1355   RDW 12.7 06/19/2022 1355   Iron Studies No results found for: "IRON", "TIBC", "FERRITIN", "IRONPCTSAT" Lipid Panel     Component Value Date/Time   CHOL 234 (H) 06/27/2022 1007   TRIG 144 06/27/2022 1007   HDL 43 06/27/2022 1007   LDLCALC 165 (H) 06/27/2022 1007   Hepatic Function Panel     Component Value Date/Time   PROT 7.8 06/27/2022 1007   ALBUMIN 4.5 06/27/2022 1007   AST 27 06/27/2022 1007   ALT 25 06/27/2022 1007   ALKPHOS 88 06/27/2022 1007   BILITOT 0.5 06/27/2022 1007      Component Value Date/Time   TSH 1.070 06/27/2022 1007   Nutritional Lab Results  Component Value Date   VD25OH 11.4 (L) 06/27/2022     Return in about 2 weeks (around 10/11/2022) for X 2.. She was informed of the importance of frequent follow up visits to maximize her success with intensive lifestyle modifications for her multiple health conditions.   ATTESTASTION  STATEMENTS:  Reviewed by clinician on day of visit: allergies, medications, problem list, medical history, surgical history, family history, social history, and previous encounter notes.   Time spent on visit including pre-visit chart review and post-visit care and charting was 32 minutes.   Annalycia Done,PA-C

## 2022-10-01 ENCOUNTER — Other Ambulatory Visit: Payer: Self-pay | Admitting: Internal Medicine

## 2022-10-01 DIAGNOSIS — H2013 Chronic iridocyclitis, bilateral: Secondary | ICD-10-CM

## 2022-10-01 DIAGNOSIS — H15003 Unspecified scleritis, bilateral: Secondary | ICD-10-CM

## 2022-10-01 NOTE — Telephone Encounter (Signed)
Next Visit: 10/11/2022  Last Visit: 06/19/2022  Last Fill: 10/05/2021  DX: Uveitis   Current Dose per office note 06/19/2022: methotrexate 12.5 mg p.o. weekly   Labs: 06/27/2022 CMP WNL 06/19/2022 MCH 34.3  Patient to update labs at upcoming appointment on 10/11/2022   Okay to refill MTX?

## 2022-10-09 ENCOUNTER — Other Ambulatory Visit (INDEPENDENT_AMBULATORY_CARE_PROVIDER_SITE_OTHER): Payer: Self-pay | Admitting: Internal Medicine

## 2022-10-09 DIAGNOSIS — R7303 Prediabetes: Secondary | ICD-10-CM

## 2022-10-10 DIAGNOSIS — E7800 Pure hypercholesterolemia, unspecified: Secondary | ICD-10-CM | POA: Diagnosis not present

## 2022-10-10 DIAGNOSIS — I10 Essential (primary) hypertension: Secondary | ICD-10-CM | POA: Diagnosis not present

## 2022-10-10 DIAGNOSIS — Z0000 Encounter for general adult medical examination without abnormal findings: Secondary | ICD-10-CM | POA: Diagnosis not present

## 2022-10-10 DIAGNOSIS — R7303 Prediabetes: Secondary | ICD-10-CM | POA: Diagnosis not present

## 2022-10-10 DIAGNOSIS — Z131 Encounter for screening for diabetes mellitus: Secondary | ICD-10-CM | POA: Diagnosis not present

## 2022-10-10 DIAGNOSIS — F419 Anxiety disorder, unspecified: Secondary | ICD-10-CM | POA: Diagnosis not present

## 2022-10-11 ENCOUNTER — Encounter: Payer: Self-pay | Admitting: Internal Medicine

## 2022-10-11 ENCOUNTER — Ambulatory Visit: Payer: BC Managed Care – PPO | Attending: Internal Medicine | Admitting: Internal Medicine

## 2022-10-11 VITALS — BP 127/79 | HR 80 | Resp 14 | Ht 63.0 in | Wt 176.0 lb

## 2022-10-11 DIAGNOSIS — Z79899 Other long term (current) drug therapy: Secondary | ICD-10-CM | POA: Diagnosis not present

## 2022-10-11 DIAGNOSIS — H2013 Chronic iridocyclitis, bilateral: Secondary | ICD-10-CM | POA: Diagnosis not present

## 2022-10-11 DIAGNOSIS — H15003 Unspecified scleritis, bilateral: Secondary | ICD-10-CM

## 2022-10-11 MED ORDER — HUMIRA (2 PEN) 40 MG/0.4ML ~~LOC~~ AJKT: AUTO-INJECTOR | SUBCUTANEOUS | 2 refills | Status: DC

## 2022-10-11 NOTE — Progress Notes (Signed)
Office Visit Note  Patient: Kari Williams             Date of Birth: [birth year]           MRN: [id number]             PCP: Seward Carol, MD Referring: Seward Carol, MD Visit Date: 10/11/2022   Subjective:  Follow-up   History of Present Illness: Kari Williams is a 62 y.o. female here for follow up follow-up for uveitis and scleritis on Humira 40 mg subcu q. 14 days methotrexate 12.5 mg p.o. weekly and folic acid 2 mg daily.  Overall she is doing pretty well since last visit still gets some knee pain without a lot of swelling.  Eye dryness and mild irritation no serious pain inflammation or visual acuity changes.  Most recent ophthalmology exam looked good.  She had recent labs obtained earlier this month with a normal complete blood count metabolic panel showed mild AST elevation to 57.   Previous HPI 06/19/22 Kari Williams is a 62 y.o. female here for follow up for uveitis and scleritis on Humira 40 mg [place] q14days and MTX 12.5 mg PO weekly and folic acid 2 mg daily. After last visit right knee pain improved and doing well now. She had follow up with Dr. Manuella Ghazi eye exam negative for active uveitis although had some dryness and surface irritation. Getting restasis approved for this. She also has migraines which were a problem I the past but had been free of these for years until recently.    Previous HPI 03/15/22 Kari Williams is a 62 y.o. 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 She had a UTI in June treated with nitrofurantoin then switched to cefuroxime based on susceptibilities and symptoms resolved.  Overall she is doing well no symptomatic complaints with eye irritation or vision changes.  She has some right knee pain and stiffness does not severely limit her mobility.  Symptoms are most noticeable with certain movements such as descending stairs.     Previous HPI 12/13/2021 Kari Williams is a 62 y.o. female here for follow up for uveiitis  and scleritis on methotrexate 12.5 mg PO weekly and Humira [place] q14days. Since our last visit her finger pain is improved not needing any additional NSAIDs for symptom management. She has floaters but no new visual complaint no eye redness or pain.   Previous HPI 09/13/2021 Kari Williams is a 62 y.o. female here for follow up for uveiitis and scleritis on methotrexate 15 mg PO weekly and Humira [place] q14days. Last visit with Dr. Manuella Ghazi 08/01/21. Eyes are doing okay without new complaints and exam was okay. She has noticed some increased pain and swelling affecting left hand fingers also mildly tender nodule swelling behind her left ear.   Previous HPI 06/14/21 Kari Williams is a 62 y.o. female here for follow up for uveitis and scleritis on methotrexate 15 mg PO weekly and Humira 40 mg [place] q14days. She has experienced small painless skin rashes in a few places including the left elbow these improved with conservative treatment moisturizing. She has left calf cramps at night ongoing no problems during the day or with mobility. Bilateral eye dryness is slightly worse than before during day time, using lubricating eye drops more than before. She has future appointment with Dr. Manuella Ghazi scheduled for next week.   Previous HPI 06/07/20 Kari Williams is a 62 y.o. female here for evaluation and management  of uveitis. She has a previous history of treatment with methotrexate for iritis in 2010-2015 with Dr. Herbert Deaner and Dr. Charlestine Night. She had been off any treatment but visit with Dr. 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  Since earlier this year she has experienced eye redness watering and blurry vision.  She is more symptomatic complaint on the right than on the left currently but both sides bother her.  Besides the eye complaints she does also report some fairly diffuse arthralgias and morning stiffness up to 30 minutes.  She has not  noticed any specific joint swelling warmth or erythema.  In the past she has also been treated with oral or ocular steroids but has not received any for the current relapse of symptoms.   Review of Systems  Constitutional:  Negative for fatigue.  HENT:  Negative for mouth sores and mouth dryness.   Eyes:  Positive for dryness.  Respiratory:  Negative for shortness of breath.   Cardiovascular:  Negative for chest pain and palpitations.  Gastrointestinal:  Negative for blood in stool, constipation and diarrhea.  Endocrine: Negative for increased urination.  Genitourinary:  Negative for involuntary urination.  Musculoskeletal:  Negative for joint pain, gait problem, joint pain, joint swelling, myalgias, muscle weakness, morning stiffness, muscle tenderness and myalgias.  Skin:  Negative for color change, rash, hair loss and sensitivity to sunlight.  Allergic/Immunologic: Negative for susceptible to infections.  Neurological:  Negative for dizziness and headaches.  Hematological:  Negative for swollen glands.  Psychiatric/Behavioral:  Positive for depressed mood and sleep disturbance. The patient is nervous/anxious.     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   Nuclear sclerotic cataract of both eyes 10/13/2015   Epiretinal membrane (ERM) of right eye 10/13/2015   Lumbosacral radiculitis 09/01/2014    Routine gynecological examination 11/19/2011   Abdominal or pelvic swelling, mass, or lump, generalized 11/19/2011   Overweight (BMI 25.0-29.9) 11/19/2011   Fibroids 11/19/2011   HTN (hypertension) 11/19/2011   Condyloma 11/19/2011    Past Medical History:  Diagnosis Date   Allergy    seasonal   Anxiety    Arthritis    osteoarthritis/ddd   Chest pain    Depression    Female infertility    Hypertension    Rheumatoid arthritis ([place])     Family History  Problem Relation Age of Onset   Dementia Mother    Hypotension Mother    Stroke Father    Hypertension Father    Sudden death Father    Breast cancer Sister    Hypertension Sister    Thyroid disease Sister    Colon cancer Brother    Sleep apnea Brother    Stroke Brother    Lupus Paternal Aunt    Lupus Niece    Past Surgical History:  Procedure Laterality Date  ABDOMINAL HYSTERECTOMY     CARPAL TUNNEL RELEASE  07/30/2000   right   COLONOSCOPY     LUMBAR [place] SURGERY  07/30/2004   LUMBAR FUSION  07/30/2009   THORACIC [place] SURGERY  07/30/2000   TUBAL LIGATION  07/02/2012   Procedure: BILATERAL TUBAL LIGATION;  Surgeon: Ena Dawley, MD;  Location: [facility];  Service: Gynecology;  Laterality: Bilateral;   UPPER GASTROINTESTINAL ENDOSCOPY     VAGINAL HYSTERECTOMY  07/02/2012   Procedure: HYSTERECTOMY VAGINAL;  Surgeon: Ena Dawley, MD;  Location: [facility];  Service: Gynecology;  Laterality: N/A;   Social History   Social History Narrative   Not on file   Immunization History  Administered Date(s) Administered   Influenza Split 07/03/2012   PFIZER(Purple Top)SARS-COV-2 Vaccination 10/13/2019, 11/03/2019     Objective: Vital Signs: BP 127/79 (BP Location: Left Arm, Patient Position: Sitting, Cuff Size: Normal)   Pulse 80   Resp 14   Ht 5\' 3"  (1.6 m)   Wt 176 lb (79.8 kg)   BMI 31.18 kg/m    Physical Exam Eyes:     Conjunctiva/sclera: Conjunctivae normal.  Cardiovascular:     Rate and Rhythm:  Normal rate and regular rhythm.  Pulmonary:     Effort: Pulmonary effort is normal.     Breath sounds: Normal breath sounds.  Musculoskeletal:     Right lower leg: No edema.     Left lower leg: No edema.  Lymphadenopathy:     Cervical: No cervical adenopathy.  Skin:    General: Skin is warm and dry.     Findings: No rash.     Comments: Bilateral hands longitudinal nail ridges no pitting or lesions  Neurological:     Mental Status: She is alert.  Psychiatric:        Mood and Affect: Mood normal.      Musculoskeletal Exam:  Shoulders full ROM no tenderness or swelling Elbows full ROM no tenderness or swelling Wrists full ROM no tenderness or swelling Fingers full ROM no tenderness or swelling Knees full ROM no tenderness or swelling  Investigation: No additional findings.  Imaging: No results found.  Recent Labs: Lab Results  Component Value Date   WBC 6.7 06/19/2022   HGB 13.5 06/19/2022   PLT 323 06/19/2022   NA 142 06/27/2022   K 4.4 06/27/2022   CL 103 06/27/2022   CO2 27 06/27/2022   GLUCOSE 86 06/27/2022   BUN 13 06/27/2022   CREATININE 0.84 06/27/2022   BILITOT 0.5 06/27/2022   ALKPHOS 88 06/27/2022   AST 27 06/27/2022   ALT 25 06/27/2022   PROT 7.8 06/27/2022   ALBUMIN 4.5 06/27/2022   CALCIUM 9.9 06/27/2022   GFRAA 77 12/07/2020   QFTBGOLDPLUS NEGATIVE 06/19/2022    Speciality Comments: No specialty comments available.  Procedures:  No procedures performed Allergies: Penicillins   Assessment / Plan:     Visit Diagnoses: Bilateral scleritis Bilateral chronic anterior uveitis - Plan: Adalimumab (HUMIRA, 2 PEN,) 40 MG/0.4ML PNKT  I inflammation appears to be staying well-controlled and no obvious new skin or joint inflammatory changes.  I believe she is having a bit of use related joint pain related to mild underlying osteoarthritis.  No obvious deformities or mobility limitation on exam.  Plan to continue the Humira 40 mg subcu q. 14 days  methotrexate 12.5 mg p.o. weekly and folic acid 2 mg p.o. daily.  High risk medication use  Discussed recent labs reviewed including CBC and CMP.  AST mildly  elevated to 57 I am not overly concerned about this but if there is an upward trend or stays elevated may need to taper or discontinue the methotrexate.  No serious interval infections.  Orders: No orders of the defined types were placed in this encounter.  Meds ordered this encounter  Medications   Adalimumab (HUMIRA, 2 PEN,) 40 MG/0.4ML PNKT    Sig: INJECT 40 MG UNDER THE SKIN EVERY 14 DAYS    Dispense:  4 each    Refill:  2     Follow-Up Instructions: Return in about 3 months (around 01/11/2023) for Uveitis on ADA/MTX f/u 59mos.   Collier Salina, MD  Note - This record has been created using [employer].  Chart creation errors have been sought, but may not always  have been located. Such creation errors do not reflect on  the standard of medical care.

## 2022-10-18 ENCOUNTER — Other Ambulatory Visit (INDEPENDENT_AMBULATORY_CARE_PROVIDER_SITE_OTHER): Payer: Self-pay | Admitting: Internal Medicine

## 2022-10-18 ENCOUNTER — Ambulatory Visit (INDEPENDENT_AMBULATORY_CARE_PROVIDER_SITE_OTHER): Payer: BC Managed Care – PPO | Admitting: Internal Medicine

## 2022-10-18 ENCOUNTER — Encounter (INDEPENDENT_AMBULATORY_CARE_PROVIDER_SITE_OTHER): Payer: Self-pay | Admitting: Internal Medicine

## 2022-10-18 VITALS — BP 130/84 | HR 84 | Temp 98.2°F | Ht 63.0 in | Wt 170.0 lb

## 2022-10-18 DIAGNOSIS — E559 Vitamin D deficiency, unspecified: Secondary | ICD-10-CM

## 2022-10-18 DIAGNOSIS — R7303 Prediabetes: Secondary | ICD-10-CM | POA: Diagnosis not present

## 2022-10-18 DIAGNOSIS — Z6830 Body mass index (BMI) 30.0-30.9, adult: Secondary | ICD-10-CM

## 2022-10-18 DIAGNOSIS — E66811 Obesity, class 1: Secondary | ICD-10-CM | POA: Insufficient documentation

## 2022-10-18 DIAGNOSIS — E668 Other obesity: Secondary | ICD-10-CM | POA: Diagnosis not present

## 2022-10-18 DIAGNOSIS — E669 Obesity, unspecified: Secondary | ICD-10-CM | POA: Insufficient documentation

## 2022-10-18 MED ORDER — METFORMIN HCL ER 500 MG PO TB24: 500.0000 mg | ORAL_TABLET | Freq: Two times a day (BID) | ORAL | 0 refills | Status: DC

## 2022-10-18 NOTE — Progress Notes (Signed)
Office: [phone]  /  Fax: [phone]  WEIGHT SUMMARY AND BIOMETRICS  Vitals Temp: 98.2 F (36.8 C) BP: 130/84 Pulse Rate: 84 SpO2: 99 %   Anthropometric Measurements Height: 5\' 3"  (1.6 m) Weight: 170 lb (77.1 kg) BMI (Calculated): 30.12 Weight at Last Visit: 171 lb Weight Lost Since Last Visit: 1 lb Starting Weight: 180 lb Total Weight Loss (lbs): 12 lb (5.443 kg) Peak Weight: 230 lb   Body Composition  Body Fat %: 33.4 % Fat Mass (lbs): 57 lbs Muscle Mass (lbs): 107.8 lbs Total Body Water (lbs): 70.8 lbs Visceral Fat Rating : 9    HPI  Chief Complaint: OBESITY  Kari Williams is here to discuss her progress with her obesity treatment plan. She is on the the Category 2 Plan and states she is following her eating plan approximately 90 % of the time. She states she is exercising 60 minutes 5 times per week.  Interval History:  Since last office visit she has lost 1 pound.  Which brings her to a total of 12 pounds.  She has been losing weight gradually but has not been feeling deprived. She reports good adherence to reduced calorie nutritional plan. She has been working on not skipping meals, increasing protein at every meal, drinking more water, journaling and tracking calories, and making healthier choices.  She reports staying close to 1200 cal a day and eating protein about 80 to 90 g. [x] Denies [] Reports problems with appetite and hunger signals.  [x] Denies [] Reports problems with satiety and satiation.  [x] Denies [] Reports problems with eating patterns and portion control.  [x] Denies [] Reports abnormal cravings   Barriers identified none.   Pharmacotherapy for weight loss: She is currently taking Metformin (off label use for incretin effect and / or insulin resistance and / or diabetes prevention) .    ASSESSMENT AND PLAN  TREATMENT PLAN FOR OBESITY:  Recommended Dietary Goals  [name] is currently in the action stage of change. As such, her goal is to  continue weight management plan. She has agreed to: continue current plan  Behavioral Intervention  We discussed the following Behavioral Modification Strategies today: increasing lean protein intake, increasing vegetables, increasing water intake, work on meal planning and easy cooking plans, work on tracking and journaling calories using tracking App, reading food labels , planning for success, and keeping healthy foods at home.  Additional resources provided today: None  Recommended Physical Activity Goals  [name] has been advised to work up to 150 minutes of moderate intensity aerobic activity a week and strengthening exercises 2-3 times per week for cardiovascular health, weight loss maintenance and preservation of muscle mass.   She has agreed to :  Continue current level of physical activity   Pharmacotherapy We discussed various medication options to help Shelle with her weight loss efforts and we both agreed to : continue current anti-obesity medication regimen  ASSOCIATED CONDITIONS ADDRESSED TODAY  Vitamin D deficiency Assessment & Plan: Most recent vitamin D levels  Lab Results  Component Value Date   VD25OH 11.4 (L) 06/27/2022     Deficiency state associated with adiposity and may result in leptin resistance, weight gain and fatigue. Currently on vitamin D supplementation without any adverse effects.  Plan: Check vitamin D levels today if levels have been replenished transition to over-the-counter supplementation.   Orders: -     VITAMIN D 25 Hydroxy (Vit-D Deficiency, Fractures)  Prediabetes Assessment & Plan: Most recent A1c is 6.0 at [employer] [facility] which is improved from previous  value of Lab Results  Component Value Date   HGBA1C 6.4 (H) 06/27/2022   She is currently on metformin twice a day without any adverse effects.  Continue with reduced calorie nutrition plan.  Continue metformin  Orders: -     metFORMIN HCl ER; Take 1 tablet (500 mg  total) by mouth 2 (two) times daily with a meal.  Dispense: 60 tablet; Refill: 0  Class 1 obesity with serious comorbidity and body mass index (BMI) of 30.0 to 30.9 in adult, unspecified obesity type      PHYSICAL EXAM:  Blood pressure 130/84, pulse 84, temperature 98.2 F (36.8 C), height 5\' 3"  (1.6 m), weight 170 lb (77.1 kg), SpO2 99 %. Body mass index is 30.11 kg/m.  General: She is overweight, cooperative, alert, well developed, and in no acute distress. PSYCH: Has normal mood, affect and thought process.   HEENT: EOMI, sclerae are anicteric. Lungs: Normal breathing effort, no conversational dyspnea. Extremities: No edema.  Neurologic: No gross sensory or motor deficits. No tremors or fasciculations noted.    DIAGNOSTIC DATA REVIEWED:  BMET    Component Value Date/Time   NA 142 06/27/2022 1007   K 4.4 06/27/2022 1007   CL 103 06/27/2022 1007   CO2 27 06/27/2022 1007   GLUCOSE 86 06/27/2022 1007   GLUCOSE 88 06/19/2022 1355   BUN 13 06/27/2022 1007   CREATININE 0.84 06/27/2022 1007   CREATININE 1.14 (H) 06/19/2022 1355   CALCIUM 9.9 06/27/2022 1007   GFRNONAA 66 12/07/2020 1518   GFRAA 77 12/07/2020 1518   Lab Results  Component Value Date   HGBA1C 6.4 (H) 06/27/2022   Lab Results  Component Value Date   INSULIN 23.1 06/27/2022   Lab Results  Component Value Date   TSH 1.070 06/27/2022   CBC    Component Value Date/Time   WBC 6.7 06/19/2022 1355   RBC 3.94 06/19/2022 1355   HGB 13.5 06/19/2022 1355   HCT 38.5 06/19/2022 1355   PLT 323 06/19/2022 1355   MCV 97.7 06/19/2022 1355   MCH 34.3 (H) 06/19/2022 1355   MCHC 35.1 06/19/2022 1355   RDW 12.7 06/19/2022 1355   Iron Studies No results found for: "IRON", "TIBC", "FERRITIN", "IRONPCTSAT" Lipid Panel     Component Value Date/Time   CHOL 234 (H) 06/27/2022 1007   TRIG 144 06/27/2022 1007   HDL 43 06/27/2022 1007   LDLCALC 165 (H) 06/27/2022 1007   Hepatic Function Panel     Component Value  Date/Time   PROT 7.8 06/27/2022 1007   ALBUMIN 4.5 06/27/2022 1007   AST 27 06/27/2022 1007   ALT 25 06/27/2022 1007   ALKPHOS 88 06/27/2022 1007   BILITOT 0.5 06/27/2022 1007      Component Value Date/Time   TSH 1.070 06/27/2022 1007   Nutritional Lab Results  Component Value Date   VD25OH 11.4 (L) 06/27/2022     Return in about 4 weeks (around 11/15/2022) for For Weight Mangement with Dr. Gerarda Fraction.Marland Kitchen She was informed of the importance of frequent follow up visits to maximize her success with intensive lifestyle modifications for her multiple health conditions.   ATTESTASTION STATEMENTS:  Reviewed by clinician on day of visit: allergies, medications, problem list, medical history, surgical history, family history, social history, and previous encounter notes.     Thomes Dinning, MD

## 2022-10-18 NOTE — Assessment & Plan Note (Signed)
Most recent A1c is 6.0 at [facility] which is improved from previous value of Lab Results  Component Value Date   HGBA1C 6.4 (H) 06/27/2022   She is currently on metformin twice a day without any adverse effects.  Continue with reduced calorie nutrition plan.  Continue metformin

## 2022-10-18 NOTE — Assessment & Plan Note (Signed)
Most recent vitamin D levels  Lab Results  Component Value Date   VD25OH 11.4 (L) 06/27/2022     Deficiency state associated with adiposity and may result in leptin resistance, weight gain and fatigue. Currently on vitamin D supplementation without any adverse effects.  Plan: Check vitamin D levels today if levels have been replenished transition to over-the-counter supplementation.

## 2022-10-19 LAB — VITAMIN D 25 HYDROXY (VIT D DEFICIENCY, FRACTURES): Vit D, 25-Hydroxy: 51.7 ng/mL (ref 30.0–100.0)

## 2022-11-08 ENCOUNTER — Ambulatory Visit (INDEPENDENT_AMBULATORY_CARE_PROVIDER_SITE_OTHER): Payer: BC Managed Care – PPO | Admitting: Internal Medicine

## 2022-11-08 ENCOUNTER — Other Ambulatory Visit (INDEPENDENT_AMBULATORY_CARE_PROVIDER_SITE_OTHER): Payer: Self-pay | Admitting: Internal Medicine

## 2022-11-08 DIAGNOSIS — R7303 Prediabetes: Secondary | ICD-10-CM

## 2022-11-14 ENCOUNTER — Encounter (INDEPENDENT_AMBULATORY_CARE_PROVIDER_SITE_OTHER): Payer: Self-pay

## 2022-11-14 ENCOUNTER — Ambulatory Visit (INDEPENDENT_AMBULATORY_CARE_PROVIDER_SITE_OTHER): Payer: BC Managed Care – PPO | Admitting: Internal Medicine

## 2022-11-15 DIAGNOSIS — R748 Abnormal levels of other serum enzymes: Secondary | ICD-10-CM | POA: Diagnosis not present

## 2022-11-20 ENCOUNTER — Encounter (INDEPENDENT_AMBULATORY_CARE_PROVIDER_SITE_OTHER): Payer: Self-pay | Admitting: Internal Medicine

## 2022-11-20 ENCOUNTER — Ambulatory Visit (INDEPENDENT_AMBULATORY_CARE_PROVIDER_SITE_OTHER): Payer: BC Managed Care – PPO | Admitting: Internal Medicine

## 2022-11-20 ENCOUNTER — Other Ambulatory Visit (INDEPENDENT_AMBULATORY_CARE_PROVIDER_SITE_OTHER): Payer: Self-pay | Admitting: Internal Medicine

## 2022-11-20 VITALS — BP 130/77 | HR 82 | Temp 97.8°F | Ht 63.0 in | Wt 171.0 lb

## 2022-11-20 DIAGNOSIS — E669 Obesity, unspecified: Secondary | ICD-10-CM | POA: Diagnosis not present

## 2022-11-20 DIAGNOSIS — R7303 Prediabetes: Secondary | ICD-10-CM

## 2022-11-20 DIAGNOSIS — Z6830 Body mass index (BMI) 30.0-30.9, adult: Secondary | ICD-10-CM

## 2022-11-20 MED ORDER — METFORMIN HCL ER 500 MG PO TB24: 500.0000 mg | ORAL_TABLET | Freq: Two times a day (BID) | ORAL | 0 refills | Status: DC

## 2022-11-20 NOTE — Progress Notes (Signed)
Office: [phone]  /  Fax: [phone]  WEIGHT SUMMARY AND BIOMETRICS  Vitals Temp: 97.8 F (36.6 C) BP: 130/77 Pulse Rate: 82 SpO2: 100 %   Anthropometric Measurements Height:  (1.6 m) Weight: 171 lb (77.6 kg) BMI (Calculated): 30.3 Weight at Last Visit: 170 lb Weight Gained Since Last Visit: 1 lb Starting Weight: 180 ln Total Weight Loss (lbs): 11 lb (4.99 kg) Peak Weight: 230 lb   Body Composition  Body Fat %: 32.9 % Fat Mass (lbs): 56.4 lbs Muscle Mass (lbs): 109.4 lbs Total Body Water (lbs): 71.6 lbs Visceral Fat Rating : 9    No data recorded Today's Visit #: 7  Starting Date: 06/27/22   HPI  Chief Complaint: OBESITY  Kari Williams is here to discuss her progress with her obesity treatment plan. She is on the the Category 2 Plan and states she is following her eating plan approximately 70 % of the time. She states she is exercising 45-50 minutes 7 times per week.  Interval History:  Since last office visit she has gained 1 lbs.  BIA shows a reduction in body fat percentage of 32% and an increase in muscle mass.  Her visceral fat rating is 9. She reports  fluctuating adherence at times.  She has not been tracking or journaling calories has been relying more on visual cues. She has been working on begun to exercise Denies problems with appetite and hunger signals.  Denies problems with satiety and satiation.  Denies problems with eating patterns and portion control.  Denies abnormal cravings. Denies feeling deprived or restricted.   Barriers identified: none.   Pharmacotherapy for weight loss: She is currently taking Metformin (off label use for incretin effect and / or insulin resistance and / or diabetes prevention) .    ASSESSMENT AND PLAN  TREATMENT PLAN FOR OBESITY:  Recommended Dietary Goals  Cherity is currently in the action stage of change. As such, her goal is to continue weight management plan. She has agreed to: keep a food  journal with a target of  1100 calories and 90 grams of protein  and continue current plan.  We revisited BMR and weight loss zone.  She is been working on lifestyle changes for about 3 years and may be experiencing some metabolic adaptations.  This was covered with her today.  Behavioral Intervention  We discussed the following Behavioral Modification Strategies today: increasing lean protein intake, decreasing simple carbohydrates , increasing vegetables, increasing lower glycemic fruits, increasing water intake, work on tracking and journaling calories using tracking application, reading food labels , identifying sources and decreasing liquid calories, continue to practice mindfulness when eating, and planning for success.  Additional resources provided today: None  Recommended Physical Activity Goals  Alvira has been advised to work up to 150 minutes of moderate intensity aerobic activity a week and strengthening exercises 2-3 times per week for cardiovascular health, weight loss maintenance and preservation of muscle mass.   She has agreed to :  Continue current level of physical activity   Pharmacotherapy We discussed various medication options to help Tashonda with her weight loss efforts and we both agreed to :  Continue metformin for incretin effect and diabetes prevention.  ASSOCIATED CONDITIONS ADDRESSED TODAY  Prediabetes Assessment & Plan: Most recent A1c is 6.0 at [facility] physician group which is improved from previous value of Lab Results  Component Value Date   HGBA1C 6.4 (H) 06/27/2022   She is currently on metformin twice a day without any  adverse effects.  Continue with reduced calorie nutrition plan low on simple sugars and processed carbs.  Orders: -     metFORMIN HCl ER; Take 1 tablet (500 mg total) by mouth 2 (two) times daily with a meal.  Dispense: 60 tablet; Refill: 0  Class 1 obesity with serious comorbidity and body mass index (BMI) of 30.0 to 30.9 in  adult, unspecified obesity type     PHYSICAL EXAM:  Blood pressure 130/77, pulse 82, temperature 97.8 F (36.6 C), height  (1.6 m), weight 171 lb (77.6 kg), SpO2 100 %. Body mass index is 30.29 kg/m.  General: She is overweight, cooperative, alert, well developed, and in no acute distress. PSYCH: Has normal mood, affect and thought process.   HEENT: EOMI, sclerae are anicteric. Lungs: Normal breathing effort, no conversational dyspnea. Extremities: No edema.  Neurologic: No gross sensory or motor deficits. No tremors or fasciculations noted.    DIAGNOSTIC DATA REVIEWED:  BMET    Component Value Date/Time   NA 142 06/27/2022 1007   K 4.4 06/27/2022 1007   CL 103 06/27/2022 1007   CO2 27 06/27/2022 1007   GLUCOSE 86 06/27/2022 1007   GLUCOSE 88 06/19/2022 1355   BUN 13 06/27/2022 1007   CREATININE 0.84 06/27/2022 1007   CREATININE 1.14 (H) 06/19/2022 1355   CALCIUM 9.9 06/27/2022 1007   GFRNONAA 66 12/07/2020 1518   GFRAA 77 12/07/2020 1518   Lab Results  Component Value Date   HGBA1C 6.4 (H) 06/27/2022   Lab Results  Component Value Date   INSULIN 23.1 06/27/2022   Lab Results  Component Value Date   TSH 1.070 06/27/2022   CBC    Component Value Date/Time   WBC 6.7 06/19/2022 1355   RBC 3.94 06/19/2022 1355   HGB 13.5 06/19/2022 1355   HCT 38.5 06/19/2022 1355   PLT 323 06/19/2022 1355   MCV 97.7 06/19/2022 1355   MCH 34.3 (H) 06/19/2022 1355   MCHC 35.1 06/19/2022 1355   RDW 12.7 06/19/2022 1355   Iron Studies No results found for: "IRON", "TIBC", "FERRITIN", "IRONPCTSAT" Lipid Panel     Component Value Date/Time   CHOL 234 (H) 06/27/2022 1007   TRIG 144 06/27/2022 1007   HDL 43 06/27/2022 1007   LDLCALC 165 (H) 06/27/2022 1007   Hepatic Function Panel     Component Value Date/Time   PROT 7.8 06/27/2022 1007   ALBUMIN 4.5 06/27/2022 1007   AST 27 06/27/2022 1007   ALT 25 06/27/2022 1007   ALKPHOS 88 06/27/2022 1007   BILITOT 0.5  06/27/2022 1007      Component Value Date/Time   TSH 1.070 06/27/2022 1007   Nutritional Lab Results  Component Value Date   VD25OH 51.7 10/18/2022   VD25OH 11.4 (L) 06/27/2022     Return in about 3 weeks (around 12/11/2022) for For Weight Mangement with Dr. Rikki Spearing.Marland Kitchen She was informed of the importance of frequent follow up visits to maximize her success with intensive lifestyle modifications for her multiple health conditions.   ATTESTASTION STATEMENTS:  Reviewed by clinician on day of visit: allergies, medications, problem list, medical history, surgical history, family history, social history, and previous encounter notes.     Worthy Rancher, MD

## 2022-11-20 NOTE — Assessment & Plan Note (Signed)
Most recent A1c is 6.0 at [facility] physician group which is improved from previous value of Lab Results  Component Value Date   HGBA1C 6.4 (H) 06/27/2022   She is currently on metformin twice a day without any adverse effects.  Continue with reduced calorie nutrition plan low on simple sugars and processed carbs.

## 2022-12-19 ENCOUNTER — Encounter (INDEPENDENT_AMBULATORY_CARE_PROVIDER_SITE_OTHER): Payer: Self-pay | Admitting: Internal Medicine

## 2022-12-19 ENCOUNTER — Ambulatory Visit (INDEPENDENT_AMBULATORY_CARE_PROVIDER_SITE_OTHER): Payer: BC Managed Care – PPO | Admitting: Internal Medicine

## 2022-12-19 VITALS — BP 134/82 | HR 88 | Temp 97.9°F | Ht 63.0 in | Wt 170.0 lb

## 2022-12-19 DIAGNOSIS — E668 Other obesity: Secondary | ICD-10-CM

## 2022-12-19 DIAGNOSIS — R7303 Prediabetes: Secondary | ICD-10-CM

## 2022-12-19 DIAGNOSIS — E669 Obesity, unspecified: Secondary | ICD-10-CM

## 2022-12-19 DIAGNOSIS — Z6830 Body mass index (BMI) 30.0-30.9, adult: Secondary | ICD-10-CM | POA: Diagnosis not present

## 2022-12-19 NOTE — Progress Notes (Signed)
Office: [phone]  /  Fax: [phone]  WEIGHT SUMMARY AND BIOMETRICS  Vitals Temp: 97.9 F (36.6 C) BP: 134/82 Pulse Rate: 88 SpO2: 99 %   Anthropometric Measurements Height: 5\' 3"  (1.6 m) Weight: 170 lb (77.1 kg) BMI (Calculated): 30.12 Weight at Last Visit: 171 lb Weight Lost Since Last Visit: 1 lb Weight Gained Since Last Visit: 0 Starting Weight: 180 lb Total Weight Loss (lbs): 10 lb (4.536 kg) Peak Weight: 230 lb   Body Composition  Body Fat %: 32.9 % Fat Mass (lbs): 56.2 lbs Muscle Mass (lbs): 108.8 lbs Total Body Water (lbs): 73.4 lbs Visceral Fat Rating : 9    No data recorded Today's Visit #: 8  Starting Date: 06/27/22   HPI  Chief Complaint: OBESITY  Kari Williams is here to discuss her progress with her obesity treatment plan. She is on the keeping a food journal and adhering to recommended goals of 1100 calories and 90+ protein and states she is following her eating plan approximately 90 % of the time. She states she is exercising walking her dog and yoga for 45-60 minutes 7 times per week.  Interval History:  Since last office visit she has [x]  lost []  maintained [] gained weight.  Adherence to nutrition plan :  []  Excellent [x]  Good []  Fair []  Suboptimal []  Variable []  Gradual implementation [] Has not started implementation  Nutritional: She has been working on reading food labels, not skipping meals, increasing protein intake at every meal, eating more fruits, eating more vegetables, drinking more water, journaling and tracking calories, making healthier choices, continues to exercise, and controlling orexigenic cues and stimuli  Orexigenic Control: [x] Denies [] Reports problems with appetite and hunger signals.  [x] Denies [] Reports problems with satiety and satiation.  [x] Denies [] Reports problems with eating patterns and portion control.  [x] Denies [] Reports strong cravings for highly palatable foods [x] Denies [] Reports problems with  feeling restricted or deprived  Stress levels: [x]  Low [] Medium [] High   Barriers identified: none.   Pharmacotherapy for weight loss: She is currently taking Metformin (off label use for incretin effect and / or insulin resistance and / or diabetes prevention) .    ASSESSMENT AND PLAN  TREATMENT PLAN FOR OBESITY:  Recommended Dietary Goals  Kari Williams is currently in the action stage of change. As such, her goal is to continue weight management plan. She has agreed to: continue current plan  Behavioral Intervention  We discussed the following Behavioral Modification Strategies today: increasing lean protein intake, decreasing simple carbohydrates , increasing vegetables, increasing lower glycemic fruits, increasing fiber rich foods, increasing water intake, work on tracking and journaling calories using tracking application, reading food labels , continue to practice mindfulness when eating, and planning for success.  Additional resources provided today:  She was provided a handout for dumbbell exercises  Recommended Physical Activity Goals  Kari Williams has been advised to work up to 150 minutes of moderate intensity aerobic activity a week and strengthening exercises 2-3 times per week for cardiovascular health, weight loss maintenance and preservation of muscle mass.   She has agreed to :  Start strengthening exercises with a goal of 2-3 sessions a week   Pharmacotherapy We have discussed various medication options to help Kari Williams with her weight loss efforts and we both agreed to : continue current anti-obesity medication regimen  ASSOCIATED CONDITIONS ADDRESSED TODAY  Prediabetes Assessment & Plan: Kari Williams recent A1c was 6.0 at [facility] physicians group and improved from 6.4.  She is currently on metformin 500 mg twice daily without  any adverse effects she will continue with pharmacoprophylaxis as well as nutritional and behavioral approaches to weight management.   Class 1 obesity  with serious comorbidity and body mass index (BMI) of 30.0 to 30.9 in adult, unspecified obesity type    PHYSICAL EXAM:  Blood pressure 134/82, pulse 88, temperature 97.9 F (36.6 C), height 5\' 3"  (1.6 m), weight 170 lb (77.1 kg), SpO2 99 %. Body mass index is 30.11 kg/m.  General: She is overweight, cooperative, alert, well developed, and in no acute distress. PSYCH: Has normal mood, affect and thought process.   HEENT: EOMI, sclerae are anicteric. Lungs: Normal breathing effort, no conversational dyspnea. Extremities: No edema.  Neurologic: No gross sensory or motor deficits. No tremors or fasciculations noted.    DIAGNOSTIC DATA REVIEWED:  BMET    Component Value Date/Time   NA 142 06/27/2022 1007   K 4.4 06/27/2022 1007   CL 103 06/27/2022 1007   CO2 27 06/27/2022 1007   GLUCOSE 86 06/27/2022 1007   GLUCOSE 88 06/19/2022 1355   BUN 13 06/27/2022 1007   CREATININE 0.84 06/27/2022 1007   CREATININE 1.14 (H) 06/19/2022 1355   CALCIUM 9.9 06/27/2022 1007   GFRNONAA 66 12/07/2020 1518   GFRAA 77 12/07/2020 1518   EGFR 78 06/27/2022 1007   Lab Results  Component Value Date   HGBA1C 6.4 (H) 06/27/2022   Lab Results  Component Value Date   INSULIN 23.1 06/27/2022   Lab Results  Component Value Date   TSH 1.070 06/27/2022   CBC    Component Value Date/Time   WBC 6.7 06/19/2022 1355   RBC 3.94 06/19/2022 1355   HGB 13.5 06/19/2022 1355   HCT 38.5 06/19/2022 1355   PLT 323 06/19/2022 1355   MCV 97.7 06/19/2022 1355   MCH 34.3 (H) 06/19/2022 1355   MCHC 35.1 06/19/2022 1355   RDW 12.7 06/19/2022 1355   Iron Studies No results found for: "IRON", "TIBC", "FERRITIN", "IRONPCTSAT" Lipid Panel     Component Value Date/Time   CHOL 234 (H) 06/27/2022 1007   TRIG 144 06/27/2022 1007   HDL 43 06/27/2022 1007   LDLCALC 165 (H) 06/27/2022 1007   Hepatic Function Panel     Component Value Date/Time   PROT 7.8 06/27/2022 1007   ALBUMIN 4.5 06/27/2022 1007   AST  27 06/27/2022 1007   ALT 25 06/27/2022 1007   ALKPHOS 88 06/27/2022 1007   BILITOT 0.5 06/27/2022 1007      Component Value Date/Time   TSH 1.070 06/27/2022 1007   Nutritional Lab Results  Component Value Date   VD25OH 51.7 10/18/2022   VD25OH 11.4 (L) 06/27/2022     Return in about 4 weeks (around 01/16/2023) for For Weight Mangement with Dr. Rikki Spearing.Marland Kitchen She was informed of the importance of frequent follow up visits to maximize her success with intensive lifestyle modifications for her multiple health conditions.   ATTESTASTION STATEMENTS:  Reviewed by clinician on day of visit: allergies, medications, problem list, medical history, surgical history, family history, social history, and previous encounter notes.     Worthy Rancher, MD

## 2022-12-19 NOTE — Assessment & Plan Note (Signed)
His recent A1c was 6.0 at [facility] physicians group and improved from 6.4.  She is currently on metformin 500 mg twice daily without any adverse effects she will continue with pharmacoprophylaxis as well as nutritional and behavioral approaches to weight management.

## 2022-12-21 DIAGNOSIS — M4722 Other spondylosis with radiculopathy, cervical region: Secondary | ICD-10-CM | POA: Diagnosis not present

## 2022-12-29 DIAGNOSIS — M542 Cervicalgia: Secondary | ICD-10-CM | POA: Diagnosis not present

## 2023-01-07 DIAGNOSIS — M5412 Radiculopathy, cervical region: Secondary | ICD-10-CM | POA: Diagnosis not present

## 2023-01-16 ENCOUNTER — Ambulatory Visit (INDEPENDENT_AMBULATORY_CARE_PROVIDER_SITE_OTHER): Payer: BC Managed Care – PPO | Admitting: Internal Medicine

## 2023-01-16 NOTE — Progress Notes (Deleted)
Office Visit Note  Patient: Kari Williams             Date of Birth: April 30, 1961           MRN: [id number]             PCP: Renford Dills, MD Referring: Renford Dills, MD Visit Date: 01/17/2023   Subjective:  No chief complaint on file.   History of Present Illness: Kari Williams is a 62 y.o. female here for follow up ***   Previous HPI 10/11/22 Kari Williams is a 62 y.o. female here for follow up follow-up for uveitis and scleritis on Humira 40 mg subcu q. 14 days methotrexate 12.5 mg p.o. weekly and folic acid 2 mg daily.  Overall she is doing pretty well since last visit still gets some knee pain without a lot of swelling.  Eye dryness and mild irritation no serious pain inflammation or visual acuity changes.  Most recent ophthalmology exam looked good.  She had recent labs obtained earlier this month with a normal complete blood count metabolic panel showed mild AST elevation to 57.     Previous HPI 06/19/22 Kari Williams is a 62 y.o. female here for follow up for uveitis and scleritis on Humira 40 mg [place] q14days and MTX 12.5 mg PO weekly and folic acid 2 mg daily. After last visit right knee pain improved and doing well now. She had follow up with Dr. Sherryll Burger eye exam negative for active uveitis although had some dryness and surface irritation. Getting restasis approved for this. She also has migraines which were a problem I the past but had been free of these for years until recently.    Previous HPI 03/15/22 Kari Williams is a 62 y.o. 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 She had a UTI in June treated with nitrofurantoin then switched to cefuroxime based on susceptibilities and symptoms resolved.  Overall she is doing well no symptomatic complaints with eye irritation or vision changes.  She has some right knee pain and stiffness does not severely limit her mobility.  Symptoms are most noticeable with certain movements such as descending  stairs.     Previous HPI 12/13/2021 Kari Williams is a 62 y.o. female here for follow up for uveiitis and scleritis on methotrexate 12.5 mg PO weekly and Humira [place] q14days. Since our last visit her finger pain is improved not needing any additional NSAIDs for symptom management. She has floaters but no new visual complaint no eye redness or pain.   Previous HPI 09/13/2021 Kari Williams is a 62 y.o. female here for follow up for uveiitis and scleritis on methotrexate 15 mg PO weekly and Humira [place] q14days. Last visit with Dr. Sherryll Burger 08/01/21. Eyes are doing okay without new complaints and exam was okay. She has noticed some increased pain and swelling affecting left hand fingers also mildly tender nodule swelling behind her left ear.   Previous HPI 06/14/21 Kari Williams is a 62 y.o. female here for follow up for uveitis and scleritis on methotrexate 15 mg PO weekly and Humira 40 mg  q14days. She has experienced small painless skin rashes in a few places including the left elbow these improved with conservative treatment moisturizing. She has left calf cramps at night ongoing no problems during the day or with mobility. Bilateral eye dryness is slightly worse than before during day time, using lubricating eye drops more than before. She has future appointment with  Dr. Sherryll Burger scheduled for next week.   Previous HPI 06/07/20 Kari Williams is a 62 y.o. 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 She has a previous history of treatment with methotrexate for iritis in 2010-2015 with Dr. Elmer Picker and Dr. Kellie Simmering. She had been off any treatment but visit with Dr. 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  Since earlier this year she has experienced eye redness watering and blurry vision.  She is more symptomatic complaint on the right than on the left currently but both sides bother her.  Besides the eye complaints  she does also report some fairly diffuse arthralgias and morning stiffness up to 30 minutes.  She has not noticed any specific joint swelling warmth or erythema.  In the past she has also been treated with oral or ocular steroids but has not received any for the current relapse of symptoms.   No Rheumatology ROS completed.   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   Nuclear sclerotic cataract of both eyes 10/13/2015   Epiretinal membrane (ERM) of right eye 10/13/2015   Lumbosacral radiculitis 09/01/2014   Routine gynecological examination 11/19/2011   Abdominal or pelvic swelling, mass, or lump, generalized 11/19/2011   Overweight (BMI 25.0-29.9) 11/19/2011   Fibroids 11/19/2011   HTN (hypertension) 11/19/2011   Condyloma 11/19/2011    Past Medical History:  Diagnosis Date   Allergy    seasonal   Anxiety    Arthritis    osteoarthritis/ddd   Chest pain    Depression    Female infertility    Hypertension    Rheumatoid arthritis (HCC)     Family History  Problem Relation Age of Onset   Dementia Mother    Hypotension Mother    Stroke Father    Hypertension Father    Sudden death Father    Breast cancer Sister    Hypertension Sister    Thyroid disease Sister    Colon cancer Brother    Sleep apnea Brother    Stroke Brother    Lupus Paternal Aunt    Lupus Niece    Past Surgical  History:  Procedure Laterality Date   ABDOMINAL HYSTERECTOMY     CARPAL TUNNEL RELEASE  07/30/2000   right   COLONOSCOPY     LUMBAR DISC SURGERY  07/30/2004   LUMBAR FUSION  07/30/2009   THORACIC DISC SURGERY  07/30/2000   TUBAL LIGATION  07/02/2012   Procedure: BILATERAL TUBAL LIGATION;  Surgeon: Kirkland Hun, MD;  Location: [facility];  Service: Gynecology;  Laterality: Bilateral;   UPPER GASTROINTESTINAL ENDOSCOPY     VAGINAL HYSTERECTOMY  07/02/2012   Procedure: HYSTERECTOMY VAGINAL;  Surgeon: Kirkland Hun, MD;  Location: [facility];  Service: Gynecology;  Laterality: N/A;   Social History   Social History Narrative   Not on  file   Immunization History  Administered Date(s) Administered   Influenza Split 07/03/2012   PFIZER(Purple Top)SARS-COV-2 Vaccination 10/13/2019, 11/03/2019     Objective: Vital Signs: There were no vitals taken for this visit.   Physical Exam   Musculoskeletal Exam: ***  CDAI Exam: CDAI Score: -- Patient Global: --; Provider Global: -- Swollen: --; Tender: -- Joint Exam 01/17/2023   No joint exam has been documented for this visit   There is currently no information documented on the homunculus. Go to the Rheumatology activity and complete the homunculus joint exam.  Investigation: No additional findings.  Imaging: No results found.  Recent Labs: Lab Results  Component Value Date   WBC 6.7 06/19/2022   HGB 13.5 06/19/2022   PLT 323 06/19/2022   NA 142 06/27/2022   K 4.4 06/27/2022   CL 103 06/27/2022   CO2 27 06/27/2022   GLUCOSE 86 06/27/2022   BUN 13 06/27/2022   CREATININE 0.84 06/27/2022   BILITOT 0.5 06/27/2022   ALKPHOS 88 06/27/2022   AST 27 06/27/2022   ALT 25 06/27/2022   PROT 7.8 06/27/2022   ALBUMIN 4.5 06/27/2022   CALCIUM 9.9 06/27/2022   GFRAA 77 12/07/2020   QFTBGOLDPLUS NEGATIVE 06/19/2022    Speciality Comments: No specialty comments available.  Procedures:  No procedures performed Allergies:  Penicillins   Assessment / Plan:     Visit Diagnoses: No diagnosis found.  ***  Orders: No orders of the defined types were placed in this encounter.  No orders of the defined types were placed in this encounter.    Follow-Up Instructions: No follow-ups on file.   Fuller Plan, MD  Note - This record has been created using [employer].  Chart creation errors have been sought, but may not always  have been located. Such creation errors do not reflect on  the standard of medical care.

## 2023-01-17 ENCOUNTER — Ambulatory Visit: Payer: BC Managed Care – PPO | Admitting: Internal Medicine

## 2023-01-21 ENCOUNTER — Ambulatory Visit (INDEPENDENT_AMBULATORY_CARE_PROVIDER_SITE_OTHER): Payer: BC Managed Care – PPO | Admitting: Internal Medicine

## 2023-01-21 ENCOUNTER — Encounter (INDEPENDENT_AMBULATORY_CARE_PROVIDER_SITE_OTHER): Payer: Self-pay | Admitting: Internal Medicine

## 2023-01-21 VITALS — BP 119/76 | HR 90 | Temp 98.4°F | Ht 63.0 in | Wt 173.0 lb

## 2023-01-21 DIAGNOSIS — Z6830 Body mass index (BMI) 30.0-30.9, adult: Secondary | ICD-10-CM

## 2023-01-21 DIAGNOSIS — R7303 Prediabetes: Secondary | ICD-10-CM

## 2023-01-21 DIAGNOSIS — E669 Obesity, unspecified: Secondary | ICD-10-CM | POA: Diagnosis not present

## 2023-01-21 MED ORDER — METFORMIN HCL ER 500 MG PO TB24: 500.0000 mg | ORAL_TABLET | Freq: Two times a day (BID) | ORAL | 0 refills | Status: DC

## 2023-01-21 NOTE — Assessment & Plan Note (Signed)
Kari Williams has a decent amount of muscle mass her body fat percentage is down to 33%.  This time around she gained 3 pounds due to decrease in protein intake and physical activity.  She continues to journal and maintaining a calorie deficit.  She will focus on increasing protein intake to about 30 to 40 g per meal and will try to do a meal replacement for lunch 3 to 4 days out of the week.  She will continue to track steps and shoot for 10,000 steps a day.  She will continue metformin for incretin effect.  No side effects reported

## 2023-01-21 NOTE — Assessment & Plan Note (Signed)
Her most recent A1c was 6.0 at [facility] physicians group and improved from 6.4.  She is currently on metformin 500 mg twice daily without any adverse effects she will continue with pharmacoprophylaxis as well as nutritional and behavioral approaches to weight management.  She maintains a low-carb diet in general without simple or added sugars

## 2023-01-21 NOTE — Progress Notes (Signed)
Office: [phone]  /  Fax: [phone]  WEIGHT SUMMARY AND BIOMETRICS  Vitals Temp: 98.4 F (36.9 C) BP: 119/76 Pulse Rate: 90 SpO2: 99 %   Anthropometric Measurements Height: 5\' 3"  (1.6 m) Weight: 173 lb (78.5 kg) BMI (Calculated): 30.65 Weight at Last Visit: 170 lb Weight Gained Since Last Visit: 3 lb Starting Weight: 180 lb Total Weight Loss (lbs): 7 lb (3.175 kg) Peak Weight: 230 lb   Body Composition  Body Fat %: 33.6 % Fat Mass (lbs): 58.4 lbs Muscle Mass (lbs): 109.6 lbs Total Body Water (lbs): 74.2 lbs Visceral Fat Rating : 9    No data recorded Today's Visit #: 9  Starting Date: 06/27/22   HPI  Chief Complaint: OBESITY  Kari Williams is here to discuss her progress with her obesity treatment plan. She is on the the Category 2 Plan and states she is following her eating plan approximately 90 % of the time. She states she is exercising 60 minutes 7 times per week.  Interval History:  Since last office visit she has gained 3 lbs. Has not been able to exercise due neck pain and received a course of steroids.  She reports good adherence to reduced calorie nutritional plan. She has been working on reading food labels, not skipping meals, drinking more water, avoiding and or reducing liquid calories, journaling and tracking calories, making healthier choices, and controlling orexigenic cues and stimuli 24 hour: cup of coffee, fittatas, [place] sausage, lettuce wrap with [place] and cheese. 1/2 apple, dinner salad.  Orixegenic Control: Denies problems with appetite and hunger signals.  Denies problems with satiety and satiation.  Denies problems with eating patterns and portion control.  Denies abnormal cravings. Denies feeling deprived or restricted.   Barriers identified: orthopedic problems, medical conditions or chronic pain affecting mobility.   Pharmacotherapy for weight loss: She is currently taking Metformin (off label use for incretin effect and /  or insulin resistance and / or diabetes prevention) with adequate clinical response  and without side effects..    ASSESSMENT AND PLAN  TREATMENT PLAN FOR OBESITY:  Recommended Dietary Goals  Kari Williams is currently in the action stage of change. As such, her goal is to continue weight management plan. She has agreed to: continue current plan + add a meal replacement drink for lunch.  Behavioral Intervention  We discussed the following Behavioral Modification Strategies today: increasing lean protein intake, decreasing simple carbohydrates , increasing vegetables, increasing lower glycemic fruits, increasing fiber rich foods, increasing water intake, continue to practice mindfulness when eating, and planning for success.  Additional resources provided today:  Protein content of foods  Recommended Physical Activity Goals  Kari Williams has been advised to work up to 150 minutes of moderate intensity aerobic activity a week and strengthening exercises 2-3 times per week for cardiovascular health, weight loss maintenance and preservation of muscle mass.   She has agreed to :  Increase the intensity, frequency or duration of aerobic exercises   and she is unable to do strengthening due to having neck problems radiating to her right upper extremity.  This is being evaluated by orthopedics.  Pharmacotherapy We discussed various medication options to help Kari Williams with her weight loss efforts and we both agreed to :  Continue metformin with the primary indication of diabetes prevention  ASSOCIATED CONDITIONS ADDRESSED TODAY  Prediabetes Assessment & Plan: Her most recent A1c was 6.0 at [facility] and improved from 6.4.  She is currently on metformin 500 mg twice daily without  any adverse effects she will continue with pharmacoprophylaxis as well as nutritional and behavioral approaches to weight management.  She maintains a low-carb diet in general without simple or added  sugars  Orders: -     metFORMIN HCl ER; Take 1 tablet (500 mg total) by mouth 2 (two) times daily with a meal.  Dispense: 180 tablet; Refill: 0  Obesity- Start BMI 31.92 Assessment & Plan: Kari Williams has a decent amount of muscle mass her body fat percentage is down to 33%.  This time around she gained 3 pounds due to decrease in protein intake and physical activity.  She continues to journal and maintaining a calorie deficit.  She will focus on increasing protein intake to about 30 to 40 g per meal and will try to do a meal replacement for lunch 3 to 4 days out of the week.  She will continue to track steps and shoot for 10,000 steps a day.  She will continue metformin for incretin effect.  No side effects reported     PHYSICAL EXAM:  Blood pressure 119/76, pulse 90, temperature 98.4 F (36.9 C), height 5\' 3"  (1.6 m), weight 173 lb (78.5 kg), SpO2 99 %. Body mass index is 30.65 kg/m.  General: She is overweight, cooperative, alert, well developed, and in no acute distress. PSYCH: Has normal mood, affect and thought process.   HEENT: EOMI, sclerae are anicteric. Lungs: Normal breathing effort, no conversational dyspnea. Extremities: No edema.  Neurologic: No gross sensory or motor deficits. No tremors or fasciculations noted.    DIAGNOSTIC DATA REVIEWED:  BMET    Component Value Date/Time   NA 142 06/27/2022 1007   K 4.4 06/27/2022 1007   CL 103 06/27/2022 1007   CO2 27 06/27/2022 1007   GLUCOSE 86 06/27/2022 1007   GLUCOSE 88 06/19/2022 1355   BUN 13 06/27/2022 1007   CREATININE 0.84 06/27/2022 1007   CREATININE 1.14 (H) 06/19/2022 1355   CALCIUM 9.9 06/27/2022 1007   GFRNONAA 66 12/07/2020 1518   GFRAA 77 12/07/2020 1518   Lab Results  Component Value Date   HGBA1C 6.4 (H) 06/27/2022   Lab Results  Component Value Date   INSULIN 23.1 06/27/2022   Lab Results  Component Value Date   TSH 1.070 06/27/2022   CBC    Component Value Date/Time   WBC 6.7 06/19/2022  1355   RBC 3.94 06/19/2022 1355   HGB 13.5 06/19/2022 1355   HCT 38.5 06/19/2022 1355   PLT 323 06/19/2022 1355   MCV 97.7 06/19/2022 1355   MCH 34.3 (H) 06/19/2022 1355   MCHC 35.1 06/19/2022 1355   RDW 12.7 06/19/2022 1355   Iron Studies No results found for: "IRON", "TIBC", "FERRITIN", "IRONPCTSAT" Lipid Panel     Component Value Date/Time   CHOL 234 (H) 06/27/2022 1007   TRIG 144 06/27/2022 1007   HDL 43 06/27/2022 1007   LDLCALC 165 (H) 06/27/2022 1007   Hepatic Function Panel     Component Value Date/Time   PROT 7.8 06/27/2022 1007   ALBUMIN 4.5 06/27/2022 1007   AST 27 06/27/2022 1007   ALT 25 06/27/2022 1007   ALKPHOS 88 06/27/2022 1007   BILITOT 0.5 06/27/2022 1007      Component Value Date/Time   TSH 1.070 06/27/2022 1007   Nutritional Lab Results  Component Value Date   VD25OH 51.7 10/18/2022   VD25OH 11.4 (L) 06/27/2022     Return in about 4 weeks (around 02/18/2023) for For Weight Mangement with  Dr. Rikki Spearing.Marland Kitchen She was informed of the importance of frequent follow up visits to maximize her success with intensive lifestyle modifications for her multiple health conditions.   ATTESTASTION STATEMENTS:  Reviewed by clinician on day of visit: allergies, medications, problem list, medical history, surgical history, family history, social history, and previous encounter notes.     Worthy Rancher, MD

## 2023-01-24 ENCOUNTER — Ambulatory Visit: Payer: BC Managed Care – PPO | Attending: Internal Medicine | Admitting: Internal Medicine

## 2023-01-24 ENCOUNTER — Encounter: Payer: Self-pay | Admitting: Internal Medicine

## 2023-01-24 VITALS — BP 128/74 | HR 90 | Resp 14 | Ht 63.0 in | Wt 179.0 lb

## 2023-01-24 DIAGNOSIS — M67951 Unspecified disorder of synovium and tendon, right thigh: Secondary | ICD-10-CM | POA: Diagnosis not present

## 2023-01-24 DIAGNOSIS — H2013 Chronic iridocyclitis, bilateral: Secondary | ICD-10-CM | POA: Diagnosis not present

## 2023-01-24 DIAGNOSIS — H15003 Unspecified scleritis, bilateral: Secondary | ICD-10-CM

## 2023-01-24 DIAGNOSIS — Z79899 Other long term (current) drug therapy: Secondary | ICD-10-CM

## 2023-01-24 LAB — CBC WITH DIFFERENTIAL/PLATELET
Eosinophils Absolute: 83 cells/uL (ref 15–500)
Eosinophils Relative: 1.1 %
HCT: 39.1 % (ref 35.0–45.0)
Hemoglobin: 13 g/dL (ref 11.7–15.5)
MCH: 33.1 pg — ABNORMAL HIGH (ref 27.0–33.0)
MCV: 99.5 fL (ref 80.0–100.0)
MPV: 9.3 fL (ref 7.5–12.5)
Platelets: 371 10*3/uL (ref 140–400)
RBC: 3.93 10*6/uL (ref 3.80–5.10)
Total Lymphocyte: 37 %

## 2023-01-24 NOTE — Progress Notes (Signed)
Office Visit Note  Patient: Kari Williams             Date of Birth: 1961-05-23           MRN: [id number]             PCP: Renford Dills, MD Referring: Renford Dills, MD Visit Date: 01/24/2023   Subjective:  Follow-up (Patient states she has some pain in her right hip that here and there. Patient states she has a bulging disk in her neck. )   History of Present Illness: Kari Williams is a 62 y.o. 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 14 days and methotrexate 12.5 mg p.o. weekly and folic acid 2 mg daily.  Most recent liver enzyme tests from last visit were elevated but mildly so I will trend for monitoring methotrexate.  No new eye pain or visual change complaints.  Has been having intermittent pain at the right hip comes and goes.  Often gets worse moving after sitting in a prolonged fixed position or direct pressure such as lying on the right side.  Also having pain in the neck had an MRI for this that demonstrated a bulging disc.  She is scheduled for steroid injection next Friday for this.   Previous HPI 10/11/22 Kari Williams is a 62 y.o. female here for follow up follow-up for uveitis and scleritis on Humira 40 mg subcu q. 14 days methotrexate 12.5 mg p.o. weekly and folic acid 2 mg daily.  Overall she is doing pretty well since last visit still gets some knee pain without a lot of swelling.  Eye dryness and mild irritation no serious pain inflammation or visual acuity changes.  Most recent ophthalmology exam looked good.  She had recent labs obtained earlier this month with a normal complete blood count metabolic panel showed mild AST elevation to 57.     Previous HPI 06/19/22 Kari Williams is a 61 y.o. female here for follow up for uveitis and scleritis on Humira 40 mg [place] q14days and MTX 12.5 mg PO weekly and folic acid 2 mg daily. After last visit right knee pain improved and doing well now. She had follow up with Dr.  Sherryll Burger eye exam negative for active uveitis although had some dryness and surface irritation. Getting restasis approved for this. She also has migraines which were a problem I the past but had been free of these for years until recently.    Previous HPI 03/15/22 Kari Williams is a 62 y.o. 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 She had a UTI in June treated with nitrofurantoin then switched to cefuroxime based on susceptibilities and symptoms resolved.  Overall she is doing well no symptomatic complaints with eye irritation or vision changes.  She has some right knee pain and stiffness does not severely limit her mobility.  Symptoms are most noticeable with certain movements such as descending stairs.     Previous HPI 12/13/2021 Kari Williams is a 62 y.o. female here for follow up for uveiitis and scleritis on methotrexate 12.5 mg PO weekly and Humira [place] q14days. Since our last visit her finger pain is improved not needing any additional NSAIDs for symptom management. She has floaters but no new visual complaint no eye redness or pain.   Previous HPI 09/13/2021 Kari Williams is a 62 y.o. female here for follow up for uveiitis and scleritis on methotrexate 15 mg PO  weekly and Humira [place] q14days. Last visit with Dr. Sherryll Burger 08/01/21. Eyes are doing okay without new complaints and exam was okay. She has noticed some increased pain and swelling affecting left hand fingers also mildly tender nodule swelling behind her left ear.   Previous HPI 06/14/21 Kari Williams is a 62 y.o. female here for follow up for uveitis and scleritis on methotrexate 15 mg PO weekly and Humira 40 mg [place] q14days. She has experienced small painless skin rashes in a few places including the left elbow these improved with conservative treatment moisturizing. She has left calf cramps at night ongoing no problems during the day or with mobility. Bilateral eye dryness is slightly worse than before during day  time, using lubricating eye drops more than before. She has future appointment with Dr. Sherryll Burger scheduled for next week.   Previous HPI 06/07/20 Kari Williams is a 62 y.o. 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 She has a previous history of treatment with methotrexate for iritis in 2010-2015 with Dr. Elmer Picker and Dr. Kellie Simmering. She had been off any treatment but visit with Dr. 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  Since earlier this year she has experienced eye redness watering and blurry vision.  She is more symptomatic complaint on the right than on the left currently but both sides bother her.  Besides the eye complaints she does also report some fairly diffuse arthralgias and morning stiffness up to 30 minutes.  She has not noticed any specific joint swelling warmth or erythema.  In the past she has also been treated with oral or ocular steroids but has not received any for the current relapse of symptoms.   Review of Systems  Constitutional:  Positive for fatigue.  HENT:  Negative for mouth sores and mouth dryness.   Eyes:  Positive for dryness.  Respiratory:  Negative for shortness of breath.   Cardiovascular:  Negative for chest pain and palpitations.  Gastrointestinal:  Negative for blood in stool, constipation and diarrhea.  Endocrine: Negative for increased urination.  Genitourinary:  Positive for involuntary urination.  Musculoskeletal:  Positive for joint pain and joint pain. Negative for gait problem, joint swelling, myalgias, muscle weakness, morning stiffness, muscle tenderness and myalgias.  Skin:  Negative for color change, rash, hair loss and sensitivity to sunlight.  Allergic/Immunologic: Negative for susceptible to infections.  Neurological:  Positive for dizziness and headaches.  Hematological:  Negative for swollen glands.  Psychiatric/Behavioral:  Positive for depressed mood and sleep  disturbance. The patient is nervous/anxious.     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   High risk medication use 06/07/2020   Carpal tunnel syndrome 11/29/2015   Hypertensive retinopathy of both eyes 11/01/2015   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   Nuclear sclerotic cataract of both eyes 10/13/2015   Epiretinal membrane (ERM) of right eye 10/13/2015   Lumbosacral radiculitis 09/01/2014  Routine gynecological examination 11/19/2011   Abdominal or pelvic swelling, mass, or lump, generalized 11/19/2011   Overweight (BMI 25.0-29.9) 11/19/2011   Fibroids 11/19/2011   HTN (hypertension) 11/19/2011   Condyloma 11/19/2011    Past Medical History:  Diagnosis Date   Allergy    seasonal   Anxiety    Arthritis    osteoarthritis/ddd   Bulging of cervical intervertebral disc    Chest pain    Depression    Female infertility    Hypertension    Rheumatoid arthritis (HCC)     Family History  Problem Relation Age of Onset   Dementia Mother    Hypotension Mother    Stroke Father    Hypertension Father    Sudden death Father    Breast cancer Sister    Hypertension Sister    Thyroid disease Sister    Colon cancer Brother    Sleep apnea Brother    Stroke Brother    Lupus Paternal Aunt    Lupus Niece    Past Surgical History:  Procedure Laterality Date   ABDOMINAL HYSTERECTOMY     CARPAL TUNNEL RELEASE  07/30/2000   right   COLONOSCOPY     LUMBAR DISC SURGERY   07/30/2004   LUMBAR FUSION  07/30/2009   THORACIC DISC SURGERY  07/30/2000   TUBAL LIGATION  07/02/2012   Procedure: BILATERAL TUBAL LIGATION;  Surgeon: Kirkland Hun, MD;  Location: [facility];  Service: Gynecology;  Laterality: Bilateral;   UPPER GASTROINTESTINAL ENDOSCOPY     VAGINAL HYSTERECTOMY  07/02/2012   Procedure: HYSTERECTOMY VAGINAL;  Surgeon: Kirkland Hun, MD;  Location: [facility];  Service: Gynecology;  Laterality: N/A;   Social History   Social History Narrative   Not on file   Immunization History  Administered Date(s) Administered   Influenza Split 07/03/2012   PFIZER(Purple Top)SARS-COV-2 Vaccination 10/13/2019, 11/03/2019     Objective: Vital Signs: BP 128/74 (BP Location: Right Arm, Patient Position: Sitting, Cuff Size: Normal)   Pulse 90   Resp 14   Ht 5\' 3"  (1.6 m)   Wt 179 lb (81.2 kg)   BMI 31.71 kg/m    Physical Exam Cardiovascular:     Rate and Rhythm: Normal rate and regular rhythm.  Pulmonary:     Effort: Pulmonary effort is normal.     Breath sounds: Normal breath sounds.  Musculoskeletal:     Right lower leg: No edema.     Left lower leg: No edema.  Skin:    General: Skin is warm and dry.     Findings: No rash.  Neurological:     Mental Status: She is alert.  Psychiatric:        Mood and Affect: Mood normal.      Musculoskeletal Exam:  Shoulders full ROM no tenderness or swelling Elbows full ROM no tenderness or swelling Wrists full ROM no tenderness or swelling Fingers full ROM no tenderness or swelling Mild right hip lateral tenderness to pressure with no radiation, normal internal/external rotation Knees full ROM no tenderness or swelling  Investigation: No additional findings.  Imaging: No results found.  Recent Labs: Lab Results  Component Value Date   WBC 7.5 01/24/2023   HGB 13.0 01/24/2023   PLT 371 01/24/2023   NA 142 01/24/2023   K 4.7 01/24/2023   CL 106 01/24/2023   CO2 25 01/24/2023   GLUCOSE 53 (L)  01/24/2023   BUN 11 01/24/2023   CREATININE 0.77 01/24/2023   BILITOT 0.4 01/24/2023  ALKPHOS 88 06/27/2022   AST 17 01/24/2023   ALT 16 01/24/2023   PROT 7.1 01/24/2023   ALBUMIN 4.5 06/27/2022   CALCIUM 9.8 01/24/2023   GFRAA 77 12/07/2020   QFTBGOLDPLUS NEGATIVE 06/19/2022    Speciality Comments: No specialty comments available.  Procedures:  No procedures performed Allergies: Penicillins   Assessment / Plan:     Visit Diagnoses: Bilateral scleritis - Plan: Sedimentation rate Bilateral chronic anterior uveitis  Eye inflammation appears to be controlled no new symptomatic complaints and most recent eye exam was quiescent.  Has ongoing follow-up with Dr. Sherryll Burger.  Rechecking sedimentation rate for disease activity monitoring.  Plan is to continue Humira 40 mg subcu q. 14 days and methotrexate 12.5 mg p.o. weekly and folic acid 2 mg daily.  High risk medication use - Plan: CBC with Differential/Platelet, COMPLETE METABOLIC PANEL WITH GFR  Checking CBC and CMP for medication monitoring on Humira and methotrexate.  No serious interval infections.  Had abnormal liver enzyme test mildly elevated last time we are rechecking this if these are equal or worse would hold methotrexate treatment.  Tendinopathy of right gluteus medius  Right hip pain is most consistent with greater trochanteric pain syndrome.  Location provocative symptoms may be indicative for gluteal muscle tendinopathy.  Recommend initial treatment with at home range of motion exercises for this.  If failing to improve could be candidate for trial of muscle relaxer or local steroid injection or formal physical therapy.  With the planned upcoming injection for her bulging cervical disc that is much higher priority so holding off any invasive treatment at this time.  Orders: Orders Placed This Encounter  Procedures   Sedimentation rate   CBC with Differential/Platelet   COMPLETE METABOLIC PANEL WITH GFR   No orders of the  defined types were placed in this encounter.    Follow-Up Instructions: Return in about 3 months (around 04/26/2023) for Scleritis on ADA/MTX f/u 3mos.   Fuller Plan, MD  Note - This record has been created using [employer].  Chart creation errors have been sought, but may not always  have been located. Such creation errors do not reflect on  the standard of medical care.

## 2023-01-25 ENCOUNTER — Other Ambulatory Visit: Payer: Self-pay | Admitting: Internal Medicine

## 2023-01-25 LAB — CBC WITH DIFFERENTIAL/PLATELET
Absolute Monocytes: 938 cells/uL (ref 200–950)
Basophils Absolute: 53 cells/uL (ref 0–200)
Basophils Relative: 0.7 %
Lymphs Abs: 2775 cells/uL (ref 850–3900)
MCHC: 33.2 g/dL (ref 32.0–36.0)
Monocytes Relative: 12.5 %
Neutro Abs: 3653 cells/uL (ref 1500–7800)
Neutrophils Relative %: 48.7 %
RDW: 12.9 % (ref 11.0–15.0)
WBC: 7.5 10*3/uL (ref 3.8–10.8)

## 2023-01-25 LAB — COMPLETE METABOLIC PANEL WITHOUT GFR
AG Ratio: 1.7 (calc) (ref 1.0–2.5)
ALT: 16 U/L (ref 6–29)
AST: 17 U/L (ref 10–35)
Albumin: 4.5 g/dL (ref 3.6–5.1)
Alkaline phosphatase (APISO): 61 U/L (ref 37–153)
BUN: 11 mg/dL (ref 7–25)
CO2: 25 mmol/L (ref 20–32)
Calcium: 9.8 mg/dL (ref 8.6–10.4)
Chloride: 106 mmol/L (ref 98–110)
Creat: 0.77 mg/dL (ref 0.50–1.05)
Globulin: 2.6 g/dL (ref 1.9–3.7)
Glucose, Bld: 53 mg/dL — ABNORMAL LOW (ref 65–99)
Potassium: 4.7 mmol/L (ref 3.5–5.3)
Sodium: 142 mmol/L (ref 135–146)
Total Bilirubin: 0.4 mg/dL (ref 0.2–1.2)
Total Protein: 7.1 g/dL (ref 6.1–8.1)
eGFR: 86 mL/min/1.73m2

## 2023-01-25 LAB — SEDIMENTATION RATE: Sed Rate: 9 mm/h (ref 0–30)

## 2023-01-25 NOTE — Progress Notes (Signed)
Her lab results got better the liver enzyme tests are completely normal now. Sedimentation rate also remains normal. So there is no need to change medications.

## 2023-01-25 NOTE — Telephone Encounter (Signed)
Last Fill: 06/22/2022  Next Visit: 04/26/2023  Last Visit: 01/25/2023  Dx: not mentioned  Current Dose per office note on 01/24/2023: not mentioned  Okay to refill Folic Acid?

## 2023-02-01 DIAGNOSIS — M5412 Radiculopathy, cervical region: Secondary | ICD-10-CM | POA: Diagnosis not present

## 2023-02-11 DIAGNOSIS — Z01419 Encounter for gynecological examination (general) (routine) without abnormal findings: Secondary | ICD-10-CM | POA: Diagnosis not present

## 2023-02-11 DIAGNOSIS — Z1231 Encounter for screening mammogram for malignant neoplasm of breast: Secondary | ICD-10-CM | POA: Diagnosis not present

## 2023-02-15 DIAGNOSIS — M542 Cervicalgia: Secondary | ICD-10-CM | POA: Diagnosis not present

## 2023-02-20 ENCOUNTER — Ambulatory Visit (INDEPENDENT_AMBULATORY_CARE_PROVIDER_SITE_OTHER): Payer: BC Managed Care – PPO | Admitting: Internal Medicine

## 2023-03-19 ENCOUNTER — Encounter (INDEPENDENT_AMBULATORY_CARE_PROVIDER_SITE_OTHER): Payer: Self-pay | Admitting: Internal Medicine

## 2023-03-19 ENCOUNTER — Ambulatory Visit (INDEPENDENT_AMBULATORY_CARE_PROVIDER_SITE_OTHER): Payer: BC Managed Care – PPO | Admitting: Internal Medicine

## 2023-03-19 VITALS — BP 121/74 | HR 81 | Temp 98.2°F | Ht 63.0 in | Wt 178.0 lb

## 2023-03-19 DIAGNOSIS — Z6831 Body mass index (BMI) 31.0-31.9, adult: Secondary | ICD-10-CM | POA: Diagnosis not present

## 2023-03-19 DIAGNOSIS — R7303 Prediabetes: Secondary | ICD-10-CM | POA: Diagnosis not present

## 2023-03-19 DIAGNOSIS — T50905A Adverse effect of unspecified drugs, medicaments and biological substances, initial encounter: Secondary | ICD-10-CM | POA: Insufficient documentation

## 2023-03-19 DIAGNOSIS — E668 Other obesity: Secondary | ICD-10-CM | POA: Diagnosis not present

## 2023-03-19 DIAGNOSIS — E669 Obesity, unspecified: Secondary | ICD-10-CM

## 2023-03-19 NOTE — Assessment & Plan Note (Signed)
Kari Williams has been gaining weight.  Her BIA suggest an increase in muscle mass but also fat mass.  She had been tracking and journaling in the past and this has gone down.  There has also been changes in volume of physical activity with preference over aerobic versus strengthening.  She has a decent amount of muscle mass so I think she derives significant benefit from strengthening exercises as this will help boost her metabolism.  I think she is already doing a good job maintaining a reduced calorie state.  She is also on medications that could contribute to weight gain including Humira and duloxetine.  These medications are necessary for her.

## 2023-03-19 NOTE — Progress Notes (Signed)
Office: [phone]  /  Fax: [phone]  WEIGHT SUMMARY AND BIOMETRICS  Vitals Temp: 98.2 F (36.8 C) BP: 121/74 Pulse Rate: 81 SpO2: 100 %   Anthropometric Measurements Height: 5\' 3"  (1.6 m) Weight: 178 lb (80.7 kg) BMI (Calculated): 31.54 Weight at Last Visit: 173 lb Weight Lost Since Last Visit: 0 lb Weight Gained Since Last Visit: 5 lb Starting Weight: 180 lb Total Weight Loss (lbs): 2 lb (0.907 kg) Peak Weight: 230 lb   Body Composition  Body Fat %: 34.8 % Fat Mass (lbs): 62.2 lbs Muscle Mass (lbs): 110.6 lbs Total Body Water (lbs): 76.8 lbs Visceral Fat Rating : 10    No data recorded Today's Visit #: 10  Starting Date: 06/27/22  Trainer Focus  HPI  Chief Complaint: OBESITY  Kari Williams is here to discuss her progress with her obesity treatment plan. She is on the the Category 2 Plan and states she is following her eating plan approximately 80-90 % of the time. She states she is exercising 60 minutes 4 times per week.  Interval History:  Since last office visit she has gained 5 lbs. Started unjury, has not been doing as much strengthening. Denies snacking. Notes increased stress levels. Denies stress eating.  She reports good adherence to reduced calorie nutritional plan. She has been working on not skipping meals, increasing protein intake at every meal, eating more fruits, eating more vegetables, drinking more water, and eating out less  Orixegenic Control: Denies problems with appetite and hunger signals.  Denies problems with satiety and satiation.  Denies problems with eating patterns and portion control.  Denies abnormal cravings. Denies feeling deprived or restricted.   Barriers identified: moderate to high levels of stress and menopausal status .   Pharmacotherapy for weight loss: She is currently taking Metformin (off label use for incretin effect and / or insulin resistance and / or diabetes prevention) with adequate clinical response   and without side effects..    ASSESSMENT AND PLAN  TREATMENT PLAN FOR OBESITY:  Recommended Dietary Goals  Kari Williams is currently in the action stage of change. As such, her goal is to continue weight management plan. She has agreed to: continue current plan  Behavioral Intervention  We discussed the following Behavioral Modification Strategies today: increasing lean protein intake, decreasing simple carbohydrates , increasing vegetables, increasing lower glycemic fruits, increasing water intake, continue to practice mindfulness when eating, and planning for success.  Additional resources provided today: None  Recommended Physical Activity Goals  Kari Williams has been advised to work up to 150 minutes of moderate intensity aerobic activity a week and strengthening exercises 2-3 times per week for cardiovascular health, weight loss maintenance and preservation of muscle mass.   She has agreed to :  Think about ways to increase daily physical activity and overcoming barriers to exercise and Start strengthening exercises with a goal of 2-3 sessions a week   Pharmacotherapy We discussed various medication options to help Kari Williams with her weight loss efforts and we both agreed to : continue current anti-obesity medication regimen.  She declines antiobesity medication  ASSOCIATED CONDITIONS ADDRESSED TODAY  Class 1 obesity with serious comorbidity and body mass index (BMI) of 30.0 to 30.9 in adult, unspecified obesity type Assessment & Plan: Kari Williams has been gaining weight.  Her BIA suggest an increase in muscle mass but also fat mass.  She had been tracking and journaling in the past and this has gone down.  There has also been changes in volume of physical  activity with preference over aerobic versus strengthening.  She has a decent amount of muscle mass so I think she derives significant benefit from strengthening exercises as this will help boost her metabolism.  I think she is already doing a  good job maintaining a reduced calorie state.  She is also on medications that could contribute to weight gain including Humira and duloxetine.  These medications are necessary for her.   Prediabetes Assessment & Plan: Her most recent A1c was 6.0 at [facility] and improved from 6.4.  She is currently on metformin 500 mg twice daily without any adverse effects she will continue with pharmacoprophylaxis as well as nutritional and behavioral approaches to weight management.  She maintains a low-carb diet in general without simple or added sugars     PHYSICAL EXAM:  Blood pressure 121/74, pulse 81, temperature 98.2 F (36.8 C), height 5\' 3"  (1.6 m), weight 178 lb (80.7 kg), SpO2 100%. Body mass index is 31.53 kg/m.  General: She is overweight, cooperative, alert, well developed, and in no acute distress. PSYCH: Has normal mood, affect and thought process.   HEENT: EOMI, sclerae are anicteric. Lungs: Normal breathing effort, no conversational dyspnea. Extremities: No edema.  Neurologic: No gross sensory or motor deficits. No tremors or fasciculations noted.    DIAGNOSTIC DATA REVIEWED:  BMET    Component Value Date/Time   NA 142 01/24/2023 1217   NA 142 06/27/2022 1007   K 4.7 01/24/2023 1217   CL 106 01/24/2023 1217   CO2 25 01/24/2023 1217   GLUCOSE 53 (L) 01/24/2023 1217   BUN 11 01/24/2023 1217   BUN 13 06/27/2022 1007   CREATININE 0.77 01/24/2023 1217   CALCIUM 9.8 01/24/2023 1217   GFRNONAA 66 12/07/2020 1518   GFRAA 77 12/07/2020 1518   Lab Results  Component Value Date   HGBA1C 6.4 (H) 06/27/2022   Lab Results  Component Value Date   INSULIN 23.1 06/27/2022   Lab Results  Component Value Date   TSH 1.070 06/27/2022   CBC    Component Value Date/Time   WBC 7.5 01/24/2023 1217   RBC 3.93 01/24/2023 1217   HGB 13.0 01/24/2023 1217   HCT 39.1 01/24/2023 1217   PLT 371 01/24/2023 1217   MCV 99.5 01/24/2023 1217   MCH 33.1 (H) 01/24/2023 1217    MCHC 33.2 01/24/2023 1217   RDW 12.9 01/24/2023 1217   Iron Studies No results found for: "IRON", "TIBC", "FERRITIN", "IRONPCTSAT" Lipid Panel     Component Value Date/Time   CHOL 234 (H) 06/27/2022 1007   TRIG 144 06/27/2022 1007   HDL 43 06/27/2022 1007   LDLCALC 165 (H) 06/27/2022 1007   Hepatic Function Panel     Component Value Date/Time   PROT 7.1 01/24/2023 1217   PROT 7.8 06/27/2022 1007   ALBUMIN 4.5 06/27/2022 1007   AST 17 01/24/2023 1217   ALT 16 01/24/2023 1217   ALKPHOS 88 06/27/2022 1007   BILITOT 0.4 01/24/2023 1217   BILITOT 0.5 06/27/2022 1007      Component Value Date/Time   TSH 1.070 06/27/2022 1007   Nutritional Lab Results  Component Value Date   VD25OH 51.7 10/18/2022   VD25OH 11.4 (L) 06/27/2022     Return in about 3 weeks (around 04/09/2023) for For Weight Mangement with Dr. Rikki Spearing.Marland Kitchen She was informed of the importance of frequent follow up visits to maximize her success with intensive lifestyle modifications for her multiple health conditions.   ATTESTASTION STATEMENTS:  Reviewed by clinician on day of visit: allergies, medications, problem list, medical history, surgical history, family history, social history, and previous encounter notes.   I have spent 30 minutes in the care of the patient today including: preparing to see patient (e.g. review and interpretation of tests, old notes ), obtaining and/or reviewing separately obtained history, performing a medically appropriate examination or evaluation, counseling and educating the patient, and documenting clinical information in the electronic or other health care record   Worthy Rancher, MD

## 2023-03-19 NOTE — Assessment & Plan Note (Signed)
Her most recent A1c was 6.0 at [facility] physicians group and improved from 6.4.  She is currently on metformin 500 mg twice daily without any adverse effects she will continue with pharmacoprophylaxis as well as nutritional and behavioral approaches to weight management.  She maintains a low-carb diet in general without simple or added sugars

## 2023-04-01 ENCOUNTER — Other Ambulatory Visit: Payer: Self-pay | Admitting: Internal Medicine

## 2023-04-01 DIAGNOSIS — H15003 Unspecified scleritis, bilateral: Secondary | ICD-10-CM

## 2023-04-01 DIAGNOSIS — H2013 Chronic iridocyclitis, bilateral: Secondary | ICD-10-CM

## 2023-04-02 NOTE — Telephone Encounter (Signed)
Last Fill: 10/01/2022  Labs: 01/24/2023 Her lab results got better the liver enzyme tests are completely normal now. Sedimentation rate also remains normal. So there is no need to change medications.   Next Visit: 04/26/2023  Last Visit: 01/24/2023  DX: Bilateral scleritis   Current Dose per office note 01/24/2023: methotrexate 12.5 mg p.o. weekly   Okay to refill Methotrexate?

## 2023-04-12 NOTE — Progress Notes (Deleted)
Office Visit Note  Patient: Kari Williams             Date of Birth: 30-Jan-1961           MRN: [id number]             PCP: Renford Dills, MD Referring: Renford Dills, MD Visit Date: 04/26/2023   Subjective:  No chief complaint on file.   History of Present Illness: Kari Williams is a 62 y.o. 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 14 days and methotrexate 12.5 mg p.o. weekly and folic acid 2 mg daily.    Previous HPI 01/24/2023 Kari Williams is a 62 y.o. 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 14 days and methotrexate 12.5 mg p.o. weekly and folic acid 2 mg daily.  Most recent liver enzyme tests from last visit were elevated but mildly so I will trend for monitoring methotrexate.  No new eye pain or visual change complaints.  Has been having intermittent pain at the right hip comes and goes.  Often gets worse moving after sitting in a prolonged fixed position or direct pressure such as lying on the right side.  Also having pain in the neck had an MRI for this that demonstrated a bulging disc.  She is scheduled for steroid injection next Friday for this.     Previous HPI 10/11/22 Kari Williams is a 62 y.o. female here for follow up follow-up for uveitis and scleritis on Humira 40 mg subcu q. 14 days methotrexate 12.5 mg p.o. weekly and folic acid 2 mg daily.  Overall she is doing pretty well since last visit still gets some knee pain without a lot of swelling.  Eye dryness and mild irritation no serious pain inflammation or visual acuity changes.  Most recent ophthalmology exam looked good.  She had recent labs obtained earlier this month with a normal complete blood count metabolic panel showed mild AST elevation to 57.     Previous HPI 06/19/22 Kari Williams is a 62 y.o. female here for follow up for uveitis and scleritis on Humira 40 mg [place] q14days and MTX 12.5 mg PO weekly and  folic acid 2 mg daily. After last visit right knee pain improved and doing well now. She had follow up with Dr. Sherryll Burger eye exam negative for active uveitis although had some dryness and surface irritation. Getting restasis approved for this. She also has migraines which were a problem I the past but had been free of these for years until recently.    Previous HPI 03/15/22 Kari Williams is a 62 y.o. 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 She had a UTI in June treated with nitrofurantoin then switched to cefuroxime based on susceptibilities and symptoms resolved.  Overall she is doing well no symptomatic complaints with eye irritation or vision changes.  She has some right knee pain and stiffness does not severely limit her mobility.  Symptoms are most noticeable with certain movements such as descending stairs.     Previous HPI 12/13/2021 Kari Williams is a 62 y.o. female here for follow up for uveiitis and scleritis on methotrexate 12.5 mg PO weekly and Humira [place] q14days. Since our last visit her finger pain is improved not needing any additional NSAIDs for symptom management. She has floaters but no new visual complaint no eye redness or pain.   Previous  HPI 09/13/2021 Kari Williams is a 62 y.o. female here for follow up for uveiitis and scleritis on methotrexate 15 mg PO weekly and Humira [place] q14days. Last visit with Dr. Sherryll Burger 08/01/21. Eyes are doing okay without new complaints and exam was okay. She has noticed some increased pain and swelling affecting left hand fingers also mildly tender nodule swelling behind her left ear.   Previous HPI 06/14/21 Kari Williams is a 62 y.o. female here for follow up for uveitis and scleritis on methotrexate 15 mg PO weekly and Humira 40 mg [place] q14days. She has experienced small painless skin rashes in a few places including the left elbow these improved with conservative treatment moisturizing. She has left calf cramps at night  ongoing no problems during the day or with mobility. Bilateral eye dryness is slightly worse than before during day time, using lubricating eye drops more than before. She has future appointment with Dr. Sherryll Burger scheduled for next week.   Previous HPI 06/07/20 Kari Williams is a 62 y.o. 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 She has a previous history of treatment with methotrexate for iritis in 2010-2015 with Dr. Elmer Picker and Dr. Kellie Simmering. She had been off any treatment but visit with Dr. 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  Since earlier this year she has experienced eye redness watering and blurry vision.  She is more symptomatic complaint on the right than on the left currently but both sides bother her.  Besides the eye complaints she does also report some fairly diffuse arthralgias and morning stiffness up to 30 minutes.  She has not noticed any specific joint swelling warmth or erythema.  In the past she has also been treated with oral or ocular steroids but has not received any for the current relapse of symptoms.   No Rheumatology ROS completed.   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   High risk medication use 06/07/2020   Carpal tunnel syndrome 11/29/2015   Hypertensive retinopathy of both eyes 11/01/2015   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   Nuclear sclerotic cataract of both eyes  10/13/2015   Epiretinal membrane (ERM) of right eye 10/13/2015   Lumbosacral radiculitis 09/01/2014   Routine gynecological examination 11/19/2011   Abdominal or pelvic swelling, mass, or lump, generalized 11/19/2011   Overweight (BMI 25.0-29.9) 11/19/2011   Fibroids 11/19/2011   HTN (hypertension) 11/19/2011   Condyloma 11/19/2011    Past Medical History:  Diagnosis Date   Allergy    seasonal   Anxiety    Arthritis    osteoarthritis/ddd   Bulging of cervical intervertebral disc    Chest pain    Depression    Female infertility    Hypertension    Rheumatoid arthritis (HCC)     Family History  Problem Relation Age of Onset   Dementia Mother    Hypotension Mother    Stroke Father  Hypertension Father    Sudden death Father    Breast cancer Sister    Hypertension Sister    Thyroid disease Sister    Colon cancer Brother    Sleep apnea Brother    Stroke Brother    Lupus Paternal Aunt    Lupus Niece    Past Surgical History:  Procedure Laterality Date   ABDOMINAL HYSTERECTOMY     CARPAL TUNNEL RELEASE  07/30/2000   right   COLONOSCOPY     LUMBAR DISC SURGERY  07/30/2004   LUMBAR FUSION  07/30/2009   THORACIC DISC SURGERY  07/30/2000   TUBAL LIGATION  07/02/2012   Procedure: BILATERAL TUBAL LIGATION;  Surgeon: Kirkland Hun, MD;  Location: [facility];  Service: Gynecology;  Laterality: Bilateral;   UPPER GASTROINTESTINAL ENDOSCOPY     VAGINAL HYSTERECTOMY  07/02/2012   Procedure: HYSTERECTOMY VAGINAL;  Surgeon: Kirkland Hun, MD;  Location: [facility];  Service: Gynecology;  Laterality: N/A;   Social History   Social History Narrative   Not on file   Immunization History  Administered Date(s) Administered   Influenza Split 07/03/2012   PFIZER(Purple Top)SARS-COV-2 Vaccination 10/13/2019, 11/03/2019     Objective: Vital Signs: There were no vitals taken for this visit.   Physical Exam   Musculoskeletal Exam: ***  CDAI Exam: CDAI Score: -- Patient  Global: --; Provider Global: -- Swollen: --; Tender: -- Joint Exam 04/26/2023   No joint exam has been documented for this visit   There is currently no information documented on the homunculus. Go to the Rheumatology activity and complete the homunculus joint exam.  Investigation: No additional findings.  Imaging: No results found.  Recent Labs: Lab Results  Component Value Date   WBC 7.5 01/24/2023   HGB 13.0 01/24/2023   PLT 371 01/24/2023   NA 142 01/24/2023   K 4.7 01/24/2023   CL 106 01/24/2023   CO2 25 01/24/2023   GLUCOSE 53 (L) 01/24/2023   BUN 11 01/24/2023   CREATININE 0.77 01/24/2023   BILITOT 0.4 01/24/2023   ALKPHOS 88 06/27/2022   AST 17 01/24/2023   ALT 16 01/24/2023   PROT 7.1 01/24/2023   ALBUMIN 4.5 06/27/2022   CALCIUM 9.8 01/24/2023   GFRAA 77 12/07/2020   QFTBGOLDPLUS NEGATIVE 06/19/2022    Speciality Comments: No specialty comments available.  Procedures:  No procedures performed Allergies: Penicillins   Assessment / Plan:     Visit Diagnoses: No diagnosis found.  ***  Orders: No orders of the defined types were placed in this encounter.  No orders of the defined types were placed in this encounter.    Follow-Up Instructions: No follow-ups on file.   Metta Clines, RT  Note - This record has been created using [employer].  Chart creation errors have been sought, but may not always  have been located. Such creation errors do not reflect on  the standard of medical care.

## 2023-04-26 ENCOUNTER — Ambulatory Visit: Payer: BC Managed Care – PPO | Admitting: Internal Medicine

## 2023-04-26 DIAGNOSIS — M67951 Unspecified disorder of synovium and tendon, right thigh: Secondary | ICD-10-CM

## 2023-04-26 DIAGNOSIS — H2013 Chronic iridocyclitis, bilateral: Secondary | ICD-10-CM

## 2023-04-26 DIAGNOSIS — Z79899 Other long term (current) drug therapy: Secondary | ICD-10-CM

## 2023-04-26 DIAGNOSIS — H15003 Unspecified scleritis, bilateral: Secondary | ICD-10-CM

## 2023-04-26 NOTE — Progress Notes (Signed)
Office Visit Note  Patient: Kari Williams             Date of Birth: Dec 13, 1960           MRN: [id number]             PCP: Renford Dills, MD Referring: Renford Dills, MD Visit Date: 05/02/2023   Subjective:  Follow-up (Increased fatigue, also has complaints of dizziness. )   History of Present Illness: Kari Williams is a 62 y.o. 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 14 days and methotrexate 12.5 mg p.o. weekly and folic acid 2 mg daily.  No new changes with vision or episodes of eye redness or pain.  No new joint pain complaints she has partial improvement with the previous right hip and gluteal area tenderness and stiffness she has been working on this with exercise.  Is also working with healthy weight and wellness clinic has not had much weight change.  She discussed starting Zepbound with her PCP she is currently on metformin twice daily.  Has a new problem especially for the past few weeks now with dizziness first thing in the mornings gets better when she is up and moving for a while and after breakfast.  Previous HPI 01/24/2023 Kari Williams is a 62 y.o. 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 14 days and methotrexate 12.5 mg p.o. weekly and folic acid 2 mg daily.  Most recent liver enzyme tests from last visit were elevated but mildly so I will trend for monitoring methotrexate.  No new eye pain or visual change complaints.  Has been having intermittent pain at the right hip comes and goes.  Often gets worse moving after sitting in a prolonged fixed position or direct pressure such as lying on the right side.  Also having pain in the neck had an MRI for this that demonstrated a bulging disc.  She is scheduled for steroid injection next Friday for this.   Previous HPI 10/11/22 Kari Williams is a 62 y.o. female here for follow up follow-up for uveitis and scleritis on Humira 40 mg subcu  q. 14 days methotrexate 12.5 mg p.o. weekly and folic acid 2 mg daily.  Overall she is doing pretty well since last visit still gets some knee pain without a lot of swelling.  Eye dryness and mild irritation no serious pain inflammation or visual acuity changes.  Most recent ophthalmology exam looked good.  She had recent labs obtained earlier this month with a normal complete blood count metabolic panel showed mild AST elevation to 57.     Previous HPI 06/19/22 Kari Williams is a 62 y.o. female here for follow up for uveitis and scleritis on Humira 40 mg [place] q14days and MTX 12.5 mg PO weekly and folic acid 2 mg daily. After last visit right knee pain improved and doing well now. She had follow up with Dr. Sherryll Burger eye exam negative for active uveitis although had some dryness and surface irritation. Getting restasis approved for this. She also has migraines which were a problem I the past but had been free of these for years until recently.    Previous HPI 03/15/22 Kari Williams is a 62 y.o. 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 She had a UTI in June treated with nitrofurantoin then switched to cefuroxime based on susceptibilities and symptoms resolved.  Overall  she is doing well no symptomatic complaints with eye irritation or vision changes.  She has some right knee pain and stiffness does not severely limit her mobility.  Symptoms are most noticeable with certain movements such as descending stairs.     Previous HPI 12/13/2021 Kari Williams is a 62 y.o. female here for follow up for uveiitis and scleritis on methotrexate 12.5 mg PO weekly and Humira [place] q14days. Since our last visit her finger pain is improved not needing any additional NSAIDs for symptom management. She has floaters but no new visual complaint no eye redness or pain.   Previous HPI 09/13/2021 Kari Williams is a 62 y.o. female here for follow up for uveiitis and scleritis on methotrexate 15 mg PO  weekly and Humira [place] q14days. Last visit with Dr. Sherryll Burger 08/01/21. Eyes are doing okay without new complaints and exam was okay. She has noticed some increased pain and swelling affecting left hand fingers also mildly tender nodule swelling behind her left ear.   Previous HPI 06/14/21 Kari Williams is a 62 y.o. female here for follow up for uveitis and scleritis on methotrexate 15 mg PO weekly and Humira 40 mg [place] q14days. She has experienced small painless skin rashes in a few places including the left elbow these improved with conservative treatment moisturizing. She has left calf cramps at night ongoing no problems during the day or with mobility. Bilateral eye dryness is slightly worse than before during day time, using lubricating eye drops more than before. She has future appointment with Dr. Sherryll Burger scheduled for next week.   Previous HPI 06/07/20 Kari Williams is a 62 y.o. 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 She has a previous history of treatment with methotrexate for iritis in 2010-2015 with Dr. Elmer Picker and Dr. Kellie Simmering. She had been off any treatment but visit with Dr. 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  Since earlier this year she has experienced eye redness watering and blurry vision.  She is more symptomatic complaint on the right than on the left currently but both sides bother her.  Besides the eye complaints she does also report some fairly diffuse arthralgias and morning stiffness up to 30 minutes.  She has not noticed any specific joint swelling warmth or erythema.  In the past she has also been treated with oral or ocular steroids but has not received any for the current relapse of symptoms.   Review of Systems  Constitutional:  Positive for fatigue.  HENT:  Negative for mouth sores and mouth dryness.   Eyes:  Positive for dryness.  Respiratory:  Negative for shortness of breath.    Cardiovascular:  Negative for chest pain and palpitations.  Gastrointestinal:  Negative for blood in stool, constipation and diarrhea.  Endocrine: Negative for increased urination.  Genitourinary:  Negative for involuntary urination.  Musculoskeletal:  Positive for joint pain, gait problem, joint pain and morning stiffness. Negative for joint swelling, myalgias, muscle weakness, muscle tenderness and myalgias.  Skin:  Negative for color change, rash, hair loss and sensitivity to sunlight.  Allergic/Immunologic: Negative for susceptible to infections.  Neurological:  Positive for dizziness. Negative for headaches.  Hematological:  Negative for swollen glands.  Psychiatric/Behavioral:  Positive for depressed mood and sleep disturbance. The patient is nervous/anxious.     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   High risk medication use 06/07/2020   Carpal tunnel syndrome 11/29/2015   Hypertensive retinopathy of both eyes 11/01/2015   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   Nuclear sclerotic cataract of both eyes 10/13/2015   Epiretinal membrane (ERM) of right eye 10/13/2015   Lumbosacral radiculitis 09/01/2014   Encounter for routine gynecological examination 11/19/2011   Generalized abdominal or pelvic swelling or mass or lump 11/19/2011   Overweight (BMI 25.0-29.9) 11/19/2011   Fibroids 11/19/2011   HTN (hypertension) 11/19/2011   Condyloma  11/19/2011    Past Medical History:  Diagnosis Date   Allergy    seasonal   Anxiety    Arthritis    osteoarthritis/ddd   Bulging of cervical intervertebral disc    Chest pain    Depression    Female infertility    Hypertension    Rheumatoid arthritis (HCC)     Family History  Problem Relation Age of Onset   Dementia Mother    Hypotension Mother    Stroke Father    Hypertension Father    Sudden death Father    Breast cancer Sister    Hypertension Sister    Thyroid disease Sister    Colon cancer Brother    Sleep apnea Brother    Stroke Brother    Lupus Paternal Aunt    Lupus Niece    Past Surgical History:  Procedure Laterality Date   ABDOMINAL HYSTERECTOMY     CARPAL TUNNEL RELEASE  07/30/2000   right   COLONOSCOPY     LUMBAR DISC SURGERY  07/30/2004   LUMBAR FUSION  07/30/2009   THORACIC DISC SURGERY  07/30/2000   TUBAL LIGATION  07/02/2012   Procedure: BILATERAL TUBAL LIGATION;  Surgeon: Kirkland Hun, MD;  Location: [facility];  Service: Gynecology;  Laterality: Bilateral;   UPPER GASTROINTESTINAL ENDOSCOPY     VAGINAL HYSTERECTOMY  07/02/2012   Procedure: HYSTERECTOMY VAGINAL;  Surgeon: Kirkland Hun, MD;  Location: [facility];  Service: Gynecology;  Laterality: N/A;   Social History   Social History Narrative   Not on file   Immunization History  Administered Date(s) Administered   Influenza Split 07/03/2012   PFIZER(Purple Top)SARS-COV-2 Vaccination 10/13/2019, 11/03/2019     Objective: Vital Signs: BP 118/77 (BP Location: Left Arm, Patient Position: Sitting, Cuff Size: Large)   Pulse 82   Resp 13   Ht 5\' 3"  (1.6 m)   Wt 183 lb (83 kg)   BMI 32.42 kg/m    Physical Exam Eyes:     Conjunctiva/sclera: Conjunctivae normal.  Cardiovascular:     Rate and Rhythm: Normal rate and regular rhythm.  Pulmonary:     Effort: Pulmonary effort is normal.     Breath sounds: Normal breath sounds.  Musculoskeletal:     Right lower leg: No edema.     Left  lower leg: No edema.  Skin:    General: Skin is warm and dry.     Findings: No rash.  Neurological:     Mental Status: She is alert.  Psychiatric:        Mood and Affect: Mood normal.      Musculoskeletal Exam:  Shoulders full ROM no tenderness or swelling Elbows full ROM no tenderness or swelling Wrists full ROM no tenderness or swelling Fingers full ROM no tenderness or swelling Mild right hip lateral tenderness to pressure with no radiation, normal internal/external rotation Knees full ROM no tenderness or swelling  Investigation: No additional findings.  Imaging: No results found.  Recent Labs: Lab Results  Component Value Date   WBC 7.4 05/02/2023   HGB 12.9 05/02/2023   PLT 350 05/02/2023   NA 142 05/02/2023   K 4.6 05/02/2023   CL 105 05/02/2023   CO2 29 05/02/2023   GLUCOSE 87 05/02/2023   BUN 10 05/02/2023   CREATININE 0.87 05/02/2023   BILITOT 0.4 05/02/2023   ALKPHOS 88 06/27/2022   AST 21 05/02/2023   ALT 18 05/02/2023   PROT 7.1 05/02/2023   ALBUMIN 4.5 06/27/2022   CALCIUM 9.8 05/02/2023   GFRAA 77 12/07/2020   QFTBGOLDPLUS NEGATIVE 06/19/2022    Speciality Comments: No specialty comments available.  Procedures:  No procedures performed Allergies: Penicillins   Assessment / Plan:     Visit Diagnoses: Bilateral scleritis Bilateral chronic anterior uveitis - Plan: Sedimentation rate  Inflammation appears well-controlled on her current regimen.  Checking sed rate for disease activity monitoring.  Plan to continue Humira 40 mg subcu q. 14 days methotrexate 12.5 mg p.o. weekly folic acid 2 mg daily.  High risk medication use - Humira 40 mg subcu q. 14 days and methotrexate 12.5 mg p.o. weekly and folic acid 2 mg daily. - Plan: CBC with Differential/Platelet, COMPLETE METABOLIC PANEL WITH GFR, QuantiFERON-TB Gold Plus  Checking CBC and CMP and rechecking QuantiFERON screening for long-term continued use of Humira and methotrexate.  No serious  interval infections.  New symptom complained of dizziness is not very typical for treatment and without new change in medications I think that is less likely although further tapering down methotrexate may be a good option for her anyways.  Tendinopathy of right gluteus medius  Making partial improvement still has some tenderness with pressure and range of motion in the right gluteal muscles.  Not suggestive for intra-articular pathology.  Prediabetes - Plan: Hemoglobin A1c  Symptomatic complaint of lightheadedness first thing in the morning along with her work to improve diet and lose weight I wonder if she might be overtreated on the current metformin twice daily.  Last hemoglobin A1c was checked last September.  Of note her glucose was 53 on the metabolic panel at our last visit.  Will recheck A1c level today.  Orders: Orders Placed This Encounter  Procedures   Sedimentation rate   CBC with Differential/Platelet   COMPLETE METABOLIC PANEL WITH GFR   Hemoglobin A1c   QuantiFERON-TB Gold Plus   No orders of the defined types were placed in this encounter.    Follow-Up Instructions: Return in about 3 months (around 08/02/2023) for Uveiitis on ADA/MTX f/u 3mos.   Fuller Plan, MD  Note - This record has been created using [employer].  Chart creation errors have been sought, but may not always  have been located. Such creation errors do not reflect on  the standard of medical care.

## 2023-04-30 ENCOUNTER — Ambulatory Visit (INDEPENDENT_AMBULATORY_CARE_PROVIDER_SITE_OTHER): Payer: BC Managed Care – PPO | Admitting: Internal Medicine

## 2023-04-30 ENCOUNTER — Other Ambulatory Visit (HOSPITAL_BASED_OUTPATIENT_CLINIC_OR_DEPARTMENT_OTHER): Payer: Self-pay

## 2023-04-30 ENCOUNTER — Encounter (INDEPENDENT_AMBULATORY_CARE_PROVIDER_SITE_OTHER): Payer: Self-pay | Admitting: Internal Medicine

## 2023-04-30 VITALS — BP 129/83 | HR 92 | Temp 98.9°F | Ht 63.0 in | Wt 177.0 lb

## 2023-04-30 DIAGNOSIS — R7303 Prediabetes: Secondary | ICD-10-CM

## 2023-04-30 DIAGNOSIS — Z6830 Body mass index (BMI) 30.0-30.9, adult: Secondary | ICD-10-CM

## 2023-04-30 DIAGNOSIS — E66811 Obesity, class 1: Secondary | ICD-10-CM | POA: Diagnosis not present

## 2023-04-30 DIAGNOSIS — I10 Essential (primary) hypertension: Secondary | ICD-10-CM

## 2023-04-30 DIAGNOSIS — E7800 Pure hypercholesterolemia, unspecified: Secondary | ICD-10-CM

## 2023-04-30 MED ORDER — TIRZEPATIDE-WEIGHT MANAGEMENT 2.5 MG/0.5ML ~~LOC~~ SOAJ
2.5000 mg | SUBCUTANEOUS | 0 refills | Status: DC
Start: 2023-04-30 — End: 2023-05-23
  Filled 2023-04-30: qty 2, 28d supply, fill #0

## 2023-04-30 NOTE — Progress Notes (Signed)
Office: [phone]  /  Fax: [phone]  WEIGHT SUMMARY AND BIOMETRICS  Vitals Temp: 98.9 F (37.2 C) BP: 129/83 Pulse Rate: 92 SpO2: 100 %   Anthropometric Measurements Height: 5\' 3"  (1.6 m) Weight: 177 lb (80.3 kg) BMI (Calculated): 31.36 Weight at Last Visit: 178 lb Weight Lost Since Last Visit: 1 lb Weight Gained Since Last Visit: 0 lb Starting Weight: 180 lb Total Weight Loss (lbs): 3 lb (1.361 kg) Peak Weight: 230 lb   Body Composition  Body Fat %: 35.4 % Fat Mass (lbs): 63 lbs Muscle Mass (lbs): 109 lbs Total Body Water (lbs): 76 lbs Visceral Fat Rating : 10    No data recorded Today's Visit #: 11  Starting Date: 06/27/22    Chief Complaint: OBESITY  HPI  Discussed the use of AI scribe software for clinical note transcription with the patient, who gave verbal consent to proceed.  History of Present Illness   Kari Williams presents today for follow-up consultation on obesity. She is currently enrolled in a category two  plan, reports adherence to the plan approximately 80% of the time. She has been engaging in regular walking and stretching exercises. However, she expresses frustration with recurrent injuries associated with exercise, including neck and hip issues.  She is now enrolled in Kari Williams well and will be starting supervised exercise program with a trainer.  The patient's dietary habits appear consistent and generally healthy. Her daily intake includes [place] sausages, a vegetable-based muffin, an apple, and a meal replacement drink. Dinner typically consists of meat and roasted vegetables, occasionally accompanied by a sweet potato or rice. She admits to a weekly indulgence of almond waffles. Despite this regimen, the patient expresses frustration with a weight loss plateau and minimal recent weight loss.  The patient also reports taking vitamin D supplements and metformin. She has a history of rheumatological issues and is currently on Humira,  administered every other week. She is due to see her rheumatologist soon.    Barriers identified: orthopedic problems, medical conditions or chronic pain affecting mobility.   Pharmacotherapy for weight loss: She is currently taking Metformin (off label use for incretin effect and / or insulin resistance and / or diabetes prevention) with adequate clinical response  and without side effects..    ASSESSMENT AND PLAN  TREATMENT PLAN FOR OBESITY:  Recommended Dietary Goals  Kari Williams is currently in the action stage of change. As such, her goal is to continue weight management plan. She has agreed to: continue current plan  Behavioral Intervention  We discussed the following Behavioral Modification Strategies today: increasing lean protein intake, decreasing simple carbohydrates , increasing vegetables, increasing lower glycemic fruits, increasing water intake, work on meal planning and preparation, work on tracking and journaling calories using tracking application, continue to practice mindfulness when eating, and planning for success.  Additional resources provided today: None  Recommended Physical Activity Goals  Kari Williams has been advised to work up to 150 minutes of moderate intensity aerobic activity a week and strengthening exercises 2-3 times per week for cardiovascular health, weight loss maintenance and preservation of muscle mass.   She has agreed to :  Start strengthening exercises with a goal of 2-3 sessions a week , Start aerobic activity with a goal of 150 minutes a week at moderate intensity. , and will be working with a Chief Executive Officer at [employer] well  Pharmacotherapy We discussed various medication options to help [name] with her weight loss efforts and we both agreed to : start anti-obesity medication.  In addition to reduced calorie nutrition plan (RCNP), behavioral strategies and physical activity, [name] would benefit from pharmacotherapy to assist with hunger signals,  satiety and cravings. This will reduce obesity-related health risks by inducing weight loss, and help reduce food consumption and adherence to [name]) . It may also improve QOL by improving self-confidence and reduce the  setbacks associated with metabolic adaptations.  After discussion of treatment options, mechanisms of action, benefits, side effects, contraindications and shared decision making she is agreeable to starting Zepbound 2.5 mg once a week. Patient also made aware that medication is indicated for long-term management of obesity and the risk of weight regain following discontinuation of treatment and hence the importance of adhering to medical weight loss plan.  We demonstrated use of device and patient using teach back method was able to demonstrate proper technique.  ASSOCIATED CONDITIONS ADDRESSED TODAY  Class 1 obesity with serious comorbidity and body mass index (BMI) of 30.0 to 30.9 in adult, unspecified obesity type Assessment & Plan: [name] has been working hard making lifestyle changes since November 2023 but is having difficulties losing weight.  She would benefit from antiobesity medications to maintain a reduced caloric state and help support her weight loss efforts.  After discussion of treatment options she is agreeable to starting Zepbound 2.5 mg once a week.  Please refer to obesity treatment plan for additional discussion.  Orders: -     Tirzepatide-Weight Management; Inject 2.5 mg into the skin once a week.  Dispense: 2 mL; Refill: 0  Prediabetes Assessment & Plan: Her hemoglobin A1c in November 2023 was 6.4 a follow-up A1c done at [facility] on March 2024 was 6.0 fasting blood glucose was normal.  She is currently on metformin tolerating medication well without any adverse effects she will continue medication.    Orders: -     Tirzepatide-Weight Management; Inject 2.5 mg into the skin once a week.  Dispense: 2 mL; Refill:  0  Hypercholesteremia Assessment & Plan: I reviewed labs done at [facility] physicians in March of this year she had an LDL cholesterol in the 140s.  She is not on statin therapy and has a chronic inflammatory condition.  We will assess cardiovascular risk and repeat fasting labs in the near future.  I will also review records from primary care.   Primary hypertension Assessment & Plan:  Blood pressure close to goal.  She is currently on amlodipine 10 mg a day and losartan 100 mg.  Continue current regimen.     PHYSICAL EXAM:  Blood pressure 129/83, pulse 92, temperature 98.9 F (37.2 C), height 5\' 3"  (1.6 m), weight 177 lb (80.3 kg), SpO2 100%. Body mass index is 31.35 kg/m.  General: She is overweight, cooperative, alert, well developed, and in no acute distress. PSYCH: Has normal mood, affect and thought process.   HEENT: EOMI, sclerae are anicteric. Lungs: Normal breathing effort, no conversational dyspnea. Extremities: No edema.  Neurologic: No gross sensory or motor deficits. No tremors or fasciculations noted.    DIAGNOSTIC DATA REVIEWED:  BMET    Component Value Date/Time   NA 142 01/24/2023 1217   NA 142 06/27/2022 1007   K 4.7 01/24/2023 1217   CL 106 01/24/2023 1217   CO2 25 01/24/2023 1217   GLUCOSE 53 (L) 01/24/2023 1217   BUN 11 01/24/2023 1217   BUN 13 06/27/2022 1007   CREATININE 0.77 01/24/2023 1217   CALCIUM 9.8 01/24/2023 1217   GFRNONAA 66 12/07/2020 1518   GFRAA 77  12/07/2020 1518   Lab Results  Component Value Date   HGBA1C 6.4 (H) 06/27/2022   Lab Results  Component Value Date   INSULIN 23.1 06/27/2022   Lab Results  Component Value Date   TSH 1.070 06/27/2022   CBC    Component Value Date/Time   WBC 7.5 01/24/2023 1217   RBC 3.93 01/24/2023 1217   HGB 13.0 01/24/2023 1217   HCT 39.1 01/24/2023 1217   PLT 371 01/24/2023 1217   MCV 99.5 01/24/2023 1217   MCH 33.1 (H) 01/24/2023 1217   MCHC 33.2 01/24/2023 1217   RDW 12.9 01/24/2023  1217   Iron Studies No results found for: "IRON", "TIBC", "FERRITIN", "IRONPCTSAT" Lipid Panel     Component Value Date/Time   CHOL 234 (H) 06/27/2022 1007   TRIG 144 06/27/2022 1007   HDL 43 06/27/2022 1007   LDLCALC 165 (H) 06/27/2022 1007   Hepatic Function Panel     Component Value Date/Time   PROT 7.1 01/24/2023 1217   PROT 7.8 06/27/2022 1007   ALBUMIN 4.5 06/27/2022 1007   AST 17 01/24/2023 1217   ALT 16 01/24/2023 1217   ALKPHOS 88 06/27/2022 1007   BILITOT 0.4 01/24/2023 1217   BILITOT 0.5 06/27/2022 1007      Component Value Date/Time   TSH 1.070 06/27/2022 1007   Nutritional Lab Results  Component Value Date   VD25OH 51.7 10/18/2022   VD25OH 11.4 (L) 06/27/2022     Return in about 3 weeks (around 05/21/2023) for For Weight Mangement with Dr. Rikki Spearing.Marland Kitchen She was informed of the importance of frequent follow up visits to maximize her success with intensive lifestyle modifications for her multiple health conditions.   ATTESTASTION STATEMENTS:  Reviewed by clinician on day of visit: allergies, medications, problem list, medical history, surgical history, family history, social history, and previous encounter notes.     Worthy Rancher, MD

## 2023-04-30 NOTE — Assessment & Plan Note (Signed)
Blood pressure close to goal.  She is currently on amlodipine 10 mg a day and losartan 100 mg.  Continue current regimen.

## 2023-04-30 NOTE — Assessment & Plan Note (Addendum)
Her hemoglobin A1c in November 2023 was 6.4 a follow-up A1c done at [facility] physicians group on March 2024 was 6.0 fasting blood glucose was normal.  She is currently on metformin tolerating medication well without any adverse effects she will continue medication.

## 2023-04-30 NOTE — Assessment & Plan Note (Signed)
I reviewed labs done at [facility] physicians in March of this year she had an LDL cholesterol in the 140s.  She is not on statin therapy and has a chronic inflammatory condition.  We will assess cardiovascular risk and repeat fasting labs in the near future.  I will also review records from primary care.

## 2023-04-30 NOTE — Assessment & Plan Note (Addendum)
Kari Williams has been working hard making lifestyle changes since November 2023 but is having difficulties losing weight.  She would benefit from antiobesity medications to maintain a reduced caloric state and help support her weight loss efforts.  After discussion of treatment options she is agreeable to starting Zepbound 2.5 mg once a week.  Please refer to obesity treatment plan for additional discussion.

## 2023-05-02 ENCOUNTER — Ambulatory Visit: Payer: BC Managed Care – PPO | Attending: Internal Medicine | Admitting: Internal Medicine

## 2023-05-02 ENCOUNTER — Encounter: Payer: Self-pay | Admitting: Internal Medicine

## 2023-05-02 VITALS — BP 118/77 | HR 82 | Resp 13 | Ht 63.0 in | Wt 183.0 lb

## 2023-05-02 DIAGNOSIS — Z79899 Other long term (current) drug therapy: Secondary | ICD-10-CM | POA: Diagnosis not present

## 2023-05-02 DIAGNOSIS — R7303 Prediabetes: Secondary | ICD-10-CM

## 2023-05-02 DIAGNOSIS — H15003 Unspecified scleritis, bilateral: Secondary | ICD-10-CM

## 2023-05-02 DIAGNOSIS — M67951 Unspecified disorder of synovium and tendon, right thigh: Secondary | ICD-10-CM | POA: Diagnosis not present

## 2023-05-02 DIAGNOSIS — H2013 Chronic iridocyclitis, bilateral: Secondary | ICD-10-CM | POA: Diagnosis not present

## 2023-05-03 ENCOUNTER — Other Ambulatory Visit: Payer: Self-pay | Admitting: *Deleted

## 2023-05-03 DIAGNOSIS — H15003 Unspecified scleritis, bilateral: Secondary | ICD-10-CM

## 2023-05-03 DIAGNOSIS — H2013 Chronic iridocyclitis, bilateral: Secondary | ICD-10-CM

## 2023-05-03 MED ORDER — METHOTREXATE SODIUM 2.5 MG PO TABS
10.0000 mg | ORAL_TABLET | ORAL | Status: DC
Start: 2023-05-03 — End: 2023-07-05

## 2023-05-03 NOTE — Progress Notes (Signed)
Sedimentation rate remains normal so no problem with inflammation.  Kidney and liver function are normal.  Your blood count shows a small increase in the MCV again up to 100.3.  I think should be okay to decrease the methotrexate down to 10 mg (4 tablets) weekly. Hemoglobin A1c is 6.0%.  I am suspicious she might be on more blood sugar lowering medication then she needs to be also dimensions to her primary care doctor and she can contact them to check. But I think she might benefit to decrease her metformin or try interrupting it to see if the morning symptoms resolved.

## 2023-05-04 LAB — COMPLETE METABOLIC PANEL WITH GFR
AG Ratio: 1.5 (calc) (ref 1.0–2.5)
ALT: 18 U/L (ref 6–29)
AST: 21 U/L (ref 10–35)
Albumin: 4.3 g/dL (ref 3.6–5.1)
Alkaline phosphatase (APISO): 65 U/L (ref 37–153)
BUN: 10 mg/dL (ref 7–25)
CO2: 29 mmol/L (ref 20–32)
Calcium: 9.8 mg/dL (ref 8.6–10.4)
Chloride: 105 mmol/L (ref 98–110)
Creat: 0.87 mg/dL (ref 0.50–1.05)
Globulin: 2.8 g/dL (ref 1.9–3.7)
Glucose, Bld: 87 mg/dL (ref 65–99)
Potassium: 4.6 mmol/L (ref 3.5–5.3)
Sodium: 142 mmol/L (ref 135–146)
Total Bilirubin: 0.4 mg/dL (ref 0.2–1.2)
Total Protein: 7.1 g/dL (ref 6.1–8.1)
eGFR: 74 mL/min/{1.73_m2} (ref >=60)

## 2023-05-04 LAB — CBC WITH DIFFERENTIAL/PLATELET
Absolute Monocytes: 777 {cells}/uL (ref 200–950)
Basophils Absolute: 52 {cells}/uL (ref 0–200)
Basophils Relative: 0.7 %
Eosinophils Absolute: 118 {cells}/uL (ref 15–500)
Eosinophils Relative: 1.6 %
HCT: 39.3 % (ref 35.0–45.0)
Hemoglobin: 12.9 g/dL (ref 11.7–15.5)
Lymphs Abs: 2383 {cells}/uL (ref 850–3900)
MCH: 32.9 pg (ref 27.0–33.0)
MCHC: 32.8 g/dL (ref 32.0–36.0)
MCV: 100.3 fL — ABNORMAL HIGH (ref 80.0–100.0)
MPV: 9.5 fL (ref 7.5–12.5)
Monocytes Relative: 10.5 %
Neutro Abs: 4070 {cells}/uL (ref 1500–7800)
Neutrophils Relative %: 55 %
Platelets: 350 10*3/uL (ref 140–400)
RBC: 3.92 10*6/uL (ref 3.80–5.10)
RDW: 12.5 % (ref 11.0–15.0)
Total Lymphocyte: 32.2 %
WBC: 7.4 10*3/uL (ref 3.8–10.8)

## 2023-05-04 LAB — QUANTIFERON-TB GOLD PLUS
Mitogen-NIL: >10 [IU]/mL
NIL: 0.03 [IU]/mL
QuantiFERON-TB Gold Plus: NEGATIVE
TB1-NIL: <0 [IU]/mL
TB2-NIL: <0 [IU]/mL

## 2023-05-04 LAB — HEMOGLOBIN A1C
Hgb A1c MFr Bld: 6 %{Hb} — ABNORMAL HIGH (ref <5.7)
Mean Plasma Glucose: 126 mg/dL
eAG (mmol/L): 7 mmol/L

## 2023-05-04 LAB — SEDIMENTATION RATE: Sed Rate: 11 mm/h (ref 0–30)

## 2023-05-06 ENCOUNTER — Other Ambulatory Visit (HOSPITAL_BASED_OUTPATIENT_CLINIC_OR_DEPARTMENT_OTHER): Payer: Self-pay

## 2023-05-08 DIAGNOSIS — E119 Type 2 diabetes mellitus without complications: Secondary | ICD-10-CM | POA: Diagnosis not present

## 2023-05-12 ENCOUNTER — Other Ambulatory Visit (HOSPITAL_BASED_OUTPATIENT_CLINIC_OR_DEPARTMENT_OTHER): Payer: Self-pay

## 2023-05-13 ENCOUNTER — Other Ambulatory Visit: Payer: Self-pay | Admitting: Internal Medicine

## 2023-05-13 DIAGNOSIS — H15003 Unspecified scleritis, bilateral: Secondary | ICD-10-CM

## 2023-05-13 DIAGNOSIS — H2013 Chronic iridocyclitis, bilateral: Secondary | ICD-10-CM

## 2023-05-13 NOTE — Telephone Encounter (Signed)
Last Fill: 10/11/2022  Labs: 05/02/2023 Sedimentation rate remains normal so no problem with inflammation.  Kidney and liver function are normal.  Your blood count shows a small increase in the MCV again up to 100.3.   TB Gold: 05/02/2023 Neg    Next Visit: 08/01/2022  Last Visit: 05/02/2023  DX: Bilateral scleritis   Current Dose per office note 05/02/2023: Humira 40 mg subcu q. 14 days   Okay to refill Humira?

## 2023-05-15 ENCOUNTER — Other Ambulatory Visit (HOSPITAL_BASED_OUTPATIENT_CLINIC_OR_DEPARTMENT_OTHER): Payer: Self-pay

## 2023-05-17 ENCOUNTER — Telehealth: Payer: Self-pay | Admitting: Pharmacist

## 2023-05-17 NOTE — Telephone Encounter (Signed)
Per [employer] portal, patient due for Humira PA renewal. Submitted a Prior Authorization request to [employer] for HUMIRA via CoverMyMeds. Will update once we receive a response.  Key: Kari Williams

## 2023-05-17 NOTE — Telephone Encounter (Signed)
Received notification from [employer] regarding a prior authorization for HUMIRA. Authorization has been APPROVED from 04/17/2023 to 05/16/2024.  Authorization # [id number]  Chesley Mires, PharmD, MPH, BCPS, CPP Clinical Pharmacist (Rheumatology and Pulmonology)

## 2023-05-19 ENCOUNTER — Other Ambulatory Visit (INDEPENDENT_AMBULATORY_CARE_PROVIDER_SITE_OTHER): Payer: Self-pay | Admitting: Internal Medicine

## 2023-05-19 DIAGNOSIS — R7303 Prediabetes: Secondary | ICD-10-CM

## 2023-05-23 ENCOUNTER — Ambulatory Visit (INDEPENDENT_AMBULATORY_CARE_PROVIDER_SITE_OTHER): Payer: BC Managed Care – PPO | Admitting: Internal Medicine

## 2023-05-23 ENCOUNTER — Encounter (INDEPENDENT_AMBULATORY_CARE_PROVIDER_SITE_OTHER): Payer: Self-pay | Admitting: Internal Medicine

## 2023-05-23 ENCOUNTER — Other Ambulatory Visit (HOSPITAL_BASED_OUTPATIENT_CLINIC_OR_DEPARTMENT_OTHER): Payer: Self-pay

## 2023-05-23 VITALS — BP 132/81 | HR 90 | Temp 98.3°F | Ht 63.0 in | Wt 177.0 lb

## 2023-05-23 DIAGNOSIS — R7303 Prediabetes: Secondary | ICD-10-CM

## 2023-05-23 DIAGNOSIS — E66811 Obesity, class 1: Secondary | ICD-10-CM

## 2023-05-23 DIAGNOSIS — Z9189 Other specified personal risk factors, not elsewhere classified: Secondary | ICD-10-CM

## 2023-05-23 DIAGNOSIS — E669 Obesity, unspecified: Secondary | ICD-10-CM | POA: Diagnosis not present

## 2023-05-23 DIAGNOSIS — Z6830 Body mass index (BMI) 30.0-30.9, adult: Secondary | ICD-10-CM

## 2023-05-23 DIAGNOSIS — E7800 Pure hypercholesterolemia, unspecified: Secondary | ICD-10-CM | POA: Diagnosis not present

## 2023-05-23 MED ORDER — TIRZEPATIDE-WEIGHT MANAGEMENT 2.5 MG/0.5ML ~~LOC~~ SOAJ
2.5000 mg | SUBCUTANEOUS | 0 refills | Status: DC
Start: 2023-05-23 — End: 2023-06-20
  Filled 2023-05-23: qty 2, 28d supply, fill #0

## 2023-05-23 MED ORDER — METFORMIN HCL ER 500 MG PO TB24
500.0000 mg | ORAL_TABLET | Freq: Every day | ORAL | 0 refills | Status: DC
Start: 2023-05-23 — End: 2023-06-20

## 2023-05-23 NOTE — Assessment & Plan Note (Signed)
Kari Williams has been working hard making lifestyle changes since November 2023 but is having difficulties losing weight.  She would benefit from antiobesity medications to maintain a reduced caloric state and help support her weight loss efforts.  After discussion of treatment options she is agreeable to starting Zepbound 2.5 mg once a week.  Please refer to obesity treatment plan for additional discussion.

## 2023-05-23 NOTE — Assessment & Plan Note (Signed)
A total of 10 minutes were spent counseling patient on increased cardiovascular risk.  She has a history of high blood pressure, hypercholesterolemia, obesity and rheumatological disease.  All of these conditions increase her risk of a cardiovascular event.  She also has family history of a stroke in her father at the age of 44.  Her 10-year cardiovascular risk was calculated at 11.5%.  We discussed ways to reduce cardiovascular risk including lipid-lowering medication.  She has not discussed this with her primary care physician.  I recommend that she get more information from the American Heart Association as well as [employer] of cardiology.

## 2023-05-23 NOTE — Assessment & Plan Note (Signed)
Her most recent LDL is from November of last year was 160 range, she is not on statin therapy and this has not been discussed with her primary care physician.  We will repeat fasting lipid profile at the next office visit.  Patient was also educated on cardiovascular risk and risk reduction during this visit.

## 2023-05-23 NOTE — Progress Notes (Signed)
Office: [phone]  /  Fax: [phone]  WEIGHT SUMMARY AND BIOMETRICS  Vitals Temp: 98.3 F (36.8 C) BP: 132/81 Pulse Rate: 90 SpO2: 100 %   Anthropometric Measurements Height: 5\' 3"  (1.6 m) Weight: 177 lb (80.3 kg) BMI (Calculated): 31.36 Weight at Last Visit: 177 lb Weight Lost Since Last Visit: 0 lb Weight Gained Since Last Visit: 0 lb Starting Weight: 180 lb Total Weight Loss (lbs): 3 lb (1.361 kg) Peak Weight: 230 lb   Body Composition  Body Fat %: 35.3 % Fat Mass (lbs): 62.8 lbs Muscle Mass (lbs): 109.2 lbs Total Body Water (lbs): 74.8 lbs Visceral Fat Rating : 10   The 10-year ASCVD risk score (Arnett DK, et al., 2019) is: 11.5%  No data recorded Today's Visit #: 12  Starting Date: 06/27/22   HPI  Chief Complaint: OBESITY  Kari Williams is here to discuss her progress with her obesity treatment plan. She is on the the Category 2 Plan and states she is following her eating plan approximately 85 % of the time. She states she is exercising 65 minutes 7 times per week.  Interval History:  Since last office visit she has maintained weight. She reports good adherence to reduced calorie nutritional plan. She has been working on reading food labels, not skipping meals, increasing protein intake at every meal, eating more fruits, eating more vegetables, drinking more water, making healthier choices, continues to exercise, reducing portion sizes, and incorporating more whole foods Last office visit we had also recommended Zepbound 2.5 mg once a week, but she did not receive notification from the pharmacy.  Our staff found information regarding require prior authorization today.  Orexigenic Control: Reports problems with appetite and hunger signals.  Reports problems with satiety and satiation.  Denies problems with eating patterns and portion control.  Denies abnormal cravings. Denies feeling deprived or restricted.   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 .   Pharmacotherapy for weight loss: She is currently taking Metformin (off label use for incretin effect and / or insulin resistance and / or diabetes prevention) with adequate clinical response  and without side effects..    ASSESSMENT AND PLAN  TREATMENT PLAN FOR OBESITY:  Recommended Dietary Goals  Kari Williams is currently in the action stage of change. As such, her goal is to continue weight management plan. She has agreed to: continue current plan  Behavioral Intervention  We discussed the following Behavioral Modification Strategies today: continue to work on maintaining a reduced calorie state, getting the recommended amount of protein, incorporating whole foods, making healthy choices, staying well hydrated and practicing mindfulness when eating..  Additional resources provided today: None  Recommended Physical Activity Goals  Kari Williams has been advised to work up to 150 minutes of moderate intensity aerobic activity a week and strengthening exercises 2-3 times per week for cardiovascular health, weight loss maintenance and preservation of muscle mass.   She has agreed to :  To continue to work with fitness trainer and increase volume of physical activity  Pharmacotherapy We discussed various medication options to help Kari Williams with her weight loss efforts and we both agreed to : start anti-obesity medication.  In addition to reduced calorie nutrition plan (RCNP), behavioral strategies and physical activity, Kari Williams would benefit from pharmacotherapy to assist with hunger signals, satiety and cravings. This will reduce obesity-related health risks by inducing weight loss, and help reduce food consumption and adherence to [facility]) .  It may also improve QOL by improving self-confidence and reduce the  setbacks associated with metabolic adaptations.  After discussion of treatment options,  mechanisms of action, benefits, side effects, contraindications and shared decision making she is agreeable to starting Zepbound 2.5 mg once a week. Patient also made aware that medication is indicated for long-term management of obesity and the risk of weight regain following discontinuation of treatment and hence the importance of adhering to medical weight loss plan.  We demonstrated use of device and patient using teach back method was able to demonstrate proper technique.  ASSOCIATED CONDITIONS ADDRESSED TODAY  At increased risk for cardiovascular disease Assessment & Plan: A total of 10 minutes were spent counseling patient on increased cardiovascular risk.  She has a history of high blood pressure, hypercholesterolemia, obesity and rheumatological disease.  All of these conditions increase her risk of a cardiovascular event.  She also has family history of a stroke in her father at the age of 30.  Her 10-year cardiovascular risk was calculated at 11.5%.  We discussed ways to reduce cardiovascular risk including lipid-lowering medication.  She has not discussed this with her primary care physician.  I recommend that she get more information from the American Heart Association as well as [employer] of cardiology.    Class 1 obesity with serious comorbidity and body mass index (BMI) of 30.0 to 30.9 in adult, unspecified obesity type Assessment & Plan: Kari Williams has been working hard making lifestyle changes since November 2023 but is having difficulties losing weight.  She would benefit from antiobesity medications to maintain a reduced caloric state and help support her weight loss efforts.  After discussion of treatment options she is agreeable to starting Zepbound 2.5 mg once a week.  Please refer to obesity treatment plan for additional discussion.  Orders: -     Tirzepatide-Weight Management; Inject 2.5 mg into the skin once a week.  Dispense: 2 mL; Refill: 0  Prediabetes Assessment &  Plan: Her most recent A1c was 6.0 and improved from 6.4, she is having some side effects from metformin twice a day we will therefore reduce to 1 tablet daily.  She continues to maintain a reduced calorie state but weight loss has plateaued.  She would benefit from incretin therapy for pharmacoprophylaxis and assistance with weight management.  After discussion of benefits and side effect she will be started on Zepbound 2.5 mg once a week  Orders: -     metFORMIN HCl ER; Take 1 tablet (500 mg total) by mouth daily with breakfast.  Dispense: 90 tablet; Refill: 0 -     Tirzepatide-Weight Management; Inject 2.5 mg into the skin once a week.  Dispense: 2 mL; Refill: 0  Hypercholesteremia Assessment & Plan: Her most recent LDL is from November of last year was 160 range, she is not on statin therapy and this has not been discussed with her primary care physician.  We will repeat fasting lipid profile at the next office visit.  Patient was also educated on cardiovascular risk and risk reduction during this visit.     PHYSICAL EXAM:  Blood pressure 132/81, pulse 90, temperature 98.3 F (36.8 C), height 5\' 3"  (1.6 m), weight 177 lb (80.3 kg), SpO2 100%. Body mass index is 31.35 kg/m.  General: She is overweight, cooperative, alert, well developed, and in no acute distress. PSYCH: Has normal mood, affect and thought process.   HEENT: EOMI, sclerae are anicteric. Lungs: Normal breathing effort, no conversational dyspnea. Extremities: No edema.  Neurologic: No gross sensory or motor deficits. No tremors or fasciculations noted.    DIAGNOSTIC DATA REVIEWED:  BMET    Component Value Date/Time   NA 142 05/02/2023 1101   NA 142 06/27/2022 1007   K 4.6 05/02/2023 1101   CL 105 05/02/2023 1101   CO2 29 05/02/2023 1101   GLUCOSE 87 05/02/2023 1101   BUN 10 05/02/2023 1101   BUN 13 06/27/2022 1007   CREATININE 0.87 05/02/2023 1101   CALCIUM 9.8 05/02/2023 1101   GFRNONAA 66 12/07/2020 1518    GFRAA 77 12/07/2020 1518   Lab Results  Component Value Date   HGBA1C 6.0 (H) 05/02/2023   HGBA1C 6.4 (H) 06/27/2022   Lab Results  Component Value Date   INSULIN 23.1 06/27/2022   Lab Results  Component Value Date   TSH 1.070 06/27/2022   CBC    Component Value Date/Time   WBC 7.4 05/02/2023 1101   RBC 3.92 05/02/2023 1101   HGB 12.9 05/02/2023 1101   HCT 39.3 05/02/2023 1101   PLT 350 05/02/2023 1101   MCV 100.3 (H) 05/02/2023 1101   MCH 32.9 05/02/2023 1101   MCHC 32.8 05/02/2023 1101   RDW 12.5 05/02/2023 1101   Iron Studies No results found for: "IRON", "TIBC", "FERRITIN", "IRONPCTSAT" Lipid Panel     Component Value Date/Time   CHOL 234 (H) 06/27/2022 1007   TRIG 144 06/27/2022 1007   HDL 43 06/27/2022 1007   LDLCALC 165 (H) 06/27/2022 1007   Hepatic Function Panel     Component Value Date/Time   PROT 7.1 05/02/2023 1101   PROT 7.8 06/27/2022 1007   ALBUMIN 4.5 06/27/2022 1007   AST 21 05/02/2023 1101   ALT 18 05/02/2023 1101   ALKPHOS 88 06/27/2022 1007   BILITOT 0.4 05/02/2023 1101   BILITOT 0.5 06/27/2022 1007      Component Value Date/Time   TSH 1.070 06/27/2022 1007   Nutritional Lab Results  Component Value Date   VD25OH 51.7 10/18/2022   VD25OH 11.4 (L) 06/27/2022     Return in about 4 weeks (around 06/20/2023) for For Weight Mangement with Dr. Rikki Spearing.Marland Kitchen She was informed of the importance of frequent follow up visits to maximize her success with intensive lifestyle modifications for her multiple health conditions.   ATTESTASTION STATEMENTS:  Reviewed by clinician on day of visit: allergies, medications, problem list, medical history, surgical history, family history, social history, and previous encounter notes.     Worthy Rancher, MD

## 2023-05-23 NOTE — Assessment & Plan Note (Signed)
Her most recent A1c was 6.0 and improved from 6.4, she is having some side effects from metformin twice a day we will therefore reduce to 1 tablet daily.  She continues to maintain a reduced calorie state but weight loss has plateaued.  She would benefit from incretin therapy for pharmacoprophylaxis and assistance with weight management.  After discussion of benefits and side effect she will be started on Zepbound 2.5 mg once a week

## 2023-05-24 ENCOUNTER — Other Ambulatory Visit (HOSPITAL_BASED_OUTPATIENT_CLINIC_OR_DEPARTMENT_OTHER): Payer: Self-pay

## 2023-05-27 ENCOUNTER — Telehealth (INDEPENDENT_AMBULATORY_CARE_PROVIDER_SITE_OTHER): Payer: Self-pay

## 2023-05-27 NOTE — Telephone Encounter (Signed)
PA for Zepbound has been approved.  Outcome Approved on October 24 by [employer] 2017 CaseId:[id number];Status:Approved;Review Type:Prior Auth;Coverage Start Date:04/23/2023;Coverage End Date:01/18/2024; Authorization Expiration Date: 01/17/2024  Pt. notified via MyChart.

## 2023-05-28 ENCOUNTER — Other Ambulatory Visit (HOSPITAL_BASED_OUTPATIENT_CLINIC_OR_DEPARTMENT_OTHER): Payer: Self-pay

## 2023-05-28 MED ORDER — INFLUENZA VIRUS VACC SPLIT PF (FLUZONE) 0.5 ML IM SUSY
0.5000 mL | PREFILLED_SYRINGE | Freq: Once | INTRAMUSCULAR | 0 refills | Status: AC
  Filled 2023-05-28: qty 0.5, 1d supply, fill #0

## 2023-06-20 ENCOUNTER — Ambulatory Visit (INDEPENDENT_AMBULATORY_CARE_PROVIDER_SITE_OTHER): Payer: BC Managed Care – PPO | Admitting: Internal Medicine

## 2023-06-20 ENCOUNTER — Encounter (INDEPENDENT_AMBULATORY_CARE_PROVIDER_SITE_OTHER): Payer: Self-pay | Admitting: Internal Medicine

## 2023-06-20 ENCOUNTER — Other Ambulatory Visit (HOSPITAL_BASED_OUTPATIENT_CLINIC_OR_DEPARTMENT_OTHER): Payer: Self-pay

## 2023-06-20 VITALS — BP 123/78 | HR 88 | Temp 98.0°F | Ht 63.0 in | Wt 173.0 lb

## 2023-06-20 DIAGNOSIS — E669 Obesity, unspecified: Secondary | ICD-10-CM

## 2023-06-20 DIAGNOSIS — R7303 Prediabetes: Secondary | ICD-10-CM | POA: Diagnosis not present

## 2023-06-20 DIAGNOSIS — I10 Essential (primary) hypertension: Secondary | ICD-10-CM

## 2023-06-20 DIAGNOSIS — Z6830 Body mass index (BMI) 30.0-30.9, adult: Secondary | ICD-10-CM

## 2023-06-20 DIAGNOSIS — E66811 Obesity, class 1: Secondary | ICD-10-CM

## 2023-06-20 DIAGNOSIS — R110 Nausea: Secondary | ICD-10-CM | POA: Diagnosis not present

## 2023-06-20 MED ORDER — ONDANSETRON 4 MG PO TBDP
4.0000 mg | ORAL_TABLET | Freq: Three times a day (TID) | ORAL | 0 refills | Status: DC | PRN
  Filled 2023-06-20: qty 9, 15d supply, fill #0
  Filled 2023-07-02: qty 27, 9d supply, fill #0

## 2023-06-20 MED ORDER — TIRZEPATIDE-WEIGHT MANAGEMENT 2.5 MG/0.5ML ~~LOC~~ SOAJ
2.5000 mg | SUBCUTANEOUS | 0 refills | Status: DC
Start: 2023-06-20 — End: 2023-08-01
  Filled 2023-06-20 – 2023-07-02 (×5): qty 2, 28d supply, fill #0

## 2023-06-20 NOTE — Progress Notes (Signed)
Office: [phone]  /  Fax: [phone]  Weight Summary And Biometrics  Vitals Temp: 98 F (36.7 C) BP: 123/78 Pulse Rate: 88 SpO2: 98 %   Anthropometric Measurements Height: 5\' 3"  (1.6 m) Weight: 173 lb (78.5 kg) BMI (Calculated): 30.65 Weight at Last Visit: 177 lb Weight Lost Since Last Visit: 4 lb Weight Gained Since Last Visit: 0 lb Starting Weight: 180 lb Total Weight Loss (lbs): 7 lb (3.175 kg) Peak Weight: 230 lb   Body Composition  Body Fat %: 33.8 % Fat Mass (lbs): 58.6 lbs Muscle Mass (lbs): 109 lbs Total Body Water (lbs): 72.6 lbs Visceral Fat Rating : 9    No data recorded Today's Visit #: 13  Starting Date: 06/27/22   Subjective   Chief Complaint: Obesity  Kari Williams is here to discuss her progress with her obesity treatment plan. She is on the the Category 2 Plan and states she is following her eating plan approximately 90 % of the time. She states she is exercising 75 minutes 7 times per week.  Interval History:    Discussed the use of AI scribe software for clinical note transcription with the patient, who gave verbal consent to proceed.  History of Present Illness   Kari Williams, a patient with a history of prediabetes and hypertension, presents for weight management. Recently, she was started on Zepbound 2.5 mg once a week and she is also on metformin XR 500mg  once daily. She reports persistent nausea, particularly after eating, since starting Zepbound. The nausea is not severe enough to induce vomiting, but it is constant and particularly noticeable after meals. She reports feeling full quickly, often unable to finish even half of the recommended portion sizes. Despite the discomfort, she has completed all four prescribed shots of medication.  She has noticed a decrease in hunger and a reduction in portion sizes since starting the medication. However, the nausea has not improved over the four weeks of treatment. She has tried various dietary  modifications, including reducing sugar intake and switching from stevia to honey in her daily cup of coffee, but these changes have not alleviated the nausea.  She has also been maintaining an active lifestyle, engaging in regular exercise including weight training at [employer]. Despite the persistent nausea, she has noticed a decrease in body fat percentage and a weight loss of four pounds over the past four weeks.   Orexigenic Control:  Denies problems with appetite and hunger signals.  Denies problems with satiety and satiation.  Denies problems with eating patterns and portion control.  Denies abnormal cravings. Denies feeling deprived or restricted.   Barriers identified:  Side effects to GLP-1 .   Pharmacotherapy for weight loss: She is currently taking Zepbound with adequate clinical response  and experiencing the following side effects: Nausea, moderate..   Assessment and Plan   Treatment Plan For Obesity:  Recommended Dietary Goals  Kari Williams is currently in the action stage of change. As such, her goal is to continue weight management plan. She has agreed to: continue current plan  Behavioral Intervention  We discussed the following Behavioral Modification Strategies today: continue to work on maintaining a reduced calorie state, getting the recommended amount of protein, incorporating whole foods, making healthy choices, staying well hydrated and practicing mindfulness when eating..  Additional resources provided today: None and Handout on traveling and holiday eating strategies  Recommended Physical Activity Goals  Kari Williams has been advised to work up to 150 minutes of moderate intensity aerobic activity a week and  strengthening exercises 2-3 times per week for cardiovascular health, weight loss maintenance and preservation of muscle mass.   She has agreed to :  Continue current level of physical activity   Pharmacotherapy  We discussed various medication options to  help Kari Williams with her weight loss efforts and we both agreed to :  After lengthy discussion about side effect management we have decided to discontinue metformin try Zepbound for another 2 weeks and using Zofran as needed for nausea.  We also discussed nonpharmacological strategies to reduce the risk of GI side effects.  Associated Conditions Addressed Today  Assessment and Plan    Weight Management   Kari Williams has been on Zepbound for four weeks and reports significant nausea, particularly after eating, which has not improved. Despite the nausea, she has experienced decreased hunger and portion sizes, leading to weight loss and a 4% reduction in body fat. Kari Williams is also engaging in regular physical activity, including walking and weight training at [employer]. The nausea is likely due to delayed gastric emptying caused by the medication. Although side effects usually subside after one to two weeks, Kari Williams's symptoms have persisted. We discussed options for managing nausea, including ginger ale, peppermint oil, or Zofran. Kari Williams prefers to try Zofran and continue Zepbound at current dose for another four weeks. We will discontinue metformin to assess if symptoms improve.   Nausea -GLP-1 related The nausea is likely due to delayed gastric emptying caused by the medication. Although side effects usually subside after one to two weeks, Kari Williams's symptoms have persisted. We discussed options for managing nausea, including ginger ale, peppermint oil, or Zofran. Kari Williams prefers to try Zofran and continue Zepbound at current dose for another four weeks. We will discontinue metformin to assess if symptoms improve.   Prediabetes   Kari Williams was taking metformin XR 500 mg once daily for prediabetes. Due to the current gastrointestinal side effects, we will stop metformin to evaluate its contribution to the nausea.  Hypertension   Her blood pressure remains well-controlled.  She is on amlodipine, losartan without  any adverse effects.  General Health Maintenance   Kari Williams's engagement in regular physical activity, including walking and weight training, is beneficial for her overall health and weight management. We encourage Kari Williams to continue these activities at [facility].  Follow-up   We will follow up in three weeks. Blessyn should reach out via the portal if symptoms persist or worsen.             Objective   Physical Exam:  Blood pressure 123/78, pulse 88, temperature 98 F (36.7 C), height 5\' 3"  (1.6 m), weight 173 lb (78.5 kg), SpO2 98%. Body mass index is 30.65 kg/m.  General: She is overweight, cooperative, alert, well developed, and in no acute distress. PSYCH: Has normal mood, affect and thought process.   HEENT: EOMI, sclerae are anicteric. Lungs: Normal breathing effort, no conversational dyspnea. Extremities: No edema.  Neurologic: No gross sensory or motor deficits. No tremors or fasciculations noted.    Diagnostic Data Reviewed:  BMET    Component Value Date/Time   NA 142 05/02/2023 1101   NA 142 06/27/2022 1007   K 4.6 05/02/2023 1101   CL 105 05/02/2023 1101   CO2 29 05/02/2023 1101   GLUCOSE 87 05/02/2023 1101   BUN 10 05/02/2023 1101   BUN 13 06/27/2022 1007   CREATININE 0.87 05/02/2023 1101   CALCIUM 9.8 05/02/2023 1101   GFRNONAA 66 12/07/2020 1518   GFRAA 77 12/07/2020 1518   Lab  Results  Component Value Date   HGBA1C 6.0 (H) 05/02/2023   HGBA1C 6.4 (H) 06/27/2022   Lab Results  Component Value Date   INSULIN 23.1 06/27/2022   Lab Results  Component Value Date   TSH 1.070 06/27/2022   CBC    Component Value Date/Time   WBC 7.4 05/02/2023 1101   RBC 3.92 05/02/2023 1101   HGB 12.9 05/02/2023 1101   HCT 39.3 05/02/2023 1101   PLT 350 05/02/2023 1101   MCV 100.3 (H) 05/02/2023 1101   MCH 32.9 05/02/2023 1101   MCHC 32.8 05/02/2023 1101   RDW 12.5 05/02/2023 1101   Iron Studies No results found for: "IRON", "TIBC", "FERRITIN",  "IRONPCTSAT" Lipid Panel     Component Value Date/Time   CHOL 234 (H) 06/27/2022 1007   TRIG 144 06/27/2022 1007   HDL 43 06/27/2022 1007   LDLCALC 165 (H) 06/27/2022 1007   Hepatic Function Panel     Component Value Date/Time   PROT 7.1 05/02/2023 1101   PROT 7.8 06/27/2022 1007   ALBUMIN 4.5 06/27/2022 1007   AST 21 05/02/2023 1101   ALT 18 05/02/2023 1101   ALKPHOS 88 06/27/2022 1007   BILITOT 0.4 05/02/2023 1101   BILITOT 0.5 06/27/2022 1007      Component Value Date/Time   TSH 1.070 06/27/2022 1007   Nutritional Lab Results  Component Value Date   VD25OH 51.7 10/18/2022   VD25OH 11.4 (L) 06/27/2022    Follow-Up   Return in about 3 weeks (around 07/11/2023) for For Weight Mangement with Dr. Rikki Spearing.Marland Kitchen She was informed of the importance of frequent follow up visits to maximize her success with intensive lifestyle modifications for her multiple health conditions.  Attestation Statement   Reviewed by clinician on day of visit: allergies, medications, problem list, medical history, surgical history, family history, social history, and previous encounter notes.     Worthy Rancher, MD

## 2023-06-21 ENCOUNTER — Other Ambulatory Visit (HOSPITAL_BASED_OUTPATIENT_CLINIC_OR_DEPARTMENT_OTHER): Payer: Self-pay

## 2023-06-24 ENCOUNTER — Telehealth (INDEPENDENT_AMBULATORY_CARE_PROVIDER_SITE_OTHER): Payer: Self-pay

## 2023-06-24 NOTE — Telephone Encounter (Signed)
Prior auth started for Zepbound.  Awaiting questions.   Per the insurance, Clinical Override not needed.

## 2023-06-25 ENCOUNTER — Other Ambulatory Visit (HOSPITAL_BASED_OUTPATIENT_CLINIC_OR_DEPARTMENT_OTHER): Payer: Self-pay

## 2023-06-25 NOTE — Telephone Encounter (Signed)
Call to insurance company for a plan limitations PA. It was approved on 07/25/2023.

## 2023-06-25 NOTE — Telephone Encounter (Signed)
BIN: [id number]  PCN: [id number]: [id number] ID: [id number]   [phone]

## 2023-06-26 ENCOUNTER — Telehealth (INDEPENDENT_AMBULATORY_CARE_PROVIDER_SITE_OTHER): Payer: Self-pay

## 2023-06-26 NOTE — Telephone Encounter (Signed)
PA approved, Zepbound 7.5 mg, pt notified

## 2023-06-28 ENCOUNTER — Other Ambulatory Visit: Payer: Self-pay | Admitting: Internal Medicine

## 2023-06-28 DIAGNOSIS — H2013 Chronic iridocyclitis, bilateral: Secondary | ICD-10-CM

## 2023-06-28 DIAGNOSIS — H15003 Unspecified scleritis, bilateral: Secondary | ICD-10-CM

## 2023-07-01 ENCOUNTER — Other Ambulatory Visit (HOSPITAL_BASED_OUTPATIENT_CLINIC_OR_DEPARTMENT_OTHER): Payer: Self-pay

## 2023-07-01 NOTE — Telephone Encounter (Signed)
Last Fill: 04/02/2023  Labs: 05/02/2023 Sedimentation rate remains normal so no problem with inflammation.  Kidney and liver function are normal.  Your blood count shows a small increase in the MCV again up to 100.3.  Next Visit: 08/22/2023  Last Visit: 05/02/2023  DX: Bilateral scleritis   Current Dose per lab note 05/02/2023: decrease the methotrexate down to 10 mg (4 tablets) weekly  Okay to refill Methotrexate?

## 2023-07-02 ENCOUNTER — Other Ambulatory Visit (HOSPITAL_BASED_OUTPATIENT_CLINIC_OR_DEPARTMENT_OTHER): Payer: Self-pay

## 2023-07-29 ENCOUNTER — Other Ambulatory Visit: Payer: Self-pay | Admitting: Internal Medicine

## 2023-07-29 NOTE — Telephone Encounter (Signed)
Last Fill: 05/13/2023  Labs: 05/02/2023 Sedimentation rate remains normal so no problem with inflammation.  Kidney and liver function are normal.  Your blood count shows a small increase in the MCV again up to 100.3.  I think should be okay to decrease the methotrexate down to 10 mg (4 tablets) weekly. Hemoglobin A1c is 6.0%.  I am suspicious she might be on more blood sugar lowering medication then she needs to be also dimensions to her primary care doctor and she can contact them to check. But I think she might benefit to decrease her metformin or try interrupting it to see if the morning symptoms resolved.  TB Gold: 05/02/2023 Negative  Next Visit: 08/22/2023  Last Visit: 05/02/2023  DX: Bilateral scleritis   Current Dose per office note 05/02/2023: Humira 40 mg subcu q. 14 days   Okay to refill Humira?

## 2023-08-01 ENCOUNTER — Ambulatory Visit (INDEPENDENT_AMBULATORY_CARE_PROVIDER_SITE_OTHER): Payer: BC Managed Care – PPO | Admitting: Internal Medicine

## 2023-08-01 ENCOUNTER — Other Ambulatory Visit (HOSPITAL_BASED_OUTPATIENT_CLINIC_OR_DEPARTMENT_OTHER): Payer: Self-pay

## 2023-08-01 ENCOUNTER — Encounter (INDEPENDENT_AMBULATORY_CARE_PROVIDER_SITE_OTHER): Payer: Self-pay | Admitting: Internal Medicine

## 2023-08-01 VITALS — BP 126/72 | HR 75 | Temp 98.0°F | Ht 63.0 in | Wt 172.0 lb

## 2023-08-01 DIAGNOSIS — I10 Essential (primary) hypertension: Secondary | ICD-10-CM

## 2023-08-01 DIAGNOSIS — E66811 Obesity, class 1: Secondary | ICD-10-CM | POA: Diagnosis not present

## 2023-08-01 DIAGNOSIS — R7303 Prediabetes: Secondary | ICD-10-CM | POA: Diagnosis not present

## 2023-08-01 DIAGNOSIS — Z6830 Body mass index (BMI) 30.0-30.9, adult: Secondary | ICD-10-CM | POA: Diagnosis not present

## 2023-08-01 MED ORDER — TIRZEPATIDE-WEIGHT MANAGEMENT 5 MG/0.5ML ~~LOC~~ SOAJ
5.0000 mg | SUBCUTANEOUS | 0 refills | Status: DC
  Filled 2023-08-01: qty 2, 28d supply, fill #0

## 2023-08-01 NOTE — Assessment & Plan Note (Signed)
 Blood pressure well-controlled.  She is currently on amlodipine 10 mg a day and losartan 100 mg.  Continue current regimen.  Monitor for orthostasis while losing weight

## 2023-08-01 NOTE — Progress Notes (Signed)
 Office: [phone]  /  Fax: [phone]  Weight Summary And Biometrics  Vitals Temp: 98 F (36.7 C) BP: 126/72 Pulse Rate: 75 SpO2: 100 %   Anthropometric Measurements Height: 5' 3 (1.6 m) Weight: 172 lb (78 kg) BMI (Calculated): 30.48 Weight at Last Visit: 173 lb Weight Lost Since Last Visit: 1 lb Weight Gained Since Last Visit: 0 Starting Weight: 180 lb Total Weight Loss (lbs): 8 lb (3.629 kg) Peak Weight: 230 lb   Body Composition  Body Fat %: 34.1 % Fat Mass (lbs): 58.6 lbs Muscle Mass (lbs): 107.6 lbs Total Body Water (lbs): 72.2 lbs Visceral Fat Rating : 9    No data recorded Today's Visit #: 14  Starting Date: 06/27/22   Subjective   Chief Complaint: Obesity  Kari Williams is here to discuss her progress with her obesity treatment plan. She is on the the Category 2 Plan and states she is following her eating plan approximately 90 % of the time. She states she is exercising 60-120 minutes 7 times per week. She is walking everyday and on Tuesdays and Thursdays she goes to [facility] to do weights.   Interval History:   Since last office visit she has lost 1 pounds. She reports good adherence to reduced calorie nutritional plan. She has been working on reading food labels, not skipping meals, increasing protein intake at every meal, drinking more water, making healthier choices, reducing portion sizes, and incorporating more whole foods   Sleeping approximately 5 hours a day.  Sleep described as interrupted.   Stress levels are reported as low and manageable.   Orexigenic Control:  Denies problems with appetite and hunger signals.  Denies problems with satiety and satiation.  Denies problems with eating patterns and portion control.  Denies abnormal cravings. Denies feeling deprived or restricted.   Barriers identified: inadequate sleep.   Pharmacotherapy for weight loss: She is currently taking Zepbound  with adequate clinical response  and  without side effects..   Assessment and Plan   Treatment Plan For Obesity:  Recommended Dietary Goals  Kari Williams is currently in the action stage of change. As such, her goal is to continue weight management plan. She has agreed to: continue current plan  Behavioral Intervention  We discussed the following Behavioral Modification Strategies today: continue to work on maintaining a reduced calorie state, getting the recommended amount of protein, incorporating whole foods, making healthy choices, staying well hydrated and practicing mindfulness when eating..  Additional resources provided today: None  Recommended Physical Activity Goals  Kari Williams has been advised to work up to 150 minutes of moderate intensity aerobic activity a week and strengthening exercises 2-3 times per week for cardiovascular health, weight loss maintenance and preservation of muscle mass.   She has agreed to :  Think about enjoyable ways to increase daily physical activity and overcoming barriers to exercise and Increase physical activity in their day and reduce sedentary time (increase NEAT).  Pharmacotherapy  We discussed various medication options to help Kari Williams with her weight loss efforts and we both agreed to : increase Zepbound  to 5 mg once a week  Associated Conditions Addressed and Impacted by Obesity Treatment  Primary hypertension Assessment & Plan:  Blood pressure well-controlled.  She is currently on amlodipine  10 mg a day and losartan 100 mg.  Continue current regimen.  Monitor for orthostasis while losing weight   Class 1 obesity with serious comorbidity and body mass index (BMI) of 30.0 to 30.9 in adult, unspecified obesity type Assessment &  Plan: Kari Williams has been working hard making lifestyle changes since November 2023 but is having difficulties losing weight.  She benefits from antiobesity medications to maintain a reduced caloric state and help support her weight loss efforts.  She is  currently on Zepbound  2.5 mg once a week without any adverse effects.  Medication will be increased to 5 mg once weekly for maintenance.  Orders: -     Tirzepatide -Weight Management; Inject 5 mg into the skin once a week.  Dispense: 2 mL; Refill: 0  Prediabetes Assessment & Plan: Her most recent A1c was 6.0 and improved from 6.4, she is having some side effects from metformin  we will therefore discontinue medication.  She is also now on Zepbound  for pharmacoprophylaxis and weight management.  She will continue GLP-1 therapy.  Patient has also been counseled on maintaining a diet with a low glycemic load.  Orders: -     Tirzepatide -Weight Management; Inject 5 mg into the skin once a week.  Dispense: 2 mL; Refill: 0     Objective   Physical Exam:  Blood pressure 126/72, pulse 75, temperature 98 F (36.7 C), height 5' 3 (1.6 m), weight 172 lb (78 kg), SpO2 100%. Body mass index is 30.47 kg/m.  General: She is overweight, cooperative, alert, well developed, and in no acute distress. PSYCH: Has normal mood, affect and thought process.   HEENT: EOMI, sclerae are anicteric. Lungs: Normal breathing effort, no conversational dyspnea. Extremities: No edema.  Neurologic: No gross sensory or motor deficits. No tremors or fasciculations noted.    Diagnostic Data Reviewed:  BMET    Component Value Date/Time   NA 142 05/02/2023 1101   NA 142 06/27/2022 1007   K 4.6 05/02/2023 1101   CL 105 05/02/2023 1101   CO2 29 05/02/2023 1101   GLUCOSE 87 05/02/2023 1101   BUN 10 05/02/2023 1101   BUN 13 06/27/2022 1007   CREATININE 0.87 05/02/2023 1101   CALCIUM 9.8 05/02/2023 1101   GFRNONAA 66 12/07/2020 1518   GFRAA 77 12/07/2020 1518   Lab Results  Component Value Date   HGBA1C 6.0 (H) 05/02/2023   HGBA1C 6.4 (H) 06/27/2022   Lab Results  Component Value Date   INSULIN  23.1 06/27/2022   Lab Results  Component Value Date   TSH 1.070 06/27/2022   CBC    Component Value  Date/Time   WBC 7.4 05/02/2023 1101   RBC 3.92 05/02/2023 1101   HGB 12.9 05/02/2023 1101   HCT 39.3 05/02/2023 1101   PLT 350 05/02/2023 1101   MCV 100.3 (H) 05/02/2023 1101   MCH 32.9 05/02/2023 1101   MCHC 32.8 05/02/2023 1101   RDW 12.5 05/02/2023 1101   Iron Studies No results found for: IRON, TIBC, FERRITIN, IRONPCTSAT Lipid Panel     Component Value Date/Time   CHOL 234 (H) 06/27/2022 1007   TRIG 144 06/27/2022 1007   HDL 43 06/27/2022 1007   LDLCALC 165 (H) 06/27/2022 1007   Hepatic Function Panel     Component Value Date/Time   PROT 7.1 05/02/2023 1101   PROT 7.8 06/27/2022 1007   ALBUMIN 4.5 06/27/2022 1007   AST 21 05/02/2023 1101   ALT 18 05/02/2023 1101   ALKPHOS 88 06/27/2022 1007   BILITOT 0.4 05/02/2023 1101   BILITOT 0.5 06/27/2022 1007      Component Value Date/Time   TSH 1.070 06/27/2022 1007   Nutritional Lab Results  Component Value Date   VD25OH 51.7 10/18/2022   VD25OH 11.4 (  L) 06/27/2022    Follow-Up   Return in about 4 weeks (around 08/29/2023) for For Weight Mangement with Dr. [email or web address].[email or web address] She was informed of the importance of frequent follow up visits to maximize her success with intensive lifestyle modifications for her multiple health conditions.  Attestation Statement   Reviewed by clinician on day of visit: allergies, medications, problem list, medical history, surgical history, family history, social history, and previous encounter notes.     Lucas Francyne, MD

## 2023-08-01 NOTE — Assessment & Plan Note (Signed)
 Kari Williams has been working hard making lifestyle changes since November 2023 but is having difficulties losing weight.  She benefits from antiobesity medications to maintain a reduced caloric state and help support her weight loss efforts.  She is currently on Zepbound  2.5 mg once a week without any adverse effects.  Medication will be increased to 5 mg once weekly for maintenance.

## 2023-08-01 NOTE — Assessment & Plan Note (Signed)
 Her most recent A1c was 6.0 and improved from 6.4, she is having some side effects from metformin we will therefore discontinue medication.  She is also now on Zepbound for pharmacoprophylaxis and weight management.  She will continue GLP-1 therapy.  Patient has also been counseled on maintaining a diet with a low glycemic load.

## 2023-08-02 ENCOUNTER — Ambulatory Visit: Payer: BC Managed Care – PPO | Admitting: Internal Medicine

## 2023-08-08 NOTE — Progress Notes (Signed)
Office Visit Note  Patient: Kari Williams             Date of Birth: 03-Sep-1960           MRN: [id number]             PCP: Renford Dills, MD Referring: Renford Dills, MD Visit Date: 08/22/2023   Subjective:  Follow-up    Discussed the use of AI scribe software for clinical note transcription with the patient, who gave verbal consent to proceed.  History of Present Illness   Kari Williams is a 63 y.o. 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 14 days and methotrexate 10 mg p.o. weekly and folic acid 2 mg daily. They report a decrease in methotrexate dosage to four tablets as directed after last visit and have been tolerating the Zepbound and metformin well. The patient has been experiencing nausea with Zepbound, but it has improved over time. They have been actively managing their weight, eating smaller portions, and have joined a fitness center where they engage in weight training twice a week.  The patient also reports dry eyes, which they have been managing with heated patches. They have an upcoming appointment with Dr. Sherryll Burger to further evaluate this issue as well as monitoring her uveitis.  The patient was on Humira, but has recently switched to a biosimilar Simlandi since about 3 months ago. They have not noticed any significant changes since the switch.  The patient has been experiencing shoulder pain, which they believe is due to a torn rotator cuff. The pain is located in the front of the shoulder and radiates down the arm. They have been managing the pain with exercises, but have noticed that certain movements, particularly those involving overhead lifting, exacerbate the pain.  Previous HPI 05/02/2023 Kari Williams is a 63 y.o. 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 14 days and methotrexate 12.5 mg p.o. weekly and folic acid 2 mg daily.  No new changes with vision or episodes of eye  redness or pain.  No new joint pain complaints she has partial improvement with the previous right hip and gluteal area tenderness and stiffness she has been working on this with exercise.  Is also working with healthy weight and wellness clinic has not had much weight change.  She discussed starting Zepbound with her PCP she is currently on metformin twice daily.  Has a new problem especially for the past few weeks now with dizziness first thing in the mornings gets better when she is up and moving for a while and after breakfast.   Previous HPI 01/24/2023 Kari Williams is a 63 y.o. 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 14 days and methotrexate 12.5 mg p.o. weekly and folic acid 2 mg daily.  Most recent liver enzyme tests from last visit were elevated but mildly so I will trend for monitoring methotrexate.  No new eye pain or visual change complaints.  Has been having intermittent pain at the right hip comes and goes.  Often gets worse moving after sitting in a prolonged fixed position or direct pressure such as lying on the right side.  Also having pain in the neck had an MRI for this that demonstrated a bulging disc.  She is scheduled for steroid injection next Friday for this.   Previous HPI 10/11/22 Kari Williams  is a 63 y.o. female here for follow up follow-up for uveitis and scleritis on Humira 40 mg subcu q. 14 days methotrexate 12.5 mg p.o. weekly and folic acid 2 mg daily.  Overall she is doing pretty well since last visit still gets some knee pain without a lot of swelling.  Eye dryness and mild irritation no serious pain inflammation or visual acuity changes.  Most recent ophthalmology exam looked good.  She had recent labs obtained earlier this month with a normal complete blood count metabolic panel showed mild AST elevation to 57.     Previous HPI 06/19/22 Kari Williams is a 63 y.o. female here for follow up for uveitis and scleritis  on Humira 40 mg [place] q14days and MTX 12.5 mg PO weekly and folic acid 2 mg daily. After last visit right knee pain improved and doing well now. She had follow up with Dr. Sherryll Burger eye exam negative for active uveitis although had some dryness and surface irritation. Getting restasis approved for this. She also has migraines which were a problem I the past but had been free of these for years until recently.    Previous HPI 03/15/22 Kari Williams is a 63 y.o. 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 She had a UTI in June treated with nitrofurantoin then switched to cefuroxime based on susceptibilities and symptoms resolved.  Overall she is doing well no symptomatic complaints with eye irritation or vision changes.  She has some right knee pain and stiffness does not severely limit her mobility.  Symptoms are most noticeable with certain movements such as descending stairs.     Previous HPI 12/13/2021 Kari Williams is a 63 y.o. female here for follow up for uveiitis and scleritis on methotrexate 12.5 mg PO weekly and Humira [place] q14days. Since our last visit her finger pain is improved not needing any additional NSAIDs for symptom management. She has floaters but no new visual complaint no eye redness or pain.   Previous HPI 09/13/2021 Kari Williams is a 63 y.o. female here for follow up for uveiitis and scleritis on methotrexate 15 mg PO weekly and Humira [place] q14days. Last visit with Dr. Sherryll Burger 08/01/21. Eyes are doing okay without new complaints and exam was okay. She has noticed some increased pain and swelling affecting left hand fingers also mildly tender nodule swelling behind her left ear.   Previous HPI 06/14/21 Kari Williams is a 63 y.o. female here for follow up for uveitis and scleritis on methotrexate 15 mg PO weekly and Humira 40 mg [place] q14days. She has experienced small painless skin rashes in a few places including the left elbow these improved with conservative  treatment moisturizing. She has left calf cramps at night ongoing no problems during the day or with mobility. Bilateral eye dryness is slightly worse than before during day time, using lubricating eye drops more than before. She has future appointment with Dr. Sherryll Burger scheduled for next week.   Previous HPI 06/07/20 Kari Williams is a 63 y.o. 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 She has a previous history of treatment with methotrexate for iritis in 2010-2015 with Dr. Elmer Picker and Dr. Kellie Simmering. She had been off any treatment but visit with Dr. 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  Since earlier this year she has experienced eye redness watering and blurry vision.  She is more symptomatic complaint on the right than  on the left currently but both sides bother her.  Besides the eye complaints she does also report some fairly diffuse arthralgias and morning stiffness up to 30 minutes.  She has not noticed any specific joint swelling warmth or erythema.  In the past she has also been treated with oral or ocular steroids but has not received any for the current relapse of symptoms.   Review of Systems  Constitutional:  Positive for fatigue.  HENT:  Negative for mouth sores and mouth dryness.   Eyes:  Positive for dryness.  Respiratory:  Negative for shortness of breath.   Cardiovascular:  Negative for chest pain and palpitations.  Gastrointestinal:  Negative for blood in stool, constipation and diarrhea.  Endocrine: Negative for increased urination.  Genitourinary:  Negative for involuntary urination.  Musculoskeletal:  Positive for joint pain and joint pain. Negative for gait problem, joint swelling, myalgias, muscle weakness, morning stiffness, muscle tenderness and myalgias.  Skin:  Negative for color change, rash, hair loss and sensitivity to sunlight.  Allergic/Immunologic: Negative for susceptible to  infections.  Neurological:  Negative for dizziness and headaches.  Hematological:  Negative for swollen glands.  Psychiatric/Behavioral:  Positive for depressed mood and sleep disturbance. The patient is nervous/anxious.     PMFS History:  Patient Active Problem List   Diagnosis Date Noted   Nausea 06/20/2023   At increased risk for cardiovascular disease 05/23/2023   Hypercholesteremia 04/30/2023   Tendinopathy of right gluteus medius 01/24/2023   Class 1 obesity with serious comorbidity and body mass index (BMI) of 30.0 to 30.9 in adult 10/18/2022   Vitamin D deficiency 07/26/2022   Prediabetes 07/26/2022   Generalized obesity 07/26/2022   Migraines 06/19/2022   Pain in right knee 03/15/2022   Pain in left finger(s) 09/13/2021   Right ear pain 09/13/2021   Hemorrhage of rectum and anus 03/08/2021   Pain in right shoulder 03/08/2021   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   High risk medication use 06/07/2020   Carpal tunnel syndrome 11/29/2015   Hypertensive retinopathy of both eyes 11/01/2015   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   Nuclear sclerotic cataract of both eyes 10/13/2015   Epiretinal membrane (ERM) of right eye 10/13/2015   Lumbosacral radiculitis 09/01/2014   Encounter for routine gynecological examination 11/19/2011   Generalized abdominal or pelvic swelling or mass or lump 11/19/2011   Overweight (BMI 25.0-29.9) 11/19/2011   Fibroids 11/19/2011   HTN (hypertension) 11/19/2011   Condyloma 11/19/2011    Past Medical History:  Diagnosis Date   Allergy    seasonal   Anxiety    Arthritis    osteoarthritis/ddd   Bulging of cervical intervertebral disc    Chest pain    Depression    Female infertility    Hypertension    Rheumatoid arthritis (HCC)     Family History  Problem Relation Age of Onset   Dementia Mother    Hypotension Mother    Stroke Father    Hypertension Father    Sudden death Father    Breast cancer Sister    Hypertension Sister     Thyroid disease Sister    Colon cancer Brother    Sleep apnea Brother    Stroke Brother    Lupus Paternal Aunt    Lupus Niece    Past Surgical History:  Procedure Laterality Date   ABDOMINAL HYSTERECTOMY     CARPAL TUNNEL RELEASE  07/30/2000   right   COLONOSCOPY     LUMBAR DISC SURGERY  07/30/2004   LUMBAR FUSION  07/30/2009   THORACIC DISC SURGERY  07/30/2000   TUBAL LIGATION  07/02/2012   Procedure: BILATERAL TUBAL LIGATION;  Surgeon: Kirkland Hun, MD;  Location: [facility];  Service: Gynecology;  Laterality: Bilateral;   UPPER GASTROINTESTINAL ENDOSCOPY     VAGINAL HYSTERECTOMY  07/02/2012   Procedure: HYSTERECTOMY VAGINAL;  Surgeon: Kirkland Hun, MD;  Location: [facility];  Service: Gynecology;  Laterality: N/A;   Social History   Social History Narrative   Not on file   Immunization History  Administered Date(s) Administered   Influenza Split 07/03/2012   Influenza, Seasonal, Injecte, Preservative Fre 05/28/2023   PFIZER(Purple Top)SARS-COV-2 Vaccination 10/13/2019, 11/03/2019     Objective: Vital Signs: BP 120/77 (BP Location: Left Arm, Patient Position: Sitting, Cuff Size: Normal)   Pulse 88   Resp 14   Ht 5\' 3"  (1.6 m)   Wt 172 lb (78 kg)   BMI 30.47 kg/m    Physical Exam HENT:     Mouth/Throat:     Mouth: Mucous membranes are moist.     Pharynx: Oropharynx is clear.  Eyes:     Conjunctiva/sclera: Conjunctivae normal.  Cardiovascular:     Rate and Rhythm: Normal rate and regular rhythm.  Pulmonary:     Effort: Pulmonary effort is normal.     Breath sounds: Normal breath sounds.  Musculoskeletal:     Right lower leg: No edema.     Left lower leg: No edema.  Lymphadenopathy:     Cervical: No cervical adenopathy.  Skin:    General: Skin is warm and dry.     Findings: No rash.  Neurological:     Mental Status: She is alert.  Psychiatric:        Mood and Affect: Mood normal.      Musculoskeletal Exam:  Right shoulder some tenderness to  pressure on lateral side and along top of scapula, no radiation Elbows full ROM no tenderness or swelling Wrists full ROM no tenderness or swelling Fingers full ROM no tenderness or swelling Knees full ROM no tenderness or swelling   Investigation: No additional findings.  Imaging: No results found.  Recent Labs: Lab Results  Component Value Date   WBC 7.4 05/02/2023   HGB 12.9 05/02/2023   PLT 350 05/02/2023   NA 142 05/02/2023   K 4.6 05/02/2023   CL 105 05/02/2023   CO2 29 05/02/2023   GLUCOSE 87 05/02/2023   BUN 10 05/02/2023   CREATININE 0.87 05/02/2023   BILITOT 0.4 05/02/2023   ALKPHOS 88 06/27/2022   AST 21 05/02/2023   ALT 18 05/02/2023   PROT 7.1 05/02/2023   ALBUMIN 4.5 06/27/2022   CALCIUM 9.8 05/02/2023   GFRAA 77 12/07/2020   QFTBGOLDPLUS NEGATIVE 05/02/2023    Speciality Comments: No specialty comments available.  Procedures:  No procedures performed Allergies: Penicillins   Assessment / Plan:     Visit Diagnoses: Bilateral scleritis Bilateral chronic anterior uveitis - Plan: methotrexate (RHEUMATREX) 2.5 MG tablet, Sedimentation rate Methotrexate dose reduced to 4 tablets.  Biologic was switched to biosimilar similarity.  No mention of adverse effects or disease flare.  No concerning exam findings has upcoming appointment soon for repeat eye exam. -Checking sed rate for disease activity monitoring -Continue Simlandi 40 mg subcu q. 14 days -Continue methotrexate 10 mg p.o. weekly folic acid 2 mg daily   High risk medication use - Humira 40 mg subcu q. 14 days methotrexate 10 mg p.o. weekly folic acid 2  mg daily. Methotrexate decreased to 10 mg in last lab note. - Plan: CBC with Differential/Platelet, COMPLETE METABOLIC PANEL WITH GFR Tolerating medication well.  No serious interval infections. -Checking CBC and CMP for medication monitoring on continued long-term use of methotrexate adalimumab  Tendinopathy of right gluteus medius Hip pain  symptoms are resolved.  Weight Management Patient reports successful weight loss with Zepbound and Metformin. No hypoglycemic episodes reported. Patient has also incorporated regular exercise and dietary changes. -Continue current regimen.  Dry Eyes Patient reports dry eyes, using heated patches for relief. No mention of vision changes or pain.  Discussed possible use of humidifier but she actually keeps windows open at night for the cold and air movement may be contributing to dryness. -Continue current regimen and follow up with Dr. Sherryll Burger on 08/27/2023.  Rotator Cuff Injury Patient reports pain and limited range of motion in shoulder, likely due to previous partial tear which is known problem. No mention of acute injury or worsening symptoms. -Continue current regimen of exercises, avoiding movements that cause sharp pain.    Orders: Orders Placed This Encounter  Procedures   Sedimentation rate   CBC with Differential/Platelet   COMPLETE METABOLIC PANEL WITH GFR   Meds ordered this encounter  Medications   methotrexate (RHEUMATREX) 2.5 MG tablet    Sig: Take 4 tablets (10 mg total) by mouth once a week.    Dispense:  48 tablet    Refill:  0     Follow-Up Instructions: Return in about 3 months (around 11/20/2023) for RA/cleritis on ADA/MTX f/u 3mos.   Fuller Plan, MD  Note - This record has been created using [employer].  Chart creation errors have been sought, but may not always  have been located. Such creation errors do not reflect on  the standard of medical care.

## 2023-08-22 ENCOUNTER — Ambulatory Visit: Payer: BC Managed Care – PPO | Attending: Internal Medicine | Admitting: Internal Medicine

## 2023-08-22 ENCOUNTER — Encounter: Payer: Self-pay | Admitting: Internal Medicine

## 2023-08-22 VITALS — BP 120/77 | HR 88 | Resp 14 | Ht 63.0 in | Wt 172.0 lb

## 2023-08-22 DIAGNOSIS — H15003 Unspecified scleritis, bilateral: Secondary | ICD-10-CM

## 2023-08-22 DIAGNOSIS — R7303 Prediabetes: Secondary | ICD-10-CM

## 2023-08-22 DIAGNOSIS — M67951 Unspecified disorder of synovium and tendon, right thigh: Secondary | ICD-10-CM

## 2023-08-22 DIAGNOSIS — H2013 Chronic iridocyclitis, bilateral: Secondary | ICD-10-CM

## 2023-08-22 DIAGNOSIS — Z79899 Other long term (current) drug therapy: Secondary | ICD-10-CM

## 2023-08-22 MED ORDER — METHOTREXATE SODIUM 2.5 MG PO TABS: 10.0000 mg | ORAL_TABLET | ORAL | 0 refills | Status: DC

## 2023-08-23 LAB — CBC WITH DIFFERENTIAL/PLATELET
Absolute Lymphocytes: 2855 {cells}/uL (ref 850–3900)
Absolute Monocytes: 764 {cells}/uL (ref 200–950)
Basophils Absolute: 47 {cells}/uL (ref 0–200)
Basophils Relative: 0.6 %
Eosinophils Absolute: 78 {cells}/uL (ref 15–500)
Eosinophils Relative: 1 %
HCT: 40.8 % (ref 35.0–45.0)
Hemoglobin: 13.5 g/dL (ref 11.7–15.5)
MCH: 33 pg (ref 27.0–33.0)
MCHC: 33.1 g/dL (ref 32.0–36.0)
MCV: 99.8 fL (ref 80.0–100.0)
MPV: 9.4 fL (ref 7.5–12.5)
Monocytes Relative: 9.8 %
Neutro Abs: 4056 {cells}/uL (ref 1500–7800)
Neutrophils Relative %: 52 %
Platelets: 308 10*3/uL (ref 140–400)
RBC: 4.09 10*6/uL (ref 3.80–5.10)
RDW: 13 % (ref 11.0–15.0)
Total Lymphocyte: 36.6 %
WBC: 7.8 10*3/uL (ref 3.8–10.8)

## 2023-08-23 LAB — COMPLETE METABOLIC PANEL WITH GFR
AG Ratio: 1.5 (calc) (ref 1.0–2.5)
ALT: 21 U/L (ref 6–29)
AST: 26 U/L (ref 10–35)
Albumin: 4.8 g/dL (ref 3.6–5.1)
Alkaline phosphatase (APISO): 72 U/L (ref 37–153)
BUN: 13 mg/dL (ref 7–25)
CO2: 27 mmol/L (ref 20–32)
Calcium: 10.3 mg/dL (ref 8.6–10.4)
Chloride: 105 mmol/L (ref 98–110)
Creat: 0.85 mg/dL (ref 0.50–1.05)
Globulin: 3.2 g/dL (ref 1.9–3.7)
Glucose, Bld: 75 mg/dL (ref 65–99)
Potassium: 4.8 mmol/L (ref 3.5–5.3)
Sodium: 141 mmol/L (ref 135–146)
Total Bilirubin: 0.7 mg/dL (ref 0.2–1.2)
Total Protein: 8 g/dL (ref 6.1–8.1)
eGFR: 76 mL/min/{1.73_m2} (ref >=60)

## 2023-08-23 LAB — SEDIMENTATION RATE: Sed Rate: 9 mm/h (ref 0–30)

## 2023-08-23 NOTE — Progress Notes (Signed)
Sedimentation rate of 9 remains normal.  Blood count and kidney and liver function are all normal.  No problem for continuing the current Humira and methotrexate.

## 2023-08-27 DIAGNOSIS — H35371 Puckering of macula, right eye: Secondary | ICD-10-CM | POA: Diagnosis not present

## 2023-08-27 DIAGNOSIS — H15002 Unspecified scleritis, left eye: Secondary | ICD-10-CM | POA: Diagnosis not present

## 2023-08-27 DIAGNOSIS — H2513 Age-related nuclear cataract, bilateral: Secondary | ICD-10-CM | POA: Diagnosis not present

## 2023-08-27 DIAGNOSIS — H35033 Hypertensive retinopathy, bilateral: Secondary | ICD-10-CM | POA: Diagnosis not present

## 2023-09-02 ENCOUNTER — Ambulatory Visit (INDEPENDENT_AMBULATORY_CARE_PROVIDER_SITE_OTHER): Payer: BC Managed Care – PPO | Admitting: Internal Medicine

## 2023-09-02 ENCOUNTER — Encounter (INDEPENDENT_AMBULATORY_CARE_PROVIDER_SITE_OTHER): Payer: Self-pay | Admitting: Internal Medicine

## 2023-09-02 ENCOUNTER — Other Ambulatory Visit (HOSPITAL_BASED_OUTPATIENT_CLINIC_OR_DEPARTMENT_OTHER): Payer: Self-pay

## 2023-09-02 VITALS — BP 128/85 | HR 73 | Temp 97.8°F | Ht 63.0 in | Wt 165.0 lb

## 2023-09-02 DIAGNOSIS — E66811 Obesity, class 1: Secondary | ICD-10-CM

## 2023-09-02 DIAGNOSIS — R7303 Prediabetes: Secondary | ICD-10-CM | POA: Diagnosis not present

## 2023-09-02 DIAGNOSIS — Z6830 Body mass index (BMI) 30.0-30.9, adult: Secondary | ICD-10-CM

## 2023-09-02 DIAGNOSIS — E7800 Pure hypercholesterolemia, unspecified: Secondary | ICD-10-CM

## 2023-09-02 DIAGNOSIS — I10 Essential (primary) hypertension: Secondary | ICD-10-CM | POA: Diagnosis not present

## 2023-09-02 MED ORDER — TIRZEPATIDE-WEIGHT MANAGEMENT 5 MG/0.5ML ~~LOC~~ SOAJ
5.0000 mg | SUBCUTANEOUS | 0 refills | Status: DC
  Filled 2023-09-02: qty 2, 28d supply, fill #0

## 2023-09-02 NOTE — Assessment & Plan Note (Signed)
Her most recent A1c was 6.0 and improved from 6.4, she is having some side effects from metformin we will therefore discontinue medication.  She is also now on Zepbound for pharmacoprophylaxis and weight management.  She will continue GLP-1 therapy.  Patient has also been counseled on maintaining a diet with a low glycemic load.

## 2023-09-02 NOTE — Assessment & Plan Note (Signed)
Blood pressure well-controlled.  She is currently on amlodipine 10 mg a day and losartan 100 mg.  Continue current regimen.  Monitor for orthostasis while losing weight

## 2023-09-02 NOTE — Assessment & Plan Note (Signed)
She has an upcoming physical with her PCP of asked for them to recheck her cholesterol fasting.  We will have further discussions regarding cardiovascular risk after that.

## 2023-09-02 NOTE — Assessment & Plan Note (Signed)
Kari Williams has lost close to 20 pounds on medically supervised weight management plan inclusive of GLP-1.  GLP-1 therapy was started in the fall of last year.  Her body fat percentage is down to 32% her visceral fat rating is down to 9 so overall improvement in body composition.  Her goal weight is 160 pounds we discussed elements necessary for successful weight loss maintenance and she is almost there.  She will look into amount of protein goal is to get 30 to 40 g per meal.  She has a decent amount of volume of physical activity and will continue.

## 2023-09-02 NOTE — Progress Notes (Signed)
Office: [phone]  /  Fax: [phone]  Weight Summary And Biometrics  Vitals Temp: 97.8 F (36.6 C) BP: 128/85 Pulse Rate: 73 SpO2: 100 %   Anthropometric Measurements Height: 5\' 3"  (1.6 m) Weight: 165 lb (74.8 kg) BMI (Calculated): 29.24 Weight at Last Visit: 172 lb Weight Lost Since Last Visit: 7 lb Weight Gained Since Last Visit: 0 lb Starting Weight: 180 lb Total Weight Loss (lbs): 15 lb (6.804 kg) Peak Weight: 230 lb   Body Composition  Body Fat %: 32.4 % Fat Mass (lbs): 53.8 lbs Muscle Mass (lbs): 106.4 lbs Total Body Water (lbs): 71 lbs Visceral Fat Rating : 9    No data recorded Today's Visit #: 15  Starting Date: 06/27/22   Subjective   Chief Complaint: Obesity  Kari Williams is here to discuss her progress with her obesity treatment plan. She is on the the Category 2 Plan and states she is following her eating plan approximately 90 % of the time. She states she is exercising 60-120 minutes 7 times per week.  Weight Progress Since Last Visit:  Since last office visit she has lost 9 pounds. She reports good adherence to reduced calorie nutritional plan. She has been working on reading food labels, not skipping meals, increasing protein intake at every meal, drinking more water, making healthier choices, reducing portion sizes, and incorporating more whole foods   Challenges affecting patient progress: none.   Orexigenic Control: Denies problems with appetite and hunger signals.  Denies problems with satiety and satiation.  Denies problems with eating patterns and portion control.  Denies abnormal cravings. Denies feeling deprived or restricted.   Pharmacotherapy for weight management: She is currently taking Zepbound with adequate clinical response  and without side effects..   Assessment and Plan   Treatment Plan For Obesity:  Recommended Dietary Goals  Kari Williams is currently in the action stage of change. As such, her goal is to continue  weight management plan. She has agreed to: continue current plan  Behavioral Health and Counseling  We discussed the following behavioral modification strategies today: continue to work on maintaining a reduced calorie state, getting the recommended amount of protein, incorporating whole foods, making healthy choices, staying well hydrated and practicing mindfulness when eating..  Additional education and resources provided today: None  Recommended Physical Activity Goals  Kari Williams has been advised to work up to 150 minutes of moderate intensity aerobic activity a week and strengthening exercises 2-3 times per week for cardiovascular health, weight loss maintenance and preservation of muscle mass.   She has agreed to :  Think about enjoyable ways to increase daily physical activity and overcoming barriers to exercise and Increase physical activity in their day and reduce sedentary time (increase NEAT).  Pharmacotherapy  We discussed various medication options to help Kari Williams with her weight loss efforts and we both agreed to : adequate clinical response to current dose, continue current regimen  Associated Conditions Impacted by Obesity Treatment  Primary hypertension Assessment & Plan:  Blood pressure well-controlled.  She is currently on amlodipine 10 mg a day and losartan 100 mg.  Continue current regimen.  Monitor for orthostasis while losing weight   Class 1 obesity with serious comorbidity and body mass index (BMI) of 30.0 to 30.9 in adult, unspecified obesity type Assessment & Plan: Kari Williams has lost close to 20 pounds on medically supervised weight management plan inclusive of GLP-1.  GLP-1 therapy was started in the fall of last year.  Her body fat percentage is  down to 32% her visceral fat rating is down to 9 so overall improvement in body composition.  Her goal weight is 160 pounds we discussed elements necessary for successful weight loss maintenance and she is almost there.   She will look into amount of protein goal is to get 30 to 40 g per meal.  She has a decent amount of volume of physical activity and will continue.  Orders: -     Tirzepatide-Weight Management; Inject 5 mg into the skin once a week.  Dispense: 2 mL; Refill: 0  Prediabetes Assessment & Plan: Her most recent A1c was 6.0 and improved from 6.4, she is having some side effects from metformin we will therefore discontinue medication.  She is also now on Zepbound for pharmacoprophylaxis and weight management.  She will continue GLP-1 therapy.  Patient has also been counseled on maintaining a diet with a low glycemic load.  Orders: -     Tirzepatide-Weight Management; Inject 5 mg into the skin once a week.  Dispense: 2 mL; Refill: 0  Hypercholesteremia Assessment & Plan: She has an upcoming physical with her PCP of asked for them to recheck her cholesterol fasting.  We will have further discussions regarding cardiovascular risk after that.      Objective   Physical Exam:  Blood pressure 128/85, pulse 73, temperature 97.8 F (36.6 C), height 5\' 3"  (1.6 m), weight 165 lb (74.8 kg), SpO2 100%. Body mass index is 29.23 kg/m.  General: She is overweight, cooperative, alert, well developed, and in no acute distress. PSYCH: Has normal mood, affect and thought process.   HEENT: EOMI, sclerae are anicteric. Lungs: Normal breathing effort, no conversational dyspnea. Extremities: No edema.  Neurologic: No gross sensory or motor deficits. No tremors or fasciculations noted.    Diagnostic Data Reviewed:  BMET    Component Value Date/Time   NA 141 08/22/2023 1146   NA 142 06/27/2022 1007   K 4.8 08/22/2023 1146   CL 105 08/22/2023 1146   CO2 27 08/22/2023 1146   GLUCOSE 75 08/22/2023 1146   BUN 13 08/22/2023 1146   BUN 13 06/27/2022 1007   CREATININE 0.85 08/22/2023 1146   CALCIUM 10.3 08/22/2023 1146   GFRNONAA 66 12/07/2020 1518   GFRAA 77 12/07/2020 1518   Lab Results  Component  Value Date   HGBA1C 6.0 (H) 05/02/2023   HGBA1C 6.4 (H) 06/27/2022   Lab Results  Component Value Date   INSULIN 23.1 06/27/2022   Lab Results  Component Value Date   TSH 1.070 06/27/2022   CBC    Component Value Date/Time   WBC 7.8 08/22/2023 1146   RBC 4.09 08/22/2023 1146   HGB 13.5 08/22/2023 1146   HCT 40.8 08/22/2023 1146   PLT 308 08/22/2023 1146   MCV 99.8 08/22/2023 1146   MCH 33.0 08/22/2023 1146   MCHC 33.1 08/22/2023 1146   RDW 13.0 08/22/2023 1146   Iron Studies No results found for: "IRON", "TIBC", "FERRITIN", "IRONPCTSAT" Lipid Panel     Component Value Date/Time   CHOL 234 (H) 06/27/2022 1007   TRIG 144 06/27/2022 1007   HDL 43 06/27/2022 1007   LDLCALC 165 (H) 06/27/2022 1007   Hepatic Function Panel     Component Value Date/Time   PROT 8.0 08/22/2023 1146   PROT 7.8 06/27/2022 1007   ALBUMIN 4.5 06/27/2022 1007   AST 26 08/22/2023 1146   ALT 21 08/22/2023 1146   ALKPHOS 88 06/27/2022 1007   BILITOT 0.7 08/22/2023  1146   BILITOT 0.5 06/27/2022 1007      Component Value Date/Time   TSH 1.070 06/27/2022 1007   Nutritional Lab Results  Component Value Date   VD25OH 51.7 10/18/2022   VD25OH 11.4 (L) 06/27/2022    Follow-Up   Return in about 4 weeks (around 09/30/2023) for For Weight Mangement with Dr. Rikki Spearing.Marland Kitchen She was informed of the importance of frequent follow up visits to maximize her success with intensive lifestyle modifications for her multiple health conditions.  Attestation Statement   Reviewed by clinician on day of visit: allergies, medications, problem list, medical history, surgical history, family history, social history, and previous encounter notes.     Worthy Rancher, MD

## 2023-09-23 ENCOUNTER — Other Ambulatory Visit: Payer: Self-pay | Admitting: Internal Medicine

## 2023-09-23 DIAGNOSIS — H15003 Unspecified scleritis, bilateral: Secondary | ICD-10-CM

## 2023-09-30 ENCOUNTER — Ambulatory Visit (INDEPENDENT_AMBULATORY_CARE_PROVIDER_SITE_OTHER): Payer: BC Managed Care – PPO | Admitting: Internal Medicine

## 2023-09-30 ENCOUNTER — Encounter (INDEPENDENT_AMBULATORY_CARE_PROVIDER_SITE_OTHER): Payer: Self-pay | Admitting: Internal Medicine

## 2023-09-30 ENCOUNTER — Other Ambulatory Visit: Payer: Self-pay

## 2023-09-30 ENCOUNTER — Other Ambulatory Visit (HOSPITAL_BASED_OUTPATIENT_CLINIC_OR_DEPARTMENT_OTHER): Payer: Self-pay

## 2023-09-30 VITALS — BP 130/79 | HR 80 | Temp 98.0°F | Ht 63.0 in | Wt 162.0 lb

## 2023-09-30 DIAGNOSIS — Z6830 Body mass index (BMI) 30.0-30.9, adult: Secondary | ICD-10-CM | POA: Diagnosis not present

## 2023-09-30 DIAGNOSIS — I10 Essential (primary) hypertension: Secondary | ICD-10-CM

## 2023-09-30 DIAGNOSIS — R7303 Prediabetes: Secondary | ICD-10-CM | POA: Diagnosis not present

## 2023-09-30 DIAGNOSIS — E66811 Obesity, class 1: Secondary | ICD-10-CM

## 2023-09-30 MED ORDER — TIRZEPATIDE-WEIGHT MANAGEMENT 5 MG/0.5ML ~~LOC~~ SOAJ
5.0000 mg | SUBCUTANEOUS | 0 refills | Status: DC
Start: 2023-09-30 — End: 2023-10-01
  Filled 2023-09-30: qty 2, 28d supply, fill #0

## 2023-09-30 NOTE — Progress Notes (Unsigned)
 Office: [phone]  /  Fax: [phone]  Weight Summary And Biometrics  Vitals Temp: 98 F (36.7 C) BP: 130/79 Pulse Rate: 80 SpO2: 100 %   Anthropometric Measurements Height: 5\' 3"  (1.6 m) Weight: 162 lb (73.5 kg) BMI (Calculated): 28.7 Weight at Last Visit: 165 lb Weight Lost Since Last Visit: 3 lb Weight Gained Since Last Visit: 0 lb Starting Weight: 180 lb Total Weight Loss (lbs): 18 lb (8.165 kg) Peak Weight: 230 lb   Body Composition  Body Fat %: 31.8 % Fat Mass (lbs): 51.6 lbs Muscle Mass (lbs): 105.2 lbs Total Body Water (lbs): 70.8 lbs Visceral Fat Rating : 9    No data recorded Today's Visit #: 16  Starting Date: 06/27/22   Subjective   Chief Complaint: Obesity  Interval History Discussed the use of AI scribe software for clinical note transcription with the patient, who gave verbal consent to proceed.  History of Present Illness   Kari Williams is a 63 year old female who presents for medical weight management.  She is actively engaged in a medical weight management program, adhering to a category two plan 90% of the time, and has lost three pounds since her last visit. She tracks her caloric intake, consumes more whole foods, ensures adequate protein intake, maintains hydration, and avoids skipping meals. Her exercise routine includes 60 minutes of activity seven days a week, combining strength training, cardio, and yoga, with an average of 10,000 steps daily. She is currently taking Zepbound 5 mg once a week for obesity management and aims to maintain a protein intake of 90 to 120 grams per day. Her body fat percentage goal is less than 36%, currently at 31.8%.  She experiences some stress and is not getting more than seven hours of sleep, which she acknowledges as a concern for sustaining her current regimen. Despite this, she feels good overall.  She reports shoulder and neck discomfort, particularly affecting her sleep. Aleve has not  provided relief, and she finds tramadol effective, though it is not suitable for long-term use. She has a history of rotator cuff issues, which contribute to nighttime discomfort.  She has a history of an A1c of 6.4, indicating pre-diabetes, and previously did not tolerate metformin well. She is currently managing her weight and blood sugar levels with Zepbound instead of metformin and is concerned about maintaining her weight loss, especially with upcoming events and celebrations.         Challenges affecting patient progress: none.    Pharmacotherapy for weight management: She is currently taking Zepbound with adequate clinical response  and without side effects..   Assessment and Plan   Treatment Plan For Obesity:  Recommended Dietary Goals  Kari Williams is currently in the action stage of change. As such, her goal is to continue weight management plan. She has agreed to: continue current plan  Behavioral Health and Counseling  We discussed the following behavioral modification strategies today: continue to work on maintaining a reduced calorie state, getting the recommended amount of protein, incorporating whole foods, making healthy choices, staying well hydrated and practicing mindfulness when eating..  Additional education and resources provided today: None  Recommended Physical Activity Goals  Kari Williams has been advised to work up to 150 minutes of moderate intensity aerobic activity a week and strengthening exercises 2-3 times per week for cardiovascular health, weight loss maintenance and preservation of muscle mass.   She has agreed to :  continue to gradually increase the amount and intensity of  exercise routine  Pharmacotherapy  We discussed various medication options to help Kari Williams with her weight loss efforts and we both agreed to : adequate clinical response to current dose, continue current regimen  Associated Conditions Impacted by Obesity Treatment  Primary  hypertension Assessment & Plan:  Blood pressure well-controlled.  She is currently on amlodipine 10 mg a day and losartan 100 mg.  Continue current regimen.  Monitor for orthostasis while losing weight   Class 1 obesity with serious comorbidity and body mass index (BMI) of 30.0 to 30.9 in adult, unspecified obesity type Assessment & Plan: She is engaged in a medical weight management program, having lost three pounds since the last visit. She adheres to a category two plan 90% of the time, tracking calories, consuming whole foods, maintaining protein intake, and staying hydrated. She exercises daily, averaging 60 minutes per day, and takes 10,000 steps daily. She is on Zepbound 5 mg weekly for anti-obesity. Her body fat percentage is down 31.8%, below the target of 36%. She is considering maintaining her current weight or losing more, with a discussion on maintaining weight for 6-12 months to reset the fat mass set point. Concerns include sustaining progress, managing stress, and sleep. Options include continuing Zepbound for maintenance or tapering after maintaining desired weight for 6-12 months. The importance of protein intake for appetite suppression and metabolism was emphasized. - Continue Zepbound 5 mg once a week - Maintain current exercise regimen - Increase protein intake to 120 grams per day - Monitor body composition and weight - Consider tapering Zepbound after reaching desired weight and maintaining it for 6-12 months   Orders: -     Tirzepatide-Weight Management; Inject 5 mg into the skin once a week.  Dispense: 2 mL; Refill: 0  Prediabetes Assessment & Plan: Her most recent A1c was 6.0 and improved from 6.4, she had some side effects from metformin so medication was discontinued.  She is also now on Zepbound for pharmacoprophylaxis and weight management with adequate clinical response and no side effects.  She will continue GLP-1 therapy.  Patient has also been counseled on  maintaining a diet with a low glycemic load.  Orders: -     Tirzepatide-Weight Management; Inject 5 mg into the skin once a week.  Dispense: 2 mL; Refill: 0            Objective   Physical Exam:  Blood pressure 130/79, pulse 80, temperature 98 F (36.7 C), height 5\' 3"  (1.6 m), weight 162 lb (73.5 kg), SpO2 100%. Body mass index is 28.7 kg/m.  General: She is overweight, cooperative, alert, well developed, and in no acute distress. PSYCH: Has normal mood, affect and thought process.   HEENT: EOMI, sclerae are anicteric. Lungs: Normal breathing effort, no conversational dyspnea. Extremities: No edema.  Neurologic: No gross sensory or motor deficits. No tremors or fasciculations noted.    Diagnostic Data Reviewed:  BMET    Component Value Date/Time   NA 141 08/22/2023 1146   NA 142 06/27/2022 1007   K 4.8 08/22/2023 1146   CL 105 08/22/2023 1146   CO2 27 08/22/2023 1146   GLUCOSE 75 08/22/2023 1146   BUN 13 08/22/2023 1146   BUN 13 06/27/2022 1007   CREATININE 0.85 08/22/2023 1146   CALCIUM 10.3 08/22/2023 1146   GFRNONAA 66 12/07/2020 1518   GFRAA 77 12/07/2020 1518   Lab Results  Component Value Date   HGBA1C 6.0 (H) 05/02/2023   HGBA1C 6.4 (H) 06/27/2022   Lab  Results  Component Value Date   INSULIN 23.1 06/27/2022   Lab Results  Component Value Date   TSH 1.070 06/27/2022   CBC    Component Value Date/Time   WBC 7.8 08/22/2023 1146   RBC 4.09 08/22/2023 1146   HGB 13.5 08/22/2023 1146   HCT 40.8 08/22/2023 1146   PLT 308 08/22/2023 1146   MCV 99.8 08/22/2023 1146   MCH 33.0 08/22/2023 1146   MCHC 33.1 08/22/2023 1146   RDW 13.0 08/22/2023 1146   Iron Studies No results found for: "IRON", "TIBC", "FERRITIN", "IRONPCTSAT" Lipid Panel     Component Value Date/Time   CHOL 234 (H) 06/27/2022 1007   TRIG 144 06/27/2022 1007   HDL 43 06/27/2022 1007   LDLCALC 165 (H) 06/27/2022 1007   Hepatic Function Panel     Component Value Date/Time    PROT 8.0 08/22/2023 1146   PROT 7.8 06/27/2022 1007   ALBUMIN 4.5 06/27/2022 1007   AST 26 08/22/2023 1146   ALT 21 08/22/2023 1146   ALKPHOS 88 06/27/2022 1007   BILITOT 0.7 08/22/2023 1146   BILITOT 0.5 06/27/2022 1007      Component Value Date/Time   TSH 1.070 06/27/2022 1007   Nutritional Lab Results  Component Value Date   VD25OH 51.7 10/18/2022   VD25OH 11.4 (L) 06/27/2022    Medications: Outpatient Encounter Medications as of 09/30/2023  Medication Sig   Adalimumab-ryvk, 2 Pen, 40 MG/0.4ML AJKT INJECT 40 MG UNDER THE SKIN EVERY 14 DAYS   amLODipine (NORVASC) 10 MG tablet Take 10 mg by mouth daily.   DULoxetine (CYMBALTA) 30 MG capsule Take 30 mg by mouth daily.   folic acid (FOLVITE) 1 MG tablet TAKE 2 TABLETS DAILY   losartan (COZAAR) 100 MG tablet Take 100 mg by mouth daily.   methotrexate (RHEUMATREX) 2.5 MG tablet Take 4 tablets (10 mg total) by mouth once a week.   ondansetron (ZOFRAN-ODT) 4 MG disintegrating tablet Take 1 tablet (4 mg total) by mouth every 8 (eight) hours as needed for nausea or vomiting.   VITAMIN D PO Take by mouth.   [DISCONTINUED] tirzepatide (ZEPBOUND) 5 MG/0.5ML Pen Inject 5 mg into the skin once a week.   tirzepatide (ZEPBOUND) 5 MG/0.5ML Pen Inject 5 mg into the skin once a week.   No facility-administered encounter medications on file as of 09/30/2023.     Follow-Up   Return in about 4 weeks (around 10/28/2023) for For Weight Mangement with Dr. Rikki Spearing.Marland Kitchen She was informed of the importance of frequent follow up visits to maximize her success with intensive lifestyle modifications for her multiple health conditions.  Attestation Statement   Reviewed by clinician on day of visit: allergies, medications, problem list, medical history, surgical history, family history, social history, and previous encounter notes.     Worthy Rancher, MD

## 2023-10-01 ENCOUNTER — Other Ambulatory Visit (HOSPITAL_BASED_OUTPATIENT_CLINIC_OR_DEPARTMENT_OTHER): Payer: Self-pay

## 2023-10-01 ENCOUNTER — Telehealth (INDEPENDENT_AMBULATORY_CARE_PROVIDER_SITE_OTHER): Payer: Self-pay | Admitting: Internal Medicine

## 2023-10-01 ENCOUNTER — Other Ambulatory Visit: Payer: Self-pay

## 2023-10-01 ENCOUNTER — Other Ambulatory Visit (INDEPENDENT_AMBULATORY_CARE_PROVIDER_SITE_OTHER): Payer: Self-pay

## 2023-10-01 DIAGNOSIS — E66811 Obesity, class 1: Secondary | ICD-10-CM

## 2023-10-01 DIAGNOSIS — R7303 Prediabetes: Secondary | ICD-10-CM

## 2023-10-01 MED ORDER — TIRZEPATIDE-WEIGHT MANAGEMENT 5 MG/0.5ML ~~LOC~~ SOAJ: 5.0000 mg | SUBCUTANEOUS | 0 refills | Status: DC

## 2023-10-01 NOTE — Assessment & Plan Note (Signed)
 Blood pressure well-controlled.  She is currently on amlodipine 10 mg a day and losartan 100 mg.  Continue current regimen.  Monitor for orthostasis while losing weight

## 2023-10-01 NOTE — Assessment & Plan Note (Signed)
 She is engaged in a medical weight management program, having lost three pounds since the last visit. She adheres to a category two plan 90% of the time, tracking calories, consuming whole foods, maintaining protein intake, and staying hydrated. She exercises daily, averaging 60 minutes per day, and takes 10,000 steps daily. She is on Zepbound 5 mg weekly for anti-obesity. Her body fat percentage is down 31.8%, below the target of 36%. She is considering maintaining her current weight or losing more, with a discussion on maintaining weight for 6-12 months to reset the fat mass set point. Concerns include sustaining progress, managing stress, and sleep. Options include continuing Zepbound for maintenance or tapering after maintaining desired weight for 6-12 months. The importance of protein intake for appetite suppression and metabolism was emphasized. - Continue Zepbound 5 mg once a week - Maintain current exercise regimen - Increase protein intake to 120 grams per day - Monitor body composition and weight - Consider tapering Zepbound after reaching desired weight and maintaining it for 6-12 months

## 2023-10-01 NOTE — Telephone Encounter (Signed)
 Hello!  Patient needs prescription sent to the [facility] on [address] and [address] because it has to get sent to their preferred pharmacy to get approved.   Thanks!

## 2023-10-01 NOTE — Telephone Encounter (Signed)
 Pt called stating her insurance will not fulfill Zepbound without her receiving a 90 day supply. Patient would like a 90 day supply of the medication. Please call the

## 2023-10-01 NOTE — Assessment & Plan Note (Signed)
 Her most recent A1c was 6.0 and improved from 6.4, she had some side effects from metformin so medication was discontinued.  She is also now on Zepbound for pharmacoprophylaxis and weight management with adequate clinical response and no side effects.  She will continue GLP-1 therapy.  Patient has also been counseled on maintaining a diet with a low glycemic load.

## 2023-10-03 ENCOUNTER — Telehealth (INDEPENDENT_AMBULATORY_CARE_PROVIDER_SITE_OTHER): Payer: Self-pay | Admitting: Internal Medicine

## 2023-10-03 ENCOUNTER — Other Ambulatory Visit (INDEPENDENT_AMBULATORY_CARE_PROVIDER_SITE_OTHER): Payer: Self-pay | Admitting: Internal Medicine

## 2023-10-03 DIAGNOSIS — R7303 Prediabetes: Secondary | ICD-10-CM

## 2023-10-03 DIAGNOSIS — Z6830 Body mass index (BMI) 30.0-30.9, adult: Secondary | ICD-10-CM

## 2023-10-03 MED ORDER — TIRZEPATIDE-WEIGHT MANAGEMENT 5 MG/0.5ML ~~LOC~~ SOAJ: 5.0000 mg | SUBCUTANEOUS | 0 refills | Status: DC

## 2023-10-03 NOTE — Telephone Encounter (Signed)
 Hello!  Patient needs a 90 day supply medication routed to [facility]. [facility] will not give patient 90 day supply and the 30 day is unaffordable. She said they can be called at [phone] or faxed at [phone].  She said she's completely out and she will more than likely miss a week.  Thanks!

## 2023-10-04 ENCOUNTER — Other Ambulatory Visit: Payer: Self-pay | Admitting: *Deleted

## 2023-10-04 DIAGNOSIS — H15003 Unspecified scleritis, bilateral: Secondary | ICD-10-CM

## 2023-10-04 MED ORDER — METHOTREXATE SODIUM 2.5 MG PO TABS: 10.0000 mg | ORAL_TABLET | ORAL | 0 refills | Status: DC

## 2023-10-04 NOTE — Telephone Encounter (Signed)
 Patient contacted the office and states her prescription was declined to to her mail order pharmacy. Patient advised upon reviewing the chart the reason it was declined as it is to early to refill. And it was sent to her local pharmacy in January. Patient states she did not pick that up from the local pharmacy and needs it resent to her local pharmacy.

## 2023-10-19 ENCOUNTER — Other Ambulatory Visit: Payer: Self-pay | Admitting: Internal Medicine

## 2023-10-21 NOTE — Telephone Encounter (Signed)
 Last Fill: 08/02/2023  Labs: 08/22/2023 Sedimentation rate of 9 remains normal.  Blood count and kidney and liver function are all normal.  No problem for continuing the current Humira and methotrexate.   TB Gold: 05/02/2023 Negative   Next Visit: 11/20/2023  Last Visit: 08/22/2023  DX: Bilateral scleritis   Current Dose per office note 08/22/2023: Simlandi 40 mg subcu q. 14 days   Okay to refill Adalimumab?

## 2023-10-23 DIAGNOSIS — R7303 Prediabetes: Secondary | ICD-10-CM | POA: Diagnosis not present

## 2023-10-23 DIAGNOSIS — I10 Essential (primary) hypertension: Secondary | ICD-10-CM | POA: Diagnosis not present

## 2023-10-23 DIAGNOSIS — Z0000 Encounter for general adult medical examination without abnormal findings: Secondary | ICD-10-CM | POA: Diagnosis not present

## 2023-10-23 DIAGNOSIS — F419 Anxiety disorder, unspecified: Secondary | ICD-10-CM | POA: Diagnosis not present

## 2023-10-23 DIAGNOSIS — E7800 Pure hypercholesterolemia, unspecified: Secondary | ICD-10-CM | POA: Diagnosis not present

## 2023-10-30 ENCOUNTER — Ambulatory Visit (INDEPENDENT_AMBULATORY_CARE_PROVIDER_SITE_OTHER): Admitting: Internal Medicine

## 2023-10-30 ENCOUNTER — Encounter (INDEPENDENT_AMBULATORY_CARE_PROVIDER_SITE_OTHER): Payer: Self-pay | Admitting: Internal Medicine

## 2023-10-30 VITALS — BP 136/80 | HR 82 | Temp 97.9°F | Ht 63.0 in | Wt 160.0 lb

## 2023-10-30 DIAGNOSIS — I10 Essential (primary) hypertension: Secondary | ICD-10-CM | POA: Diagnosis not present

## 2023-10-30 DIAGNOSIS — E7800 Pure hypercholesterolemia, unspecified: Secondary | ICD-10-CM | POA: Diagnosis not present

## 2023-10-30 DIAGNOSIS — Z6830 Body mass index (BMI) 30.0-30.9, adult: Secondary | ICD-10-CM

## 2023-10-30 DIAGNOSIS — R7303 Prediabetes: Secondary | ICD-10-CM

## 2023-10-30 DIAGNOSIS — E66811 Obesity, class 1: Secondary | ICD-10-CM | POA: Diagnosis not present

## 2023-10-30 MED ORDER — MULTI-VITAMIN/MINERALS PO TABS: 1.0000 | ORAL_TABLET | Freq: Every day | ORAL | Status: AC

## 2023-10-30 NOTE — Progress Notes (Signed)
 Office: [phone]  /  Fax: [phone]  Weight Summary And Biometrics  Vitals Temp: 97.9 F (36.6 C) BP: 136/80 Pulse Rate: 82 SpO2: 100 %   Anthropometric Measurements Height: 5\' 3"  (1.6 m) Weight: 160 lb (72.6 kg) BMI (Calculated): 28.35 Weight at Last Visit: 162 lb Weight Lost Since Last Visit: 2 lb Weight Gained Since Last Visit: 0 lb Starting Weight: 180 lb Total Weight Loss (lbs): 20 lb (9.072 kg) Peak Weight: 230 lb   Body Composition  Body Fat %: 33.4 % Fat Mass (lbs): 53.8 lbs Muscle Mass (lbs): 101.6 lbs Total Body Water (lbs): 70.8 lbs Visceral Fat Rating : 9    No data recorded Today's Visit #: 17  Starting Date: 06/27/22   Subjective   Chief Complaint: Obesity  Interval History Discussed the use of AI scribe software for clinical note transcription with the patient, who gave verbal consent to proceed.  History of Present Illness Kari Williams is a 63 year old female with hypertension, prediabetes, and hypercholesterolemia who presents for medical weight management.  She is actively engaged in a medical weight management program and has lost two pounds since her last visit. She follows a category two meal plan, tracks her calories 90% of the time, and focuses on eating whole foods with adequate protein intake. She maintains hydration and does not skip meals. She is satisfied with her current regimen and finds the mail order requirement for her medications convenient.  Her dietary habits include a focus on portion control and protein intake. She has reduced her portion sizes and is mindful of her protein consumption, using protein drinks for lunch and ensuring she eats breakfast and dinner. She has eliminated high-carbohydrate foods, such as burgers.  She exercises seven days a week for about 60 minutes, focusing on strengthening yoga. She sleeps about seven hours per night.  She is currently on Zepbound 5 mg once a week for  pharmacotherapy. Her blood pressure is managed with amlodipine 10 mg and losartan, with a recent reading of 136/80 mmHg. Her A1c is 5.5, indicating good control of her prediabetes. She has lost a significant amount of weight, from a peak of 230 pounds to her current weight of 160 pounds, which she attributes to her dietary changes and exercise routine.  Her hypertension is well-controlled with amlodipine 10 mg and losartan. She notes that her weight loss has likely contributed to improved blood pressure control.  Her prediabetes is well-controlled with an A1c of 5.5, which she attributes to weight loss and increased physical activity.  Her hypercholesterolemia is being managed as part of her weight management plan, with dietary changes likely contributing to improved cholesterol levels.  No issues with constipation or nausea. She is not currently taking a multivitamin but is considering adding one to her regimen. She continues to take vitamin D supplements due to a previous deficiency.    Challenges affecting patient progress: none.    Pharmacotherapy for weight management: She is currently taking Zepbound with adequate clinical response  and without side effects..   Assessment and Plan   Treatment Plan For Obesity:  Recommended Dietary Goals  Keitra is currently in the action stage of change. As such, her goal is to continue weight management plan. She has agreed to: continue current plan  Behavioral Health and Counseling  We discussed the following behavioral modification strategies today: continue to work on maintaining a reduced calorie state, getting the recommended amount of protein, incorporating whole foods, making healthy choices, staying  well hydrated and practicing mindfulness when eating..  Additional education and resources provided today: None  Recommended Physical Activity Goals  Moranda has been advised to work up to 150 minutes of moderate intensity aerobic activity  a week and strengthening exercises 2-3 times per week for cardiovascular health, weight loss maintenance and preservation of muscle mass.   She has agreed to :  Continue current level of physical activity   Pharmacotherapy  We discussed various medication options to help Dimitri with her weight loss efforts and we both agreed to : adequate clinical response to current dose, continue current regimen  Associated Conditions Impacted by Obesity Treatment  Primary hypertension  Class 1 obesity with serious comorbidity and body mass index (BMI) of 30.0 to 30.9 in adult, unspecified obesity type  Prediabetes  Hypercholesteremia    Assessment and Plan Assessment & Plan Obesity She is undergoing medical weight management with a 2-pound weight loss since the last visit. She follows a category two meal plan, tracks calories, consumes whole foods, and maintains adequate hydration. She exercises seven days a week for about sixty minutes, focusing on strengthening yoga. She is on Cepbound 5 mg once a week for pharmacotherapy. She is concerned about maintaining weight loss and avoiding weight regain. Emphasized the importance of portion control, protein intake, and self-monitoring to sustain weight loss. Discussed the potential for insurance coverage changes requiring a BMI =32 or documentation of two comorbidities for weight loss medication coverage. - Continue Zepbound 5 mg once a week - Advise self-monitoring of weight weekly - Advise tracking calories and protein intake - Recommend starting a multivitamin with minerals  Hypertension Hypertension is well-controlled with amlodipine 10 mg and losartan. Blood pressure is 136/80 mmHg. Improvement in blood pressure control noted with weight loss. - Continue amlodipine 10 mg - Continue losartan  Prediabetes A1c is 5.5, indicating well-controlled prediabetes. Improvement in A1c noted with weight loss and lifestyle  changes.  Hypercholesterolemia Hypercholesterolemia is managed as part of weight management and lifestyle changes. Improvement in cholesterol levels noted with weight loss.  General Health Maintenance Advised to take a multivitamin with minerals due to reduced food intake. Taking vitamin D supplements due to a history of low vitamin D levels. - Recommend starting a multivitamin with minerals - Continue vitamin D supplementation  Follow-up Advised to return for follow-up in two months to monitor progress and refill medication. Emphasized the importance of self-monitoring to maintain weight loss between visits. Discussed potential decrease in weight loss rate with longer intervals between check-ins and the importance of regular monitoring to prevent weight regain. - Schedule follow-up appointment in two months      Objective   Physical Exam:  Blood pressure 136/80, pulse 82, temperature 97.9 F (36.6 C), height 5\' 3"  (1.6 m), weight 160 lb (72.6 kg), SpO2 100%. Body mass index is 28.34 kg/m.  General: She is overweight, cooperative, alert, well developed, and in no acute distress. PSYCH: Has normal mood, affect and thought process.   HEENT: EOMI, sclerae are anicteric. Lungs: Normal breathing effort, no conversational dyspnea. Extremities: No edema.  Neurologic: No gross sensory or motor deficits. No tremors or fasciculations noted.    Diagnostic Data Reviewed:  BMET    Component Value Date/Time   NA 141 08/22/2023 1146   NA 142 06/27/2022 1007   K 4.8 08/22/2023 1146   CL 105 08/22/2023 1146   CO2 27 08/22/2023 1146   GLUCOSE 75 08/22/2023 1146   BUN 13 08/22/2023 1146   BUN 13 06/27/2022  1007   CREATININE 0.85 08/22/2023 1146   CALCIUM 10.3 08/22/2023 1146   GFRNONAA 66 12/07/2020 1518   GFRAA 77 12/07/2020 1518   Lab Results  Component Value Date   HGBA1C 6.0 (H) 05/02/2023   HGBA1C 6.4 (H) 06/27/2022   Lab Results  Component Value Date   INSULIN 23.1  06/27/2022   Lab Results  Component Value Date   TSH 1.070 06/27/2022   CBC    Component Value Date/Time   WBC 7.8 08/22/2023 1146   RBC 4.09 08/22/2023 1146   HGB 13.5 08/22/2023 1146   HCT 40.8 08/22/2023 1146   PLT 308 08/22/2023 1146   MCV 99.8 08/22/2023 1146   MCH 33.0 08/22/2023 1146   MCHC 33.1 08/22/2023 1146   RDW 13.0 08/22/2023 1146   Iron Studies No results found for: "IRON", "TIBC", "FERRITIN", "IRONPCTSAT" Lipid Panel     Component Value Date/Time   CHOL 234 (H) 06/27/2022 1007   TRIG 144 06/27/2022 1007   HDL 43 06/27/2022 1007   LDLCALC 165 (H) 06/27/2022 1007   Hepatic Function Panel     Component Value Date/Time   PROT 8.0 08/22/2023 1146   PROT 7.8 06/27/2022 1007   ALBUMIN 4.5 06/27/2022 1007   AST 26 08/22/2023 1146   ALT 21 08/22/2023 1146   ALKPHOS 88 06/27/2022 1007   BILITOT 0.7 08/22/2023 1146   BILITOT 0.5 06/27/2022 1007      Component Value Date/Time   TSH 1.070 06/27/2022 1007   Nutritional Lab Results  Component Value Date   VD25OH 51.7 10/18/2022   VD25OH 11.4 (L) 06/27/2022    Medications: Outpatient Encounter Medications as of 10/30/2023  Medication Sig   Adalimumab-ryvk, 2 Pen, 40 MG/0.4ML AJKT INJECT 40 MG UNDER THE SKIN EVERY 14 DAYS   amLODipine (NORVASC) 10 MG tablet Take 10 mg by mouth daily.   DULoxetine (CYMBALTA) 30 MG capsule Take 30 mg by mouth daily.   folic acid (FOLVITE) 1 MG tablet TAKE 2 TABLETS DAILY   losartan (COZAAR) 100 MG tablet Take 100 mg by mouth daily.   methotrexate (RHEUMATREX) 2.5 MG tablet Take 4 tablets (10 mg total) by mouth once a week.   ondansetron (ZOFRAN-ODT) 4 MG disintegrating tablet Take 1 tablet (4 mg total) by mouth every 8 (eight) hours as needed for nausea or vomiting.   tirzepatide (ZEPBOUND) 5 MG/0.5ML Pen Inject 5 mg into the skin once a week.   VITAMIN D PO Take by mouth.   No facility-administered encounter medications on file as of 10/30/2023.     Follow-Up   No  follow-ups on file.Marland Kitchen She was informed of the importance of frequent follow up visits to maximize her success with intensive lifestyle modifications for her multiple health conditions.  Attestation Statement   Reviewed by clinician on day of visit: allergies, medications, problem list, medical history, surgical history, family history, social history, and previous encounter notes.     Worthy Rancher, MD

## 2023-11-05 DIAGNOSIS — J101 Influenza due to other identified influenza virus with other respiratory manifestations: Secondary | ICD-10-CM | POA: Diagnosis not present

## 2023-11-05 DIAGNOSIS — R051 Acute cough: Secondary | ICD-10-CM | POA: Diagnosis not present

## 2023-11-05 DIAGNOSIS — J029 Acute pharyngitis, unspecified: Secondary | ICD-10-CM | POA: Diagnosis not present

## 2023-11-11 NOTE — Progress Notes (Deleted)
 Office Visit Note  Patient: Kari Williams             Date of Birth: 1960-11-10           MRN: [id number]             PCP: Renford Dills, MD Referring: Renford Dills, MD Visit Date: 11/20/2023   Subjective:  No chief complaint on file.   History of Present Illness: Kari Williams is a 63 y.o. 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 14 days and methotrexate 10 mg p.o. weekly and folic acid 2 mg daily.    Previous HPI 08/22/2023 [name] is a 63 y.o. 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 14 days and methotrexate 10 mg p.o. weekly and folic acid 2 mg daily. They report a decrease in methotrexate dosage to four tablets as directed after last visit and have been tolerating the Zepbound and metformin well. The patient has been experiencing nausea with Zepbound, but it has improved over time. They have been actively managing their weight, eating smaller portions, and have joined a fitness center where they engage in weight training twice a week.   The patient also reports dry eyes, which they have been managing with heated patches. They have an upcoming appointment with Dr. Sherryll Burger to further evaluate this issue as well as monitoring her uveitis.   The patient was on Humira, but has recently switched to a biosimilar [place] since about 3 months ago. They have not noticed any significant changes since the switch.   The patient has been experiencing shoulder pain, which they believe is due to a torn rotator cuff. The pain is located in the front of the shoulder and radiates down the arm. They have been managing the pain with exercises, but have noticed that certain movements, particularly those involving overhead lifting, exacerbate the pain.   Previous HPI 05/02/2023 Kari Williams is a 63 y.o. 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 14 days and  methotrexate 12.5 mg p.o. weekly and folic acid 2 mg daily.  No new changes with vision or episodes of eye redness or pain.  No new joint pain complaints she has partial improvement with the previous right hip and gluteal area tenderness and stiffness she has been working on this with exercise.  Is also working with healthy weight and wellness clinic has not had much weight change.  She discussed starting Zepbound with her PCP she is currently on metformin twice daily.  Has a new problem especially for the past few weeks now with dizziness first thing in the mornings gets better when she is up and moving for a while and after breakfast.   Previous HPI 01/24/2023 Kari Williams is a 63 y.o. 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 14 days and methotrexate 12.5 mg p.o. weekly and folic acid 2 mg daily.  Most recent liver enzyme tests from last visit were elevated but mildly so I will trend for monitoring methotrexate.  No new eye pain or visual change complaints.  Has been having intermittent pain at the right hip comes and goes.  Often gets worse moving after sitting in a prolonged fixed position or direct pressure such as lying on the right side.  Also having pain in the neck had an MRI  for this that demonstrated a bulging disc.  She is scheduled for steroid injection next Friday for this.   Previous HPI 10/11/22 Kari Williams is a 63 y.o. female here for follow up follow-up for uveitis and scleritis on Humira 40 mg subcu q. 14 days methotrexate 12.5 mg p.o. weekly and folic acid 2 mg daily.  Overall she is doing pretty well since last visit still gets some knee pain without a lot of swelling.  Eye dryness and mild irritation no serious pain inflammation or visual acuity changes.  Most recent ophthalmology exam looked good.  She had recent labs obtained earlier this month with a normal complete blood count metabolic panel showed mild AST elevation to 57.      Previous HPI 06/19/22 Kari Williams is a 63 y.o. 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 After last visit right knee pain improved and doing well now. She had follow up with Dr. Mason Sole eye exam negative for active uveitis although had some dryness and surface irritation. Getting restasis approved for this. She also has migraines which were a problem I the past but had been free of these for years until recently.    Previous HPI 03/15/22 Kari STREBEL is a 63 y.o. 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 She had a UTI in June treated with nitrofurantoin then switched to cefuroxime based on susceptibilities and symptoms resolved.  Overall she is doing well no symptomatic complaints with eye irritation or vision changes.  She has some right knee pain and stiffness does not severely limit her mobility.  Symptoms are most noticeable with certain movements such as descending stairs.     Previous HPI 12/13/2021 Kari Williams is a 63 y.o. female here for follow up for uveiitis and scleritis on methotrexate 12.5 mg PO weekly and Humira [place] q14days. Since our last visit her finger pain is improved not needing any additional NSAIDs for symptom management. She has floaters but no new visual complaint no eye redness or pain.   Previous HPI 09/13/2021 Kari Williams is a 63 y.o. female here for follow up for uveiitis and scleritis on methotrexate 15 mg PO weekly and Humira [place] q14days. Last visit with Dr. Mason Sole 08/01/21. Eyes are doing okay without new complaints and exam was okay. She has noticed some increased pain and swelling affecting left hand fingers also mildly tender nodule swelling behind her left ear.   Previous HPI 06/14/21 Kari Williams is a 63 y.o. 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 She has  experienced small painless skin rashes in a few places including the left elbow these improved with conservative treatment moisturizing. She has left calf cramps at night ongoing no problems during the day or with mobility. Bilateral eye dryness is slightly worse than before during day time, using lubricating eye drops more than before. She has future appointment with Dr. Mason Sole scheduled for next week.   Previous HPI 06/07/20 Kari Williams is a 63 y.o. 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 She has a previous history of treatment with methotrexate for iritis in 2010-2015 with Dr. Lasandra Points and Dr. Bernadine Briar. She had been off any treatment but visit with Dr. 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  Since earlier this year she has experienced eye redness watering and blurry vision.  She is more symptomatic complaint on the right than on the left currently but both sides bother her.  Besides the eye complaints she does also report some fairly diffuse arthralgias and morning stiffness up to 30 minutes.  She has not noticed any specific joint swelling warmth or erythema.  In the past she has also been treated with oral or ocular steroids but has not received any for the current relapse of symptoms.   No Rheumatology ROS completed.   PMFS History:  Patient Active Problem List   Diagnosis Date Noted   Nausea 06/20/2023   At increased risk for cardiovascular disease 05/23/2023   Hypercholesteremia 04/30/2023   Tendinopathy of right gluteus medius 01/24/2023   Class 1 obesity with serious comorbidity and body mass index (BMI) of 30.0 to 30.9 in adult 10/18/2022   Vitamin D deficiency 07/26/2022   Prediabetes 07/26/2022   Generalized obesity 07/26/2022   Migraines 06/19/2022   Pain in right knee 03/15/2022   Pain in left finger(s) 09/13/2021   Right ear pain 09/13/2021   Hemorrhage of rectum and anus 03/08/2021    Pain in right shoulder 03/08/2021   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   High risk medication use 06/07/2020   Carpal tunnel syndrome 11/29/2015   Hypertensive retinopathy of both eyes 11/01/2015   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   Nuclear sclerotic cataract of both eyes 10/13/2015   Epiretinal membrane (ERM) of right eye 10/13/2015   Lumbosacral radiculitis 09/01/2014   Encounter for routine gynecological examination 11/19/2011   Generalized abdominal or pelvic swelling or mass or lump 11/19/2011   Overweight (BMI 25.0-29.9) 11/19/2011   Fibroids 11/19/2011   HTN (hypertension) 11/19/2011   Condyloma 11/19/2011    Past Medical History:  Diagnosis Date   Allergy    seasonal   Anxiety    Arthritis    osteoarthritis/ddd   Bulging of cervical intervertebral disc    Chest pain    Depression    Female infertility    Hypertension    Rheumatoid arthritis (HCC)     Family History  Problem Relation Age of Onset   Dementia Mother    Hypotension Mother    Stroke Father    Hypertension Father    Sudden death Father    Breast cancer Sister    Hypertension Sister    Thyroid disease Sister    Colon cancer Brother    Sleep apnea Brother    Stroke Brother    Lupus Paternal Aunt    Lupus Niece    Past Surgical History:  Procedure Laterality Date   ABDOMINAL HYSTERECTOMY     CARPAL TUNNEL RELEASE  07/30/2000   right   COLONOSCOPY     LUMBAR DISC SURGERY  07/30/2004   LUMBAR FUSION  07/30/2009   THORACIC DISC SURGERY  07/30/2000   TUBAL LIGATION  07/02/2012   Procedure: BILATERAL TUBAL LIGATION;  Surgeon: Lula Sale, MD;  Location: [facility];  Service: Gynecology;  Laterality: Bilateral;   UPPER GASTROINTESTINAL ENDOSCOPY     VAGINAL HYSTERECTOMY  07/02/2012   Procedure: HYSTERECTOMY VAGINAL;  Surgeon: Lula Sale, MD;  Location: [facility];  Service: Gynecology;  Laterality: N/A;   Social History   Social History Narrative   Not on file   Immunization  History  Administered Date(s) Administered   Influenza Split 07/03/2012   Influenza, Seasonal, Injecte, Preservative Fre 05/28/2023   PFIZER(Purple Top)SARS-COV-2 Vaccination 10/13/2019,  11/03/2019     Objective: Vital Signs: There were no vitals taken for this visit.   Physical Exam   Musculoskeletal Exam: ***  CDAI Exam: CDAI Score: -- Patient Global: --; Provider Global: -- Swollen: --; Tender: -- Joint Exam 11/20/2023   No joint exam has been documented for this visit   There is currently no information documented on the homunculus. Go to the Rheumatology activity and complete the homunculus joint exam.  Investigation: No additional findings.  Imaging: No results found.  Recent Labs: Lab Results  Component Value Date   WBC 7.8 08/22/2023   HGB 13.5 08/22/2023   PLT 308 08/22/2023   NA 141 08/22/2023   K 4.8 08/22/2023   CL 105 08/22/2023   CO2 27 08/22/2023   GLUCOSE 75 08/22/2023   BUN 13 08/22/2023   CREATININE 0.85 08/22/2023   BILITOT 0.7 08/22/2023   ALKPHOS 88 06/27/2022   AST 26 08/22/2023   ALT 21 08/22/2023   PROT 8.0 08/22/2023   ALBUMIN 4.5 06/27/2022   CALCIUM 10.3 08/22/2023   GFRAA 77 12/07/2020   QFTBGOLDPLUS NEGATIVE 05/02/2023    Speciality Comments: No specialty comments available.  Procedures:  No procedures performed Allergies: Penicillins   Assessment / Plan:     Visit Diagnoses: No diagnosis found.  ***  Orders: No orders of the defined types were placed in this encounter.  No orders of the defined types were placed in this encounter.    Follow-Up Instructions: No follow-ups on file.   Glena Landau, RT  Note - This record has been created using [employer].  Chart creation errors have been sought, but may not always  have been located. Such creation errors do not reflect on  the standard of medical care.

## 2023-11-20 ENCOUNTER — Ambulatory Visit: Payer: BC Managed Care – PPO | Admitting: Internal Medicine

## 2023-11-20 DIAGNOSIS — M67951 Unspecified disorder of synovium and tendon, right thigh: Secondary | ICD-10-CM

## 2023-11-20 DIAGNOSIS — H04123 Dry eye syndrome of bilateral lacrimal glands: Secondary | ICD-10-CM

## 2023-11-20 DIAGNOSIS — H2013 Chronic iridocyclitis, bilateral: Secondary | ICD-10-CM

## 2023-11-20 DIAGNOSIS — H15003 Unspecified scleritis, bilateral: Secondary | ICD-10-CM

## 2023-11-20 DIAGNOSIS — Z79899 Other long term (current) drug therapy: Secondary | ICD-10-CM

## 2023-11-21 NOTE — Progress Notes (Unsigned)
 Office Visit Note  Patient: Kari Williams             Date of Birth: June 17, 1961           MRN: [id number]             PCP: Merl Star, MD Referring: Merl Star, MD Visit Date: 11/22/2023   Subjective:  No chief complaint on file.   History of Present Illness: Kari Williams is a 63 y.o. female here for follow up ***   Previous HPI 05/02/23  Kari Williams is a 63 y.o. 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 14 days and methotrexate  12.5 mg p.o. weekly and folic acid  2 mg daily.  No new changes with vision or episodes of eye redness or pain.  No new joint pain complaints she has partial improvement with the previous right hip and gluteal area tenderness and stiffness she has been working on this with exercise.  Is also working with healthy weight and wellness clinic has not had much weight change.  She discussed starting Zepbound  with her PCP she is currently on metformin  twice daily.  Has a new problem especially for the past few weeks now with dizziness first thing in the mornings gets better when she is up and moving for a while and after breakfast.   Previous HPI 01/24/2023 Kari Williams is a 63 y.o. 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 14 days and methotrexate  12.5 mg p.o. weekly and folic acid  2 mg daily.  Most recent liver enzyme tests from last visit were elevated but mildly so I will trend for monitoring methotrexate .  No new eye pain or visual change complaints.  Has been having intermittent pain at the right hip comes and goes.  Often gets worse moving after sitting in a prolonged fixed position or direct pressure such as lying on the right side.  Also having pain in the neck had an MRI for this that demonstrated a bulging disc.  She is scheduled for steroid injection next Friday for this.   Previous HPI 10/11/22 Kari Williams is a 63 y.o. female here for follow up  follow-up for uveitis and scleritis on Humira  40 mg subcu q. 14 days methotrexate  12.5 mg p.o. weekly and folic acid  2 mg daily.  Overall she is doing pretty well since last visit still gets some knee pain without a lot of swelling.  Eye dryness and mild irritation no serious pain inflammation or visual acuity changes.  Most recent ophthalmology exam looked good.  She had recent labs obtained earlier this month with a normal complete blood count metabolic panel showed mild AST elevation to 57.     Previous HPI 06/19/22 Kari Williams is a 63 y.o. 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 After last visit right knee pain improved and doing well now. She had follow up with Dr. Mason Sole eye exam negative for active uveitis although had some dryness and surface irritation. Getting restasis approved for this. She also has migraines which were a problem I the past but had been free of these for years until recently.    Previous HPI 03/15/22 Kari Williams is a 63 y.o. 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 She had a UTI in  June treated with nitrofurantoin then switched to cefuroxime based on susceptibilities and symptoms resolved.  Overall she is doing well no symptomatic complaints with eye irritation or vision changes.  She has some right knee pain and stiffness does not severely limit her mobility.  Symptoms are most noticeable with certain movements such as descending stairs.     Previous HPI 12/13/2021 Kari Williams is a 63 y.o. female here for follow up for uveiitis and scleritis on methotrexate  12.5 mg PO weekly and Humira  [place] q14days. Since our last visit her finger pain is improved not needing any additional NSAIDs for symptom management. She has floaters but no new visual complaint no eye redness or pain.   Previous HPI 09/13/2021 Kari Williams is a 63 y.o. female here for  follow up for uveiitis and scleritis on methotrexate  15 mg PO weekly and Humira  [place] q14days. Last visit with Dr. Mason Sole 08/01/21. Eyes are doing okay without new complaints and exam was okay. She has noticed some increased pain and swelling affecting left hand fingers also mildly tender nodule swelling behind her left ear.   Previous HPI 06/14/21 Kari Williams is a 63 y.o. 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 She has experienced small painless skin rashes in a few places including the left elbow these improved with conservative treatment moisturizing. She has left calf cramps at night ongoing no problems during the day or with mobility. Bilateral eye dryness is slightly worse than before during day time, using lubricating eye drops more than before. She has future appointment with Dr. Mason Sole scheduled for next week.   Previous HPI 06/07/20 Kari Williams is a 63 y.o. 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 She has a previous history of treatment with methotrexate  for iritis in 2010-2015 with Dr. Lasandra Points and Dr. Bernadine Briar. She had been off any treatment but visit with Dr. 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  Since earlier this year she has experienced eye redness watering and blurry vision.  She is more symptomatic complaint on the right than on the left currently but both sides bother her.  Besides the eye complaints she does also report some fairly diffuse arthralgias and morning stiffness up to 30 minutes.  She has not noticed any specific joint swelling warmth or erythema.  In the past she has also been treated with oral or ocular steroids but has not received any for the current relapse of symptoms.   No Rheumatology ROS completed.   PMFS History:  Patient Active Problem List   Diagnosis Date Noted   Nausea 06/20/2023   At increased  risk for cardiovascular disease 05/23/2023   Hypercholesteremia 04/30/2023   Tendinopathy of right gluteus medius 01/24/2023   Class 1 obesity with serious comorbidity and body mass index (BMI) of 30.0 to 30.9 in adult 10/18/2022   Vitamin D  deficiency 07/26/2022   Prediabetes 07/26/2022   Generalized obesity 07/26/2022   Migraines 06/19/2022   Pain in right knee 03/15/2022   Pain in left finger(s) 09/13/2021   Right ear pain 09/13/2021   Hemorrhage of rectum and anus 03/08/2021   Pain in right shoulder 03/08/2021   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   High risk medication use 06/07/2020   Carpal tunnel syndrome 11/29/2015   Hypertensive retinopathy of both eyes 11/01/2015   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   Nuclear sclerotic  cataract of both eyes 10/13/2015   Epiretinal membrane (ERM) of right eye 10/13/2015   Lumbosacral radiculitis 09/01/2014   Encounter for routine gynecological examination 11/19/2011   Generalized abdominal or pelvic swelling or mass or lump 11/19/2011   Overweight (BMI 25.0-29.9) 11/19/2011   Fibroids 11/19/2011   HTN (hypertension) 11/19/2011   Condyloma 11/19/2011    Past Medical History:  Diagnosis Date   Allergy    seasonal   Anxiety    Arthritis    osteoarthritis/ddd   Bulging of cervical intervertebral disc    Chest pain    Depression    Female infertility    Hypertension    Rheumatoid arthritis (HCC)     Family History  Problem Relation Age of Onset   Dementia Mother    Hypotension Mother    Stroke Father    Hypertension Father    Sudden death Father    Breast cancer Sister    Hypertension Sister    Thyroid  disease Sister    Colon cancer Brother    Sleep apnea Brother    Stroke Brother    Lupus Paternal Aunt    Lupus Niece    Past Surgical History:  Procedure Laterality Date   ABDOMINAL HYSTERECTOMY     CARPAL TUNNEL RELEASE  07/30/2000   right   COLONOSCOPY     LUMBAR DISC SURGERY  07/30/2004   LUMBAR FUSION   07/30/2009   THORACIC DISC SURGERY  07/30/2000   TUBAL LIGATION  07/02/2012   Procedure: BILATERAL TUBAL LIGATION;  Surgeon: Lula Sale, MD;  Location: [facility];  Service: Gynecology;  Laterality: Bilateral;   UPPER GASTROINTESTINAL ENDOSCOPY     VAGINAL HYSTERECTOMY  07/02/2012   Procedure: HYSTERECTOMY VAGINAL;  Surgeon: Lula Sale, MD;  Location: [facility];  Service: Gynecology;  Laterality: N/A;   Social History   Social History Narrative   Not on file   Immunization History  Administered Date(s) Administered   Influenza Split 07/03/2012   Influenza, Seasonal, Injecte, Preservative Fre 05/28/2023   PFIZER(Purple Top)SARS-COV-2 Vaccination 10/13/2019, 11/03/2019     Objective: Vital Signs: There were no vitals taken for this visit.   Physical Exam   Musculoskeletal Exam: ***  CDAI Exam: CDAI Score: -- Patient Global: --; Provider Global: -- Swollen: --; Tender: -- Joint Exam 11/22/2023   No joint exam has been documented for this visit   There is currently no information documented on the homunculus. Go to the Rheumatology activity and complete the homunculus joint exam.  Investigation: No additional findings.  Imaging: No results found.  Recent Labs: Lab Results  Component Value Date   WBC 7.8 08/22/2023   HGB 13.5 08/22/2023   PLT 308 08/22/2023   NA 141 08/22/2023   K 4.8 08/22/2023   CL 105 08/22/2023   CO2 27 08/22/2023   GLUCOSE 75 08/22/2023   BUN 13 08/22/2023   CREATININE 0.85 08/22/2023   BILITOT 0.7 08/22/2023   ALKPHOS 88 06/27/2022   AST 26 08/22/2023   ALT 21 08/22/2023   PROT 8.0 08/22/2023   ALBUMIN 4.5 06/27/2022   CALCIUM 10.3 08/22/2023   GFRAA 77 12/07/2020   QFTBGOLDPLUS NEGATIVE 05/02/2023    Speciality Comments: No specialty comments available.  Procedures:  No procedures performed Allergies: Penicillins   Assessment / Plan:     Visit Diagnoses: No diagnosis found.  ***  Orders: No orders of the defined  types were placed in this encounter.  No orders of the defined types were placed in this  encounter.    Follow-Up Instructions: No follow-ups on file.   Matt Song, MD  Note - This record has been created using [employer].  Chart creation errors have been sought, but may not always  have been located. Such creation errors do not reflect on  the standard of medical care.

## 2023-11-22 ENCOUNTER — Ambulatory Visit: Attending: Internal Medicine | Admitting: Internal Medicine

## 2023-11-22 ENCOUNTER — Encounter: Payer: Self-pay | Admitting: Internal Medicine

## 2023-11-22 VITALS — BP 119/78 | HR 79 | Resp 17 | Ht 63.0 in | Wt 158.6 lb

## 2023-11-22 DIAGNOSIS — M25511 Pain in right shoulder: Secondary | ICD-10-CM

## 2023-11-22 DIAGNOSIS — G8929 Other chronic pain: Secondary | ICD-10-CM

## 2023-11-22 DIAGNOSIS — H2013 Chronic iridocyclitis, bilateral: Secondary | ICD-10-CM

## 2023-11-22 DIAGNOSIS — H15003 Unspecified scleritis, bilateral: Secondary | ICD-10-CM

## 2023-11-22 DIAGNOSIS — Z79899 Other long term (current) drug therapy: Secondary | ICD-10-CM | POA: Diagnosis not present

## 2023-11-23 LAB — CBC WITH DIFFERENTIAL/PLATELET
Absolute Lymphocytes: 2270 {cells}/uL (ref 850–3900)
Absolute Monocytes: 667 {cells}/uL (ref 200–950)
Basophils Absolute: 53 {cells}/uL (ref 0–200)
Basophils Relative: 0.8 %
Eosinophils Absolute: 40 {cells}/uL (ref 15–500)
Eosinophils Relative: 0.6 %
HCT: 38.2 % (ref 35.0–45.0)
Hemoglobin: 12.8 g/dL (ref 11.7–15.5)
MCH: 33.6 pg — ABNORMAL HIGH (ref 27.0–33.0)
MCHC: 33.5 g/dL (ref 32.0–36.0)
MCV: 100.3 fL — ABNORMAL HIGH (ref 80.0–100.0)
MPV: 9.4 fL (ref 7.5–12.5)
Monocytes Relative: 10.1 %
Neutro Abs: 3571 {cells}/uL (ref 1500–7800)
Neutrophils Relative %: 54.1 %
Platelets: 362 10*3/uL (ref 140–400)
RBC: 3.81 10*6/uL (ref 3.80–5.10)
RDW: 12.8 % (ref 11.0–15.0)
Total Lymphocyte: 34.4 %
WBC: 6.6 10*3/uL (ref 3.8–10.8)

## 2023-11-23 LAB — COMPREHENSIVE METABOLIC PANEL WITH GFR
AG Ratio: 1.7 (calc) (ref 1.0–2.5)
ALT: 17 U/L (ref 6–29)
AST: 21 U/L (ref 10–35)
Albumin: 4.5 g/dL (ref 3.6–5.1)
Alkaline phosphatase (APISO): 72 U/L (ref 37–153)
BUN: 10 mg/dL (ref 7–25)
CO2: 30 mmol/L (ref 20–32)
Calcium: 9.7 mg/dL (ref 8.6–10.4)
Chloride: 108 mmol/L (ref 98–110)
Creat: 0.82 mg/dL (ref 0.50–1.05)
Globulin: 2.7 g/dL (ref 1.9–3.7)
Glucose, Bld: 83 mg/dL (ref 65–99)
Potassium: 4.8 mmol/L (ref 3.5–5.3)
Sodium: 142 mmol/L (ref 135–146)
Total Bilirubin: 0.6 mg/dL (ref 0.2–1.2)
Total Protein: 7.2 g/dL (ref 6.1–8.1)
eGFR: 80 mL/min/{1.73_m2} (ref >=60)

## 2023-11-23 LAB — SEDIMENTATION RATE: Sed Rate: 17 mm/h (ref 0–30)

## 2023-12-20 ENCOUNTER — Other Ambulatory Visit: Payer: Self-pay | Admitting: Internal Medicine

## 2023-12-20 DIAGNOSIS — H15003 Unspecified scleritis, bilateral: Secondary | ICD-10-CM

## 2023-12-20 NOTE — Telephone Encounter (Signed)
 Last Fill: 10/04/2023  Labs: 11/22/2023 MCV 100.3 MCH 33.6  Next Visit: 02/26/2024  Last Visit: 11/22/2023  DX: Bilateral scleritis   Current Dose per office note 11/22/2023: methotrexate  10 mg p.o. weekly   Okay to refill Methotrexate ?

## 2023-12-24 ENCOUNTER — Telehealth: Payer: Self-pay | Admitting: *Deleted

## 2023-12-24 NOTE — Telephone Encounter (Signed)
 Prior authorization done via cover my meds for patients Zepbound. Waiting on determination.

## 2023-12-29 ENCOUNTER — Other Ambulatory Visit (INDEPENDENT_AMBULATORY_CARE_PROVIDER_SITE_OTHER): Payer: Self-pay | Admitting: Internal Medicine

## 2023-12-29 DIAGNOSIS — Z6830 Body mass index (BMI) 30.0-30.9, adult: Secondary | ICD-10-CM

## 2023-12-29 DIAGNOSIS — R7303 Prediabetes: Secondary | ICD-10-CM

## 2024-01-01 ENCOUNTER — Ambulatory Visit (INDEPENDENT_AMBULATORY_CARE_PROVIDER_SITE_OTHER): Admitting: Internal Medicine

## 2024-01-01 ENCOUNTER — Encounter (INDEPENDENT_AMBULATORY_CARE_PROVIDER_SITE_OTHER): Payer: Self-pay | Admitting: Internal Medicine

## 2024-01-01 VITALS — BP 121/76 | HR 76 | Temp 98.5°F | Ht 63.0 in | Wt 157.0 lb

## 2024-01-01 DIAGNOSIS — Z6827 Body mass index (BMI) 27.0-27.9, adult: Secondary | ICD-10-CM

## 2024-01-01 DIAGNOSIS — R7303 Prediabetes: Secondary | ICD-10-CM

## 2024-01-01 DIAGNOSIS — E669 Obesity, unspecified: Secondary | ICD-10-CM | POA: Diagnosis not present

## 2024-01-01 DIAGNOSIS — I10 Essential (primary) hypertension: Secondary | ICD-10-CM

## 2024-01-01 NOTE — Progress Notes (Unsigned)
 Office: [phone]  /  Fax: [phone]  Weight Summary And Biometrics  Vitals Temp: 98.5 F (36.9 C) BP: 121/76 Pulse Rate: 76 SpO2: 100 %   Anthropometric Measurements Height: 5\' 3"  (1.6 m) Weight: 157 lb (71.2 kg) BMI (Calculated): 27.82 Weight at Last Visit: 160 lb Weight Lost Since Last Visit: 3 lb Weight Gained Since Last Visit: 0 lb Starting Weight: 180 lb Total Weight Loss (lbs): 23 lb (10.4 kg) Peak Weight: 230 lb   Body Composition  Body Fat %: 32.5 % Fat Mass (lbs): 51 lbs Muscle Mass (lbs): 100.6 lbs Total Body Water (lbs): 72.6 lbs Visceral Fat Rating : 9    RMR: 1742  Today's Visit #: 18  Starting Date: 06/27/22   Subjective   Chief Complaint: Obesity  Interval History Discussed the use of AI scribe software for clinical note transcription with the patient, who gave verbal consent to proceed.  History of Present Illness   Kari Williams is a 63 year old female who presents for medical weight management.  She has lost three pounds since her last visit, totaling a weight loss of twenty-three pounds, with a current weight of 157 pounds and a BMI of 27. She adheres to a 1200 calorie nutrition plan 70-80% of the time, tracks her calories, consumes more whole foods, ensures adequate protein intake, and maintains hydration. She exercises five days a week for 30 to 60 minutes, including strengthening, cardio, and yoga. No skipped meals.  She is currently taking Zepbound  5 mg once a week, which she started in October of the previous year. Her peak weight was 230 pounds, and she has lost about 32% of her total body weight since then, with a 13% reduction since starting the program.  During a recent vacation to [place], she indulged in some 'guilty pleasures' such as [place] toast and a drink made with vodka and ginger juice, which she humorously described as 'medicinal.' Despite these indulgences, she continues to focus on her nutrition and  exercise regimen.  She has a history of taking medications like biologic and methotrexate  for rheumatoid arthritis and occasionally requires steroids for inflammation.       Challenges affecting patient progress: none.    Pharmacotherapy for weight management: She is currently taking Zepbound  with adequate clinical response  and without side effects..   Assessment and Plan   Treatment Plan For Obesity:  Recommended Dietary Goals  Kari Williams is currently in the action stage of change. As such, her goal is to continue weight management plan. She has agreed to: continue current plan  Behavioral Health and Counseling  We discussed the following behavioral modification strategies today: continue to work on maintaining a reduced calorie state, getting the recommended amount of protein, incorporating whole foods, making healthy choices, staying well hydrated and practicing mindfulness when eating..  Additional education and resources provided today: None  Recommended Physical Activity Goals  Kari Williams has been advised to work up to 150 minutes of moderate intensity aerobic activity a week and strengthening exercises 2-3 times per week for cardiovascular health, weight loss maintenance and preservation of muscle mass.   She has agreed to :  Continue current level of physical activity   Pharmacotherapy  We discussed various medication options to help Kari Williams with her weight loss efforts and we both agreed to : Adequate clinical response to anti-obesity medication, continue current regimen  Associated Conditions Impacted by Obesity Treatment  Primary hypertension  Prediabetes  Generalized obesity with current BMI of 27  Assessment and Plan    Obesity She has lost 23 pounds since starting the weight management program, with a current BMI of 27. Zepbound  5 mg weekly, initiated in October last year, has contributed to her weight loss by providing satiety. She adheres to a 1200  calorie nutrition plan 70-80% of the time and exercises five days a week. Transitioning to a maintenance phase to stabilize her weight at 157 pounds is under consideration. The goal is to maintain her current weight by increasing caloric intake to 1300-1400 calories daily. The risk of regaining weight is present, especially given her previous peak weight of 230 pounds, but maintaining her current regimen and gradually adjusting her diet can help mitigate this risk. - Continue Zepbound  5 mg weekly - Increase daily caloric intake to 1300-1400 calories to maintain current weight - Focus on increasing intake of fruits, vegetables, and protein - Continue regular exercise regimen - Consider using AI meal planners for personalized meal plans - Schedule follow-up in 8 weeks  Rheumatoid Arthritis She is on Humira  and methotrexate , which can contribute to weight gain. She has been managing her weight effectively despite these medications. Awareness of potential weight gain from these medications is important in her weight management strategy.  Hypertension Blood pressure is well-controlled at 124/70 mmHg.  Hyperlipidemia Cholesterol level is 112 mg/dL, slightly above the target of less than 100 mg/dL.  General Health Maintenance She is actively engaged in maintaining a healthy lifestyle through diet and exercise. Incorporating healthy fats such as nuts, avocados, and olive oil can help increase caloric intake while maintaining nutritional balance. - Encourage continued healthy eating habits and regular exercise - Discuss the use of healthy fats such as nuts, avocados, and olive oil to increase caloric intake        Objective   Physical Exam:  Blood pressure 121/76, pulse 76, temperature 98.5 F (36.9 C), height 5\' 3"  (1.6 m), weight 157 lb (71.2 kg), SpO2 100%. Body mass index is 27.81 kg/m.  General: She is overweight, cooperative, alert, well developed, and in no acute distress. PSYCH: Has  normal mood, affect and thought process.   HEENT: EOMI, sclerae are anicteric. Lungs: Normal breathing effort, no conversational dyspnea. Extremities: No edema.  Neurologic: No gross sensory or motor deficits. No tremors or fasciculations noted.    Diagnostic Data Reviewed:  BMET    Component Value Date/Time   NA 142 11/22/2023 1133   NA 142 06/27/2022 1007   K 4.8 11/22/2023 1133   CL 108 11/22/2023 1133   CO2 30 11/22/2023 1133   GLUCOSE 83 11/22/2023 1133   BUN 10 11/22/2023 1133   BUN 13 06/27/2022 1007   CREATININE 0.82 11/22/2023 1133   CALCIUM 9.7 11/22/2023 1133   GFRNONAA 66 12/07/2020 1518   GFRAA 77 12/07/2020 1518   Lab Results  Component Value Date   HGBA1C 6.0 (H) 05/02/2023   HGBA1C 6.4 (H) 06/27/2022   Lab Results  Component Value Date   INSULIN  23.1 06/27/2022   Lab Results  Component Value Date   TSH 1.070 06/27/2022   CBC    Component Value Date/Time   WBC 6.6 11/22/2023 1133   RBC 3.81 11/22/2023 1133   HGB 12.8 11/22/2023 1133   HCT 38.2 11/22/2023 1133   PLT 362 11/22/2023 1133   MCV 100.3 (H) 11/22/2023 1133   MCH 33.6 (H) 11/22/2023 1133   MCHC 33.5 11/22/2023 1133   RDW 12.8 11/22/2023 1133   Iron Studies No results found for: "IRON", "  TIBC", "FERRITIN", "IRONPCTSAT" Lipid Panel     Component Value Date/Time   CHOL 234 (H) 06/27/2022 1007   TRIG 144 06/27/2022 1007   HDL 43 06/27/2022 1007   LDLCALC 165 (H) 06/27/2022 1007   Hepatic Function Panel     Component Value Date/Time   PROT 7.2 11/22/2023 1133   PROT 7.8 06/27/2022 1007   ALBUMIN 4.5 06/27/2022 1007   AST 21 11/22/2023 1133   ALT 17 11/22/2023 1133   ALKPHOS 88 06/27/2022 1007   BILITOT 0.6 11/22/2023 1133   BILITOT 0.5 06/27/2022 1007      Component Value Date/Time   TSH 1.070 06/27/2022 1007   Nutritional Lab Results  Component Value Date   VD25OH 51.7 10/18/2022   VD25OH 11.4 (L) 06/27/2022    Medications: Outpatient Encounter Medications as of  01/01/2024  Medication Sig   Adalimumab -ryvk, 2 Pen, 40 MG/0.4ML AJKT INJECT 40 MG UNDER THE SKIN EVERY 14 DAYS   amLODipine  (NORVASC ) 10 MG tablet Take 10 mg by mouth daily.   DULoxetine (CYMBALTA) 30 MG capsule Take 30 mg by mouth daily.   folic acid  (FOLVITE ) 1 MG tablet TAKE 2 TABLETS DAILY   losartan (COZAAR) 100 MG tablet Take 100 mg by mouth daily.   methotrexate  (RHEUMATREX) 2.5 MG tablet TAKE 4 TABLETS ONCE A WEEK   Multiple Vitamins-Minerals (MULTIVITAMIN WITH MINERALS) tablet Take 1 tablet by mouth daily.   ondansetron  (ZOFRAN -ODT) 4 MG disintegrating tablet Take 1 tablet (4 mg total) by mouth every 8 (eight) hours as needed for nausea or vomiting.   VITAMIN D  PO Take by mouth.   ZEPBOUND  5 MG/0.5ML Pen INJECT 5 MG UNDER THE SKIN WEEKLY   No facility-administered encounter medications on file as of 01/01/2024.     Follow-Up   Return in about 2 months (around 03/02/2024) for For Weight Mangement with Dr. Allie Area.Aaron Aas She was informed of the importance of frequent follow up visits to maximize her success with intensive lifestyle modifications for her multiple health conditions.  Attestation Statement   Reviewed by clinician on day of visit: allergies, medications, problem list, medical history, surgical history, family history, social history, and previous encounter notes.     Ladd Picker, MD

## 2024-01-03 ENCOUNTER — Other Ambulatory Visit: Payer: Self-pay | Admitting: Internal Medicine

## 2024-01-03 NOTE — Telephone Encounter (Signed)
 Last Fill: 10/21/2023  Labs: 11/22/2023 MCV 100.3 MCH 33.6  TB Gold: 05/02/2023 Negative   Next Visit: 02/26/2024  Last Visit: 11/22/2023  DX:Bilateral scleritis   Current Dose per office note 11/22/2023: [place] 40 mg subcu q. 14 days   Okay to refill Humira ?

## 2024-01-28 ENCOUNTER — Other Ambulatory Visit: Payer: Self-pay | Admitting: Internal Medicine

## 2024-01-28 NOTE — Telephone Encounter (Signed)
 Last Fill: 01/25/2023  Next Visit: 02/26/2024  Last Visit: 11/22/2023  Dx: Bilateral scleritis   Current Dose per office note on 11/22/2023: folic acid  2 mg daily   Okay to refill Folic Acid ?

## 2024-01-28 NOTE — Telephone Encounter (Signed)
 Prior authorization denied for patients Zepbound .

## 2024-02-24 NOTE — Progress Notes (Signed)
 Office Visit Note  Patient: Kari Williams             Date of Birth: 01/06/1961           MRN: [id number]             PCP: Rexanne Ingle, MD Referring: Rexanne Ingle, MD Visit Date: 02/26/2024   Subjective:  Follow-up   Discussed the use of AI scribe software for clinical note transcription with the patient, who gave verbal consent to proceed.  History of Present Illness   Kari Williams is a 63 y.o. female here for follow up for uveitis and scleritis on adalimumab  40 mg subcu q. 14 days and methotrexate  10 mg p.o. weekly and folic acid  2 mg daily.  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  She has been experiencing persistent shoulder pain for about a week, described as a constant, nagging sensation across the collarbone and into the neck area. Initially managed with 400 mg of ibuprofen  twice daily, she increased the dose to 800 mg due to persistent discomfort. She is concerned about potential gastrointestinal side effects of ibuprofen .  Five years ago, she was diagnosed with a 90% rotator cuff tear confirmed by imaging. She opted for physical therapy and exercises instead of surgery, which helped regain some mobility and reduce pain. Symptoms had been going well until this year with persistent pain and stifness now. She has done several exercises for her shoulder without much relief for the past 3 months.  She has not received any injections in the shoulder but has had a neck injection for unrelated neck issues. She uses a heating pad and ice pad to manage the pain and performs shoulder exercises to maintain mobility.  No vision changes, eye pain, or redness. She uses a heated eye mask at night to aid sleep.       Previous HPI 11/22/2023 Kari Williams is a 63 y.o. female here for follow up for uveitis and scleritis on adalimumab  40 mg subcu q. 14 days and methotrexate  10 mg p.o. weekly and folic acid  2 mg daily.    She has been managing her prediabetes with Zepbound  for over a year, resulting in an improvement in her A1c levels from 6 to 5. She has also lost 14 pounds since her last visit, attributing this to dietary changes and medication, which have improved her satiety recognition and aided in weight management.   She is currently taking methotrexate , with a dosage of four tablets on Thursdays, for her rheumatoid arthritis. She is dissatisfied with the biosimilar medication's delivery system, which is dictated by her insurance. Her eyes have been less dry since incorporating heated eye pads into her nightly routine for relaxation.   Approximately three weeks ago, she had the flu, confirmed by a test at a walk-in clinic. Her symptoms included headache, cough, and a scratchy throat. She was prescribed Tamiflu and a nasal spray, which she used three times a day. She still has a lingering cough and has been using glycerin pills for relief.   She has a history of shoulder issues, specifically with her rotator cuff, causing some nagging discomfort but not significant pain. Her range of motion is good, and she can perform daily activities without much limitation. Surgery has been advised against due to the manageable nature of her symptoms.         Previous HPI 05/02/23  Kari Williams is a  63 y.o. 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 14 days and methotrexate  12.5 mg p.o. weekly and folic acid  2 mg daily.  No new changes with vision or episodes of eye redness or pain.  No new joint pain complaints she has partial improvement with the previous right hip and gluteal area tenderness and stiffness she has been working on this with exercise.  Is also working with healthy weight and wellness clinic has not had much weight change.  She discussed starting Zepbound  with her PCP she is currently on metformin  twice daily.  Has a new problem especially for the past few weeks now with  dizziness first thing in the mornings gets better when she is up and moving for a while and after breakfast.   Previous HPI 01/24/2023 Kari Williams is a 63 y.o. 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 14 days and methotrexate  12.5 mg p.o. weekly and folic acid  2 mg daily.  Most recent liver enzyme tests from last visit were elevated but mildly so I will trend for monitoring methotrexate .  No new eye pain or visual change complaints.  Has been having intermittent pain at the right hip comes and goes.  Often gets worse moving after sitting in a prolonged fixed position or direct pressure such as lying on the right side.  Also having pain in the neck had an MRI for this that demonstrated a bulging disc.  She is scheduled for steroid injection next Friday for this.   Previous HPI 10/11/22 Kari Williams is a 63 y.o. female here for follow up follow-up for uveitis and scleritis on Humira  40 mg subcu q. 14 days methotrexate  12.5 mg p.o. weekly and folic acid  2 mg daily.  Overall she is doing pretty well since last visit still gets some knee pain without a lot of swelling.  Eye dryness and mild irritation no serious pain inflammation or visual acuity changes.  Most recent ophthalmology exam looked good.  She had recent labs obtained earlier this month with a normal complete blood count metabolic panel showed mild AST elevation to 57.     Previous HPI 06/19/22 Kari Williams is a 63 y.o. female here for follow up for uveitis and scleritis on Humira  40 mg [place] q14days and MTX 12.5 mg PO weekly and folic acid  2 mg daily. After last visit right knee pain improved and doing well now. She had follow up with Dr. Maree eye exam negative for active uveitis although had some dryness and surface irritation. Getting restasis approved for this. She also has migraines which were a problem I the past but had been free of these for years until recently.    Previous  HPI 03/15/22 Kari Williams is a 63 y.o. 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 She had a UTI in June treated with nitrofurantoin then switched to cefuroxime based on susceptibilities and symptoms resolved.  Overall she is doing well no symptomatic complaints with eye irritation or vision changes.  She has some right knee pain and stiffness does not severely limit her mobility.  Symptoms are most noticeable with certain movements such as descending stairs.     Previous HPI 12/13/2021 Kari Williams is a 63 y.o. female here for follow up for uveiitis and scleritis on methotrexate  12.5 mg PO weekly and Humira  [place] q14days. Since our last visit her finger pain is improved  not needing any additional NSAIDs for symptom management. She has floaters but no new visual complaint no eye redness or pain.   Previous HPI 09/13/2021 Kari Williams is a 63 y.o. female here for follow up for uveiitis and scleritis on methotrexate  15 mg PO weekly and Humira  [place] q14days. Last visit with Dr. Maree 08/01/21. Eyes are doing okay without new complaints and exam was okay. She has noticed some increased pain and swelling affecting left hand fingers also mildly tender nodule swelling behind her left ear.   Previous HPI 06/14/21 Kari Williams is a 63 y.o. female here for follow up for uveitis and scleritis on methotrexate  15 mg PO weekly and Humira  40 mg [place] q14days. She has experienced small painless skin rashes in a few places including the left elbow these improved with conservative treatment moisturizing. She has left calf cramps at night ongoing no problems during the day or with mobility. Bilateral eye dryness is slightly worse than before during day time, using lubricating eye drops more than before. She has future appointment with Dr. Maree scheduled for next week.   Previous HPI 06/07/20 Kari Williams is a 63 y.o. 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 She has a  previous history of treatment with methotrexate  for iritis in 2010-2015 with Dr. Cleatus and Dr. Everlean. She had been off any treatment but visit with Dr. 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  Since earlier this year she has experienced eye redness watering and blurry vision.  She is more symptomatic complaint on the right than on the left currently but both sides bother her.  Besides the eye complaints she does also report some fairly diffuse arthralgias and morning stiffness up to 30 minutes.  She has not noticed any specific joint swelling warmth or erythema.  In the past she has also been treated with oral or ocular steroids but has not received any for the current relapse of symptoms.   Review of Systems  Constitutional:  Negative for fatigue.  HENT:  Negative for mouth sores and mouth dryness.   Eyes:  Negative for dryness.  Respiratory:  Negative for shortness of breath.   Cardiovascular:  Negative for chest pain and palpitations.  Gastrointestinal:  Negative for blood in stool, constipation and diarrhea.  Endocrine: Negative for increased urination.  Genitourinary:  Negative for involuntary urination.  Musculoskeletal:  Positive for joint pain, joint pain and morning stiffness. Negative for gait problem, joint swelling, myalgias, muscle weakness, muscle tenderness and myalgias.  Skin:  Negative for color change, rash, hair loss and sensitivity to sunlight.  Allergic/Immunologic: Negative for susceptible to infections.  Neurological:  Negative for dizziness and headaches.  Hematological:  Negative for swollen glands.  Psychiatric/Behavioral:  Positive for depressed mood and sleep disturbance. The patient is not nervous/anxious.     PMFS History:  Patient Active Problem List   Diagnosis Date Noted   Nausea 06/20/2023   At increased risk for cardiovascular disease 05/23/2023   Hypercholesteremia 04/30/2023    Tendinopathy of right gluteus medius 01/24/2023   Class 1 obesity with serious comorbidity and body mass index (BMI) of 30.0 to 30.9 in adult 10/18/2022   Vitamin D  deficiency 07/26/2022   Prediabetes 07/26/2022   Generalized obesity 07/26/2022   Migraines 06/19/2022   Pain in right knee 03/15/2022   Pain in left finger(s) 09/13/2021   Right ear pain 09/13/2021   Hemorrhage of rectum and anus 03/08/2021  Pain in right shoulder 03/08/2021   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   High risk medication use 06/07/2020   Carpal tunnel syndrome 11/29/2015   Hypertensive retinopathy of both eyes 11/01/2015   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   Nuclear sclerotic cataract of both eyes 10/13/2015   Epiretinal membrane (ERM) of right eye 10/13/2015   Lumbosacral radiculitis 09/01/2014   Encounter for routine gynecological examination 11/19/2011   Generalized abdominal or pelvic swelling or mass or lump 11/19/2011   Overweight (BMI 25.0-29.9) 11/19/2011   Fibroids 11/19/2011   HTN (hypertension) 11/19/2011   Condyloma 11/19/2011    Past Medical History:  Diagnosis Date   Allergy    seasonal   Anxiety    Arthritis    osteoarthritis/ddd   Bulging of cervical intervertebral disc    Chest pain    Depression    Female infertility    Hypertension    Rheumatoid arthritis (HCC)     Family History  Problem Relation Age of Onset   Dementia Mother    Hypotension Mother    Stroke Father    Hypertension Father    Sudden death Father    Breast cancer Sister    Hypertension Sister    Thyroid  disease Sister    Colon cancer Brother    Sleep apnea Brother    Stroke Brother    Lupus Paternal Aunt    Lupus Niece    Past Surgical History:  Procedure Laterality Date   ABDOMINAL HYSTERECTOMY     CARPAL TUNNEL RELEASE  07/30/2000   right   COLONOSCOPY     LUMBAR DISC SURGERY  07/30/2004   LUMBAR FUSION  07/30/2009   THORACIC DISC SURGERY  07/30/2000   TUBAL LIGATION  07/02/2012    Procedure: BILATERAL TUBAL LIGATION;  Surgeon: Rome Rigg, MD;  Location: [facility];  Service: Gynecology;  Laterality: Bilateral;   UPPER GASTROINTESTINAL ENDOSCOPY     VAGINAL HYSTERECTOMY  07/02/2012   Procedure: HYSTERECTOMY VAGINAL;  Surgeon: Rome Rigg, MD;  Location: [facility];  Service: Gynecology;  Laterality: N/A;   Social History   Social History Narrative   Not on file   Immunization History  Administered Date(s) Administered   Influenza Split 07/03/2012   Influenza, Seasonal, Injecte, Preservative Fre 05/28/2023   PFIZER(Purple Top)SARS-COV-2 Vaccination 10/13/2019, 11/03/2019     Objective: Vital Signs: BP 120/74 (BP Location: Left Arm, Patient Position: Sitting, Cuff Size: Normal)   Pulse 73   Resp 14   Ht 5' 3 (1.6 m)   Wt 159 lb (72.1 kg)   BMI 28.17 kg/m    Physical Exam Eyes:     Conjunctiva/sclera: Conjunctivae normal.  Cardiovascular:     Rate and Rhythm: Normal rate and regular rhythm.  Pulmonary:     Effort: Pulmonary effort is normal.     Breath sounds: Normal breath sounds.  Musculoskeletal:     Right lower leg: No edema.     Left lower leg: No edema.  Skin:    General: Skin is warm and dry.     Coloration: Skin is jaundiced.  Neurological:     Mental Status: She is alert.  Psychiatric:        Mood and Affect: Mood normal.      Musculoskeletal Exam:  Shoulders full ROM, right shoulder abduction strength slightly decreased compared to left, mild tenderness with resisted abduction and painful empty can test Elbows full ROM no tenderness or swelling Wrists full ROM no tenderness or swelling Fingers full ROM no tenderness  or swelling Knees full ROM no tenderness or swelling Ankles full ROM no tenderness or swelling  Investigation: No additional findings.  Imaging: No results found.  Recent Labs: Lab Results  Component Value Date   WBC 8.1 02/26/2024   HGB 12.9 02/26/2024   PLT 298 02/26/2024   NA 141 02/26/2024   K 5.0  02/26/2024   CL 105 02/26/2024   CO2 30 02/26/2024   GLUCOSE 76 02/26/2024   BUN 17 02/26/2024   CREATININE 0.85 02/26/2024   BILITOT 0.7 02/26/2024   ALKPHOS 88 06/27/2022   AST 29 02/26/2024   ALT 28 02/26/2024   PROT 7.7 02/26/2024   ALBUMIN 4.5 06/27/2022   CALCIUM 10.4 02/26/2024   GFRAA 77 12/07/2020   QFTBGOLDPLUS NEGATIVE 05/02/2023    Speciality Comments: No specialty comments available.  Procedures:  Large Joint Inj: R subacromial bursa on 02/26/2024 3:20 PM Indications: pain Details: 27 G 1.5 in needle, lateral approach Medications: 2 mL lidocaine  1 %; 40 mg triamcinolone  acetonide 40 MG/ML Outcome: tolerated well, no immediate complications Procedure, treatment alternatives, risks and benefits explained, specific risks discussed. Consent was given by the patient. Immediately prior to procedure a time out was called to verify the correct patient, procedure, equipment, support staff and site/side marked as required. Patient was prepped and draped in the usual sterile fashion.     Allergies: Penicillins   Assessment / Plan:     Visit Diagnoses: Bilateral scleritis - Plan: methotrexate  (RHEUMATREX) 2.5 MG tablet, Sedimentation rate No obvious recurrence of visual or ophthalmic symptoms.  No peripheral joint synovitis or dactylitis or skin rashes present on exam today. -Checking sed rate for disease activity monitoring -Continue [place]i 40 mg subcu q. 14 days -Continue methotrexate  10 mg p.o. weekly folic acid  2 mg daily   High risk medication use - Simlandi 40 mg subcu q. 14 days, methotrexate  10 mg p.o. weekly folic acid  2 mg daily - Plan: CBC with Differential/Platelet, Comprehensive metabolic panel with GFR Szymon tolerating medication well.  No serious interval infection. -Checking CBC and CMP for medication monitoring on Simlandi  Tendinopathy of right gluteus medius Symptoms much improved now  Prediabetes  Chronic right shoulder pain - Plan: Large Joint  Inj: R subacromial bursa Right shoulder osteoarthritis with chronic pain Chronic right shoulder pain likely due to osteoarthritis. Symptoms less typical for inflammatory arthritis and no peripheral involvement. Differential includes rotator cuff tendonitis or partial tear. Concern for gastrointestinal side effects from ibuprofen . - Administer steroid injection in right shoulder joint to address pain and inflammation. - Continue ibuprofen  as needed, monitor for gastrointestinal side effects. - Consider physical therapy if pain persists or worsens.        Orders: Orders Placed This Encounter  Procedures   Large Joint Inj: R subacromial bursa   Sedimentation rate   CBC with Differential/Platelet   Comprehensive metabolic panel with GFR   Meds ordered this encounter  Medications   methotrexate  (RHEUMATREX) 2.5 MG tablet    Sig: Take 4 tablets (10 mg total) by mouth once a week. Caution:Chemotherapy. Protect from light.    Dispense:  48 tablet    Refill:  0     Follow-Up Instructions: Return in about 3 months (around 05/28/2024) for Scleritis on ADA/inj f/u 3mos.   Lonni LELON Ester, MD  Note - This record has been created using [employer].  Chart creation errors have been sought, but may not always  have been located. Such creation errors do not reflect on  the  standard of medical care.

## 2024-02-25 DIAGNOSIS — Z1231 Encounter for screening mammogram for malignant neoplasm of breast: Secondary | ICD-10-CM | POA: Diagnosis not present

## 2024-02-25 DIAGNOSIS — Z13820 Encounter for screening for osteoporosis: Secondary | ICD-10-CM | POA: Diagnosis not present

## 2024-02-25 DIAGNOSIS — N898 Other specified noninflammatory disorders of vagina: Secondary | ICD-10-CM | POA: Diagnosis not present

## 2024-02-25 DIAGNOSIS — Z01411 Encounter for gynecological examination (general) (routine) with abnormal findings: Secondary | ICD-10-CM | POA: Diagnosis not present

## 2024-02-26 ENCOUNTER — Ambulatory Visit (INDEPENDENT_AMBULATORY_CARE_PROVIDER_SITE_OTHER): Admitting: Internal Medicine

## 2024-02-26 ENCOUNTER — Encounter: Payer: Self-pay | Admitting: Internal Medicine

## 2024-02-26 ENCOUNTER — Ambulatory Visit: Attending: Internal Medicine | Admitting: Internal Medicine

## 2024-02-26 VITALS — BP 120/74 | HR 73 | Resp 14 | Ht 63.0 in | Wt 159.0 lb

## 2024-02-26 DIAGNOSIS — M67951 Unspecified disorder of synovium and tendon, right thigh: Secondary | ICD-10-CM

## 2024-02-26 DIAGNOSIS — H15003 Unspecified scleritis, bilateral: Secondary | ICD-10-CM | POA: Diagnosis not present

## 2024-02-26 DIAGNOSIS — Z79899 Other long term (current) drug therapy: Secondary | ICD-10-CM

## 2024-02-26 DIAGNOSIS — R7303 Prediabetes: Secondary | ICD-10-CM

## 2024-02-26 DIAGNOSIS — G8929 Other chronic pain: Secondary | ICD-10-CM | POA: Diagnosis not present

## 2024-02-26 DIAGNOSIS — M25511 Pain in right shoulder: Secondary | ICD-10-CM | POA: Diagnosis not present

## 2024-02-26 DIAGNOSIS — H2013 Chronic iridocyclitis, bilateral: Secondary | ICD-10-CM

## 2024-02-26 LAB — COMPREHENSIVE METABOLIC PANEL WITH GFR
AG Ratio: 1.6 (calc) (ref 1.0–2.5)
ALT: 28 U/L (ref 6–29)
AST: 29 U/L (ref 10–35)
Albumin: 4.7 g/dL (ref 3.6–5.1)
Alkaline phosphatase (APISO): 56 U/L (ref 37–153)
BUN: 17 mg/dL (ref 7–25)
CO2: 30 mmol/L (ref 20–32)
Calcium: 10.4 mg/dL (ref 8.6–10.4)
Chloride: 105 mmol/L (ref 98–110)
Creat: 0.85 mg/dL (ref 0.50–1.05)
Globulin: 3 g/dL (ref 1.9–3.7)
Glucose, Bld: 76 mg/dL (ref 65–99)
Potassium: 5 mmol/L (ref 3.5–5.3)
Sodium: 141 mmol/L (ref 135–146)
Total Bilirubin: 0.7 mg/dL (ref 0.2–1.2)
Total Protein: 7.7 g/dL (ref 6.1–8.1)
eGFR: 76 mL/min/1.73m2 (ref >=60)

## 2024-02-26 LAB — SEDIMENTATION RATE: Sed Rate: 14 mm/h (ref 0–30)

## 2024-02-26 LAB — CBC WITH DIFFERENTIAL/PLATELET
Absolute Lymphocytes: 3127 {cells}/uL (ref 850–3900)
Absolute Monocytes: 721 {cells}/uL (ref 200–950)
Basophils Absolute: 49 {cells}/uL (ref 0–200)
Basophils Relative: 0.6 %
Eosinophils Absolute: 73 {cells}/uL (ref 15–500)
Eosinophils Relative: 0.9 %
HCT: 38.9 % (ref 35.0–45.0)
Hemoglobin: 12.9 g/dL (ref 11.7–15.5)
MCH: 33.9 pg — ABNORMAL HIGH (ref 27.0–33.0)
MCHC: 33.2 g/dL (ref 32.0–36.0)
MCV: 102.4 fL — ABNORMAL HIGH (ref 80.0–100.0)
MPV: 9.5 fL (ref 7.5–12.5)
Monocytes Relative: 8.9 %
Neutro Abs: 4131 {cells}/uL (ref 1500–7800)
Neutrophils Relative %: 51 %
Platelets: 298 Thousand/uL (ref 140–400)
RBC: 3.8 Million/uL (ref 3.80–5.10)
RDW: 12.7 % (ref 11.0–15.0)
Total Lymphocyte: 38.6 %
WBC: 8.1 Thousand/uL (ref 3.8–10.8)

## 2024-02-26 MED ORDER — METHOTREXATE SODIUM 2.5 MG PO TABS: 10.0000 mg | ORAL_TABLET | ORAL | 0 refills | Status: DC

## 2024-03-01 MED ORDER — LIDOCAINE HCL 1 % IJ SOLN
2.0000 mL | INTRAMUSCULAR | Status: AC | PRN
  Administered 2024-02-26: 2 mL

## 2024-03-01 MED ORDER — TRIAMCINOLONE ACETONIDE 40 MG/ML IJ SUSP
40.0000 mg | INTRAMUSCULAR | Status: AC | PRN
  Administered 2024-02-26: 40 mg via INTRA_ARTICULAR

## 2024-03-02 ENCOUNTER — Ambulatory Visit (INDEPENDENT_AMBULATORY_CARE_PROVIDER_SITE_OTHER): Admitting: Internal Medicine

## 2024-03-02 ENCOUNTER — Encounter (INDEPENDENT_AMBULATORY_CARE_PROVIDER_SITE_OTHER): Payer: Self-pay | Admitting: Internal Medicine

## 2024-03-02 VITALS — BP 129/77 | HR 75 | Temp 98.0°F | Ht 63.0 in | Wt 150.0 lb

## 2024-03-02 DIAGNOSIS — I10 Essential (primary) hypertension: Secondary | ICD-10-CM | POA: Diagnosis not present

## 2024-03-02 DIAGNOSIS — E669 Obesity, unspecified: Secondary | ICD-10-CM

## 2024-03-02 DIAGNOSIS — Z6830 Body mass index (BMI) 30.0-30.9, adult: Secondary | ICD-10-CM

## 2024-03-02 DIAGNOSIS — E7800 Pure hypercholesterolemia, unspecified: Secondary | ICD-10-CM

## 2024-03-02 DIAGNOSIS — Z6826 Body mass index (BMI) 26.0-26.9, adult: Secondary | ICD-10-CM

## 2024-03-02 DIAGNOSIS — Z9189 Other specified personal risk factors, not elsewhere classified: Secondary | ICD-10-CM

## 2024-03-02 DIAGNOSIS — R7303 Prediabetes: Secondary | ICD-10-CM

## 2024-03-02 MED ORDER — ZEPBOUND 5 MG/0.5ML ~~LOC~~ SOAJ: 5.0000 mg | SUBCUTANEOUS | 0 refills | Status: DC

## 2024-03-02 NOTE — Assessment & Plan Note (Addendum)
 She has a history of high blood pressure, hypercholesterolemia, obesity and rheumatological disease.  All of these conditions increase her risk of a cardiovascular event.  She also has family history of a stroke in her father at the age of 61.  Her 10-year cardiovascular risk was calculated at 11.5%.

## 2024-03-02 NOTE — Assessment & Plan Note (Signed)
 Her most recent A1c was 6.0 and improved from 6.4, she had some side effects from metformin so medication was discontinued.  She is also now on Zepbound for pharmacoprophylaxis and weight management with adequate clinical response and no side effects.  She will continue GLP-1 therapy.  Patient has also been counseled on maintaining a diet with a low glycemic load.

## 2024-03-02 NOTE — Assessment & Plan Note (Addendum)
 Obesity with associated hypertension, hypercholesterolemia, and prediabetes in remission Obesity management is ongoing with significant weight loss achieved. Current weight is 150 lbs with a body fat percentage of 29%. Muscle mass has been preserved. Prediabetes is in remission with A1c reduced from 6.4% to 5.5%. Hypertension is well-controlled, though occasional morning lightheadedness suggests possible overmedication. Hypercholesterolemia is present. Current medication, Set Bound 5 mg weekly, aids in weight loss and A1c control, reducing cardiovascular risk. - Continue Zepbound  5 mg weekly for 90 days - Increase daily caloric intake to 1400 calories for weight loss maintenance - Incorporate healthy fats such as nuts, avocado, and olive oil - Increase protein intake to 120 grams per day - Monitor blood pressure regularly, especially if experiencing lightheadedness - Consider reducing amlodipine  to 5 mg if lightheadedness persists

## 2024-03-02 NOTE — Assessment & Plan Note (Signed)
 Blood pressure well-controlled.  She is currently on amlodipine 10 mg a day and losartan 100 mg.  Continue current regimen.  Monitor for orthostasis while losing weight

## 2024-03-02 NOTE — Assessment & Plan Note (Addendum)
 I reviewed labs from March her LDL cholesterol still elevated which increases her cardiovascular risk to about 12%.  I have asked her to discuss this with her primary care physician as she may benefit from further risk stratification or initiation of statin therapy.

## 2024-03-02 NOTE — Progress Notes (Signed)
 Office: [phone]  /  Fax: [phone]  Weight Summary and Body Composition Analysis (BIA)  Vitals Temp: 98 F (36.7 C) BP: 129/77 Pulse Rate: 75 SpO2: 100 %   Anthropometric Measurements Height: 5' 3 (1.6 m) Weight: 150 lb (68 kg) BMI (Calculated): 26.58 Weight at Last Visit: 157 lb Weight Lost Since Last Visit: 7 lb Weight Gained Since Last Visit: 0 lb Starting Weight: 180 lb Total Weight Loss (lbs): 30 lb (13.6 kg) Peak Weight: 230 lb   Body Composition  Body Fat %: 29.2 % Fat Mass (lbs): 43.8 lbs Muscle Mass (lbs): 100.8 lbs Total Body Water (lbs): 68 lbs Visceral Fat Rating : 8    RMR: 1742  Today's Visit #: 19  No data recorded  Subjective   Chief Complaint: Obesity  Interval History Discussed the use of AI scribe software for clinical note transcription with the patient, who gave verbal consent to proceed.  History of Present Illness   Kari Williams is a 63 year old female with hypertension, hypercholesterolemia, and prediabetes who presents for medical weight management.  She has been actively participating in a weight management program and has successfully lost weight, currently weighing 150 pounds with a body fat percentage of 29%. She is on semaglutide 5 mg once a week as part of her treatment regimen.  Her associated medical conditions include hypertension, hypercholesterolemia, and prediabetes. Her A1c has improved from 6.0% to 5.5% as of March 2025. Her blood pressure is generally well-controlled, although she sometimes feels it is 'a little too up'.  She has incorporated dietary changes, such as adding Skyr yogurt and smoothies with spinach and fruit, which have contributed to her weight loss. She is mindful of her portion sizes and sometimes needs to remind herself to eat, particularly at lunch where she often has a smoothie.  She is concerned about maintaining her muscle mass while losing weight. Over the last four months, she  has preserved her muscle mass, although she has lost some muscle weight since starting the program.  Her family history includes her father who died of a stroke, and she is aware of her cardiovascular risk factors. She monitors her blood pressure regularly and has noticed occasional wooziness in the mornings, which she attributes to her blood pressure medication, amlodipine .  She is currently taking methotrexate  (four pills once a week) and Humira  injections for her rheumatologic condition. She recently received a cortisone shot for shoulder pain due to a previously reported rotator cuff tear.       Challenges affecting patient progress: medical comorbidities and presence of obesogenic drugs.    Pharmacotherapy for weight management: She is currently taking Zepbound  with adequate clinical response  and without side effects..   Assessment and Plan   Treatment Plan For Obesity:  Recommended Dietary Goals  Kari Williams is currently in the action stage of change. As such, her goal is to continue weight management plan. She has agreed to: continue current plan  Behavioral Health and Counseling  We discussed the following behavioral modification strategies today: continue to work on maintaining a reduced calorie state, getting the recommended amount of protein, incorporating whole foods, making healthy choices, staying well hydrated and practicing mindfulness when eating..  Additional education and resources provided today: None  Recommended Physical Activity Goals  Kari Williams has been advised to work up to 150 minutes of moderate intensity aerobic activity a week and strengthening exercises 2-3 times per week for cardiovascular health, weight loss maintenance and preservation of muscle mass.  She has agreed to :  Continue current level of physical activity   Medical Interventions and Pharmacotherapy  We discussed various medication options to help Kari Williams with her weight loss efforts and we  both agreed to : Adequate clinical response to anti-obesity medication, continue current regimen  Associated Conditions Impacted by Obesity Treatment  Assessment & Plan Primary hypertension  Blood pressure well-controlled.  She is currently on amlodipine  10 mg a day and losartan 100 mg.  Continue current regimen.  Monitor for orthostasis while losing weight Prediabetes Her most recent A1c was 6.0 and improved from 6.4, she had some side effects from metformin  so medication was discontinued.  She is also now on Zepbound  for pharmacoprophylaxis and weight management with adequate clinical response and no side effects.  She will continue GLP-1 therapy.  Patient has also been counseled on maintaining a diet with a low glycemic load. Generalized obesity Obesity with associated hypertension, hypercholesterolemia, and prediabetes in remission Obesity management is ongoing with significant weight loss achieved. Current weight is 150 lbs with a body fat percentage of 29%. Muscle mass has been preserved. Prediabetes is in remission with A1c reduced from 6.4% to 5.5%. Hypertension is well-controlled, though occasional morning lightheadedness suggests possible overmedication. Hypercholesterolemia is present. Current medication, Set Bound 5 mg weekly, aids in weight loss and A1c control, reducing cardiovascular risk. - Continue Zepbound  5 mg weekly for 90 days - Increase daily caloric intake to 1400 calories for weight loss maintenance - Incorporate healthy fats such as nuts, avocado, and olive oil - Increase protein intake to 120 grams per day - Monitor blood pressure regularly, especially if experiencing lightheadedness - Consider reducing amlodipine  to 5 mg if lightheadedness persists At increased risk for cardiovascular disease She has a history of high blood pressure, hypercholesterolemia, obesity and rheumatological disease.  All of these conditions increase her risk of a cardiovascular event.  She also  has family history of a stroke in her father at the age of 51.  Her 10-year cardiovascular risk was calculated at 11.5%.  Hypercholesteremia I reviewed labs from March her LDL cholesterol still elevated which increases her cardiovascular risk to about 12%.  I have asked her to discuss this with her primary care physician as she may benefit from further risk stratification or initiation of statin therapy. BMI 26.0-26.9,adult                Objective   Physical Exam:  Blood pressure 129/77, pulse 75, temperature 98 F (36.7 C), height 5' 3 (1.6 m), weight 150 lb (68 kg), SpO2 100%. Body mass index is 26.57 kg/m.  General: She is overweight, cooperative, alert, well developed, and in no acute distress. PSYCH: Has normal mood, affect and thought process.   HEENT: EOMI, sclerae are anicteric. Lungs: Normal breathing effort, no conversational dyspnea. Extremities: No edema.  Neurologic: No gross sensory or motor deficits. No tremors or fasciculations noted.    Diagnostic Data Reviewed:  BMET    Component Value Date/Time   NA 141 02/26/2024 1524   NA 142 06/27/2022 1007   K 5.0 02/26/2024 1524   CL 105 02/26/2024 1524   CO2 30 02/26/2024 1524   GLUCOSE 76 02/26/2024 1524   BUN 17 02/26/2024 1524   BUN 13 06/27/2022 1007   CREATININE 0.85 02/26/2024 1524   CALCIUM 10.4 02/26/2024 1524   GFRNONAA 66 12/07/2020 1518   GFRAA 77 12/07/2020 1518   Lab Results  Component Value Date   HGBA1C 6.0 (H) 05/02/2023  HGBA1C 6.4 (H) 06/27/2022   Lab Results  Component Value Date   INSULIN  23.1 06/27/2022   Lab Results  Component Value Date   TSH 1.070 06/27/2022   CBC    Component Value Date/Time   WBC 8.1 02/26/2024 1524   RBC 3.80 02/26/2024 1524   HGB 12.9 02/26/2024 1524   HCT 38.9 02/26/2024 1524   PLT 298 02/26/2024 1524   MCV 102.4 (H) 02/26/2024 1524   MCH 33.9 (H) 02/26/2024 1524   MCHC 33.2 02/26/2024 1524   RDW 12.7 02/26/2024 1524   Iron Studies No  results found for: IRON, TIBC, FERRITIN, IRONPCTSAT Lipid Panel     Component Value Date/Time   CHOL 234 (H) 06/27/2022 1007   TRIG 144 06/27/2022 1007   HDL 43 06/27/2022 1007   LDLCALC 165 (H) 06/27/2022 1007   Hepatic Function Panel     Component Value Date/Time   PROT 7.7 02/26/2024 1524   PROT 7.8 06/27/2022 1007   ALBUMIN 4.5 06/27/2022 1007   AST 29 02/26/2024 1524   ALT 28 02/26/2024 1524   ALKPHOS 88 06/27/2022 1007   BILITOT 0.7 02/26/2024 1524   BILITOT 0.5 06/27/2022 1007      Component Value Date/Time   TSH 1.070 06/27/2022 1007   Nutritional Lab Results  Component Value Date   VD25OH 51.7 10/18/2022   VD25OH 11.4 (L) 06/27/2022    Medications: Outpatient Encounter Medications as of 03/02/2024  Medication Sig   Adalimumab -ryvk, 2 Pen, 40 MG/0.4ML AJKT INJECT 40 MG UNDER THE SKIN EVERY 14 DAYS   amLODipine  (NORVASC ) 10 MG tablet Take 10 mg by mouth daily.   DULoxetine (CYMBALTA) 30 MG capsule Take 30 mg by mouth daily.   folic acid  (FOLVITE ) 1 MG tablet TAKE 2 TABLETS DAILY   losartan (COZAAR) 100 MG tablet Take 100 mg by mouth daily.   methotrexate  (RHEUMATREX) 2.5 MG tablet Take 4 tablets (10 mg total) by mouth once a week. Caution:Chemotherapy. Protect from light.   Multiple Vitamins-Minerals (MULTIVITAMIN WITH MINERALS) tablet Take 1 tablet by mouth daily.   VITAMIN D  PO Take by mouth.   ZEPBOUND  5 MG/0.5ML Pen INJECT 5 MG UNDER THE SKIN WEEKLY   ondansetron  (ZOFRAN -ODT) 4 MG disintegrating tablet Take 1 tablet (4 mg total) by mouth every 8 (eight) hours as needed for nausea or vomiting. (Patient not taking: Reported on 03/02/2024)   No facility-administered encounter medications on file as of 03/02/2024.     Follow-Up   No follow-ups on file.Kari Williams She was informed of the importance of frequent follow up visits to maximize her success with intensive lifestyle modifications for her multiple health conditions.  Attestation Statement   Reviewed by  clinician on day of visit: allergies, medications, problem list, medical history, surgical history, family history, social history, and previous encounter notes.     Lucas Parker, MD

## 2024-03-03 ENCOUNTER — Encounter (INDEPENDENT_AMBULATORY_CARE_PROVIDER_SITE_OTHER): Payer: Self-pay | Admitting: Thoracic Surgery (Cardiothoracic Vascular Surgery)

## 2024-03-03 ENCOUNTER — Telehealth (INDEPENDENT_AMBULATORY_CARE_PROVIDER_SITE_OTHER): Payer: Self-pay | Admitting: Internal Medicine

## 2024-03-03 ENCOUNTER — Other Ambulatory Visit (INDEPENDENT_AMBULATORY_CARE_PROVIDER_SITE_OTHER): Payer: Self-pay

## 2024-03-03 DIAGNOSIS — Z6830 Body mass index (BMI) 30.0-30.9, adult: Secondary | ICD-10-CM

## 2024-03-03 DIAGNOSIS — R7303 Prediabetes: Secondary | ICD-10-CM

## 2024-03-03 MED ORDER — ZEPBOUND 5 MG/0.5ML ~~LOC~~ SOAJ: 5.0000 mg | SUBCUTANEOUS | 0 refills | Status: DC

## 2024-03-03 NOTE — Telephone Encounter (Signed)
 Pt called in stating her prescription was sent to [facility] but should have been [facility] to [facility]. The prescription is for Zepbound  5 mg.

## 2024-03-03 NOTE — Telephone Encounter (Signed)
 error

## 2024-03-20 ENCOUNTER — Other Ambulatory Visit: Payer: Self-pay | Admitting: Internal Medicine

## 2024-03-20 DIAGNOSIS — M8589 Other specified disorders of bone density and structure, multiple sites: Secondary | ICD-10-CM | POA: Diagnosis not present

## 2024-03-20 DIAGNOSIS — Z13820 Encounter for screening for osteoporosis: Secondary | ICD-10-CM | POA: Diagnosis not present

## 2024-03-20 NOTE — Telephone Encounter (Signed)
 Last Fill: 01/07/2024  Labs: 02/26/2024 MCV 102.4, MCH 33.9  TB Gold: 05/02/2023 Neg   Next Visit: 06/01/2024  Last Visit: 02/26/2024  DX:Bilateral scleritis   Current Dose per office note 02/26/2024: Simlandi 40 mg subcu q. 14 days   Okay to refill Adalimumab -ryvk  ?

## 2024-04-02 DIAGNOSIS — Z780 Asymptomatic menopausal state: Secondary | ICD-10-CM | POA: Diagnosis not present

## 2024-04-02 DIAGNOSIS — Z806 Family history of leukemia: Secondary | ICD-10-CM | POA: Diagnosis not present

## 2024-04-02 DIAGNOSIS — M8580 Other specified disorders of bone density and structure, unspecified site: Secondary | ICD-10-CM | POA: Diagnosis not present

## 2024-04-02 DIAGNOSIS — E559 Vitamin D deficiency, unspecified: Secondary | ICD-10-CM | POA: Diagnosis not present

## 2024-04-02 DIAGNOSIS — Z803 Family history of malignant neoplasm of breast: Secondary | ICD-10-CM | POA: Diagnosis not present

## 2024-04-21 DIAGNOSIS — H15002 Unspecified scleritis, left eye: Secondary | ICD-10-CM | POA: Diagnosis not present

## 2024-04-21 DIAGNOSIS — H35033 Hypertensive retinopathy, bilateral: Secondary | ICD-10-CM | POA: Diagnosis not present

## 2024-04-21 DIAGNOSIS — H35371 Puckering of macula, right eye: Secondary | ICD-10-CM | POA: Diagnosis not present

## 2024-04-21 DIAGNOSIS — H2513 Age-related nuclear cataract, bilateral: Secondary | ICD-10-CM | POA: Diagnosis not present

## 2024-04-27 ENCOUNTER — Ambulatory Visit (INDEPENDENT_AMBULATORY_CARE_PROVIDER_SITE_OTHER): Admitting: Internal Medicine

## 2024-04-27 ENCOUNTER — Encounter (INDEPENDENT_AMBULATORY_CARE_PROVIDER_SITE_OTHER): Payer: Self-pay | Admitting: Internal Medicine

## 2024-04-27 VITALS — BP 128/78 | HR 71 | Temp 98.1°F | Ht 63.0 in | Wt 150.0 lb

## 2024-04-27 DIAGNOSIS — E669 Obesity, unspecified: Secondary | ICD-10-CM

## 2024-04-27 DIAGNOSIS — H209 Unspecified iridocyclitis: Secondary | ICD-10-CM

## 2024-04-27 DIAGNOSIS — I10 Essential (primary) hypertension: Secondary | ICD-10-CM

## 2024-04-27 DIAGNOSIS — Z6826 Body mass index (BMI) 26.0-26.9, adult: Secondary | ICD-10-CM

## 2024-04-27 DIAGNOSIS — R7303 Prediabetes: Secondary | ICD-10-CM | POA: Diagnosis not present

## 2024-04-27 NOTE — Assessment & Plan Note (Signed)
 Weight: decrease of 44.2 lb (22.8%) over 3 years, 9 months  Start: 07/12/2020 194 lb 3.2 oz (88.1 kg)  End: 04/27/2024 150 lb (68 kg)   Obesity with associated hypertension, hypercholesterolemia, and prediabetes  Obesity management is ongoing with significant weight loss achieved. Current weight is 150 lbs with a body fat percentage of 29%. Muscle mass has been preserved. Prediabetes is in remission with A1c reduced from 6.4% to 5.5%. Hypertension is well-controlled, though occasional morning lightheadedness suggests possible overmedication. Hypercholesterolemia is present. Current medication, Set Bound 5 mg weekly, aids in weight loss and A1c control, reducing cardiovascular risk. - Continue Zepbound  5 mg weekly for 90 days - Increase daily caloric intake to 1400 calories for weight loss maintenance - Incorporate healthy fats such as nuts, avocado, and olive oil - Increase protein intake to 120 grams per day - Monitor blood pressure regularly, especially if experiencing lightheadedness - Consider reducing amlodipine  to 5 mg if lightheadedness persists

## 2024-04-27 NOTE — Assessment & Plan Note (Signed)
 Blood pressure well-controlled.  She is currently on amlodipine 10 mg a day and losartan 100 mg.  Continue current regimen.  Monitor for orthostasis while losing weight

## 2024-04-27 NOTE — Assessment & Plan Note (Signed)
 Her most recent A1c was 6.0 and improved from 6.4, she had some side effects from metformin so medication was discontinued.  She is also now on Zepbound for pharmacoprophylaxis and weight management with adequate clinical response and no side effects.  She will continue GLP-1 therapy.  Patient has also been counseled on maintaining a diet with a low glycemic load.

## 2024-04-27 NOTE — Progress Notes (Unsigned)
 Office: [phone]  /  Fax: [phone]  Weight Summary and Body Composition Analysis (BIA)  Vitals Temp: 98.1 F (36.7 C) BP: 128/78 Pulse Rate: 71 SpO2: 99 %   Anthropometric Measurements Height: 5' 3 (1.6 m) Weight: 150 lb (68 kg) BMI (Calculated): 26.58 Weight at Last Visit: 150 lb Weight Lost Since Last Visit: 0 lb Weight Gained Since Last Visit: 0 lb Starting Weight: 180 lb Total Weight Loss (lbs): 30 lb (13.6 kg) Peak Weight: 230 lb   Body Composition  Body Fat %: 31.5 % Fat Mass (lbs): 47.4 lbs Muscle Mass (lbs): 98 lbs Total Body Water (lbs): 67.4 lbs Visceral Fat Rating : 8    RMR: 1742  Today's Visit #: 20  Starting Date: 06/27/22   Subjective   Chief Complaint: Obesity  Interval History Discussed the use of AI scribe software for clinical note transcription with the patient, who gave verbal consent to proceed.  History of Present Illness Kari Williams is a 63 year old female who presents for medical weight management.  Associated conditions include hypertension prediabetes hypercholesterolemia  Kari Williams has been effectively managing her weight through consistent exercise and dietary modifications. Her exercise routine includes weight training twice a week and walking with her dog. Kari Williams uses a GLP-1 receptor agonist to control hunger and maintain weight. Previously, Kari Williams weighed 225-230 pounds and was on a diuretic. Currently, Kari Williams takes Zepbound  and incorporates smoothies into her diet, using prepackaged frozen fruit, half a banana, and [place] yogurt.  Kari Williams has a history of hypertension, currently managed with amlodipine  and losartan, resulting in well-controlled blood pressure at 128/78 mmHg. Kari Williams experiences occasional dizziness upon standing, which Kari Williams manages by sitting on the edge of the bed before standing. Her family history includes her father dying of a stroke when Kari Williams was 63 years old, and all her siblings are on high blood pressure  medication.  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 Her methotrexate  dosage was recently increased to six pills. Kari Williams continues to take folic acid  and Humira . Kari Williams experiences sensitivity to sunlight due to uveitis.  Kari Williams has a history of osteopenia and takes vitamin D  supplements. Kari Williams recently had a bone density test and is concerned about bone health. Kari Williams engages in weight-bearing exercises.     Challenges affecting patient progress: none.    Pharmacotherapy for weight management: Kari Williams is currently taking Zepbound  with adequate clinical response  and without side effects..   Assessment and Plan   Treatment Plan For Obesity:  Recommended Dietary Goals  Kari Williams is currently in the action stage of change. As such, her goal is to continue weight management plan. Kari Williams has agreed to: continue current plan  Behavioral Health and Counseling  We discussed the following behavioral modification strategies today: continue to work on maintaining a reduced calorie state, getting the recommended amount of protein, incorporating whole foods, making healthy choices, staying well hydrated and practicing mindfulness when eating. and increase protein intake, fibrous foods (25 grams per day for women, 30 grams for men) and water to improve satiety and decrease hunger signals. .  Additional education and resources provided today: None  Recommended Physical Activity Goals  Kari Williams has been advised to work up to 150 minutes of moderate intensity aerobic activity a week and strengthening exercises 2-3 times per week for cardiovascular health, weight loss maintenance and preservation of muscle mass.  Kari Williams has agreed to :  Continue current level of physical activity  Medical Interventions and Pharmacotherapy  We discussed various medication options to help Kari Williams with her weight loss efforts and we both agreed to : Adequate clinical response to anti-obesity  medication, continue current regimen  Associated Conditions Impacted by Obesity Treatment  Assessment & Plan Primary hypertension  Blood pressure well-controlled.  Kari Williams is currently on amlodipine  10 mg a day and losartan 100 mg.  Continue current regimen.  Monitor for orthostasis while losing weight Prediabetes Her most recent A1c was 6.0 and improved from 6.4, Kari Williams had some side effects from metformin  so medication was discontinued.  Kari Williams is also now on Zepbound  for pharmacoprophylaxis and weight management with adequate clinical response and no side effects.  Kari Williams will continue GLP-1 therapy.  Patient has also been counseled on maintaining a diet with a low glycemic load. Generalized obesity with starting BMI 32 Weight: decrease of 44.2 lb (22.8%) over 3 years, 9 months  Start: 07/12/2020 194 lb 3.2 oz (88.1 kg)  End: 04/27/2024 150 lb (68 kg)   Obesity with associated hypertension, hypercholesterolemia, and prediabetes  Obesity management is ongoing with significant weight loss achieved. Current weight is 150 lbs with a body fat percentage of 29%. Muscle mass has been preserved. Prediabetes is in remission with A1c reduced from 6.4% to 5.5%. Hypertension is well-controlled, though occasional morning lightheadedness suggests possible overmedication. Hypercholesterolemia is present. Current medication, Set Bound 5 mg weekly, aids in weight loss and A1c control, reducing cardiovascular risk. - Continue Zepbound  5 mg weekly for 90 days - Increase daily caloric intake to 1400 calories for weight loss maintenance - Incorporate healthy fats such as nuts, avocado, and olive oil - Increase protein intake to 120 grams per day - Monitor blood pressure regularly, especially if experiencing lightheadedness - Consider reducing amlodipine  to 5 mg if lightheadedness persists BMI 26.0-26.9,adult  Uveitis Experiencing a bout of uveitis, with symptoms of eye redness and sensitivity to sunlight. Methotrexate   dosage increased to 6 tablets weekly to manage the condition, and Kari Williams is using steroid eye drops three times a day. - Continue methotrexate  at 6 tablets weekly - Use steroid eye drops three times daily - Continue folic acid  supplementation    Assessment and Plan      Objective   Physical Exam:  Blood pressure 128/78, pulse 71, temperature 98.1 F (36.7 C), height 5' 3 (1.6 m), weight 150 lb (68 kg), SpO2 99%. Body mass index is 26.57 kg/m.  General: Kari Williams is overweight, cooperative, alert, well developed, and in no acute distress. PSYCH: Has normal mood, affect and thought process.   HEENT: EOMI, sclerae are anicteric. Lungs: Normal breathing effort, no conversational dyspnea. Extremities: No edema.  Neurologic: No gross sensory or motor deficits. No tremors or fasciculations noted.    Diagnostic Data Reviewed:  BMET    Component Value Date/Time   NA 141 02/26/2024 1524   NA 142 06/27/2022 1007   K 5.0 02/26/2024 1524   CL 105 02/26/2024 1524   CO2 30 02/26/2024 1524   GLUCOSE 76 02/26/2024 1524   BUN 17 02/26/2024 1524   BUN 13 06/27/2022 1007   CREATININE 0.85 02/26/2024 1524   CALCIUM 10.4 02/26/2024 1524   GFRNONAA 66 12/07/2020 1518   GFRAA 77 12/07/2020 1518   Lab Results  Component Value Date   HGBA1C 6.0 (H) 05/02/2023   HGBA1C 6.4 (H) 06/27/2022   Lab Results  Component Value Date   INSULIN  23.1 06/27/2022   Lab Results  Component Value Date   TSH 1.070 06/27/2022  CBC    Component Value Date/Time   WBC 8.1 02/26/2024 1524   RBC 3.80 02/26/2024 1524   HGB 12.9 02/26/2024 1524   HCT 38.9 02/26/2024 1524   PLT 298 02/26/2024 1524   MCV 102.4 (H) 02/26/2024 1524   MCH 33.9 (H) 02/26/2024 1524   MCHC 33.2 02/26/2024 1524   RDW 12.7 02/26/2024 1524   Iron Studies No results found for: IRON, TIBC, FERRITIN, IRONPCTSAT Lipid Panel     Component Value Date/Time   CHOL 234 (H) 06/27/2022 1007   TRIG 144 06/27/2022 1007   HDL 43  06/27/2022 1007   LDLCALC 165 (H) 06/27/2022 1007   Hepatic Function Panel     Component Value Date/Time   PROT 7.7 02/26/2024 1524   PROT 7.8 06/27/2022 1007   ALBUMIN 4.5 06/27/2022 1007   AST 29 02/26/2024 1524   ALT 28 02/26/2024 1524   ALKPHOS 88 06/27/2022 1007   BILITOT 0.7 02/26/2024 1524   BILITOT 0.5 06/27/2022 1007      Component Value Date/Time   TSH 1.070 06/27/2022 1007   Nutritional Lab Results  Component Value Date   VD25OH 51.7 10/18/2022   VD25OH 11.4 (L) 06/27/2022    Medications: Outpatient Encounter Medications as of 04/27/2024  Medication Sig   Adalimumab -ryvk, 2 Pen, 40 MG/0.4ML AJKT INJECT 40 MG UNDER THE SKIN EVERY 14 DAYS   amLODipine  (NORVASC ) 10 MG tablet Take 10 mg by mouth daily.   DULoxetine (CYMBALTA) 30 MG capsule Take 30 mg by mouth daily.   folic acid  (FOLVITE ) 1 MG tablet TAKE 2 TABLETS DAILY   losartan (COZAAR) 100 MG tablet Take 100 mg by mouth daily.   methotrexate  (RHEUMATREX) 2.5 MG tablet Take 4 tablets (10 mg total) by mouth once a week. Caution:Chemotherapy. Protect from light.   Multiple Vitamins-Minerals (MULTIVITAMIN WITH MINERALS) tablet Take 1 tablet by mouth daily.   tirzepatide  (ZEPBOUND ) 5 MG/0.5ML Pen Inject 5 mg into the skin once a week.   VITAMIN D  PO Take by mouth.   ondansetron  (ZOFRAN -ODT) 4 MG disintegrating tablet Take 1 tablet (4 mg total) by mouth every 8 (eight) hours as needed for nausea or vomiting. (Patient not taking: Reported on 04/27/2024)   No facility-administered encounter medications on file as of 04/27/2024.     Follow-Up   No follow-ups on file.SABRA Kari Williams was informed of the importance of frequent follow up visits to maximize her success with intensive lifestyle modifications for her multiple health conditions.  Attestation Statement   Reviewed by clinician on day of visit: allergies, medications, problem list, medical history, surgical history, family history, social history, and previous encounter  notes.     Lucas Parker, MD

## 2024-04-28 DIAGNOSIS — H209 Unspecified iridocyclitis: Secondary | ICD-10-CM | POA: Insufficient documentation

## 2024-04-28 NOTE — Assessment & Plan Note (Signed)
 Experiencing a bout of uveitis, with symptoms of eye redness and sensitivity to sunlight. Methotrexate  dosage increased to 6 tablets weekly to manage the condition, and she is using steroid eye drops three times a day. - Continue methotrexate  at 6 tablets weekly - Use steroid eye drops three times daily - Continue folic acid  supplementation

## 2024-05-04 ENCOUNTER — Other Ambulatory Visit (HOSPITAL_COMMUNITY): Payer: Self-pay

## 2024-05-04 ENCOUNTER — Telehealth: Payer: Self-pay

## 2024-05-04 NOTE — Telephone Encounter (Signed)
 Received fax from [employer] that pt's PA for Humira  will be expiring on 05/16/24.  Per refill history pt was switched to [facility] in January of 2025, however it does not appear that a PA was required. Attempting to run a test claim does not reveal the expiration date of the current authorization (if there is one). It does not appear that anything further is required at this time.

## 2024-05-19 NOTE — Progress Notes (Signed)
 Office Visit Note  Patient: Kari Williams             Date of Birth: [birth year]           MRN: [id number]             PCP: Rexanne Ingle, MD Referring: Rexanne Ingle, MD Visit Date: 06/01/2024   Subjective:  Medication Management (Had a flare of Uveitis. Patient states Dr. Maree increased her MTX to 6 tabs weekly. )   Discussed the use of AI scribe software for clinical note transcription with the patient, who gave verbal consent to proceed.  History of Present Illness   Kari Williams is a 63 y.o. female here for follow up for uveitis and scleritis on adalimumab  40 mg subcu q. 14 days and methotrexate  15 mg p.o. weekly and folic acid  2 mg daily.  She presents with recurrent eye redness and pain.  Her symptoms began on September 1st, coinciding with her husband's birthday, when she experienced difficulty seeing and eye pain. Initially, the symptoms subsided slightly but worsened around September 21st, leading to significant redness and pain that prevented her from going outside. By September 23rd, she sought medical attention due to the severity of her symptoms.  When her eyes become inflamed and red, she experiences blurriness upon waking, which typically resolves by noon. Recently, she experienced another episode of redness and sharp pain over the past weekend, but without the associated blurriness. Dr. Maree recommended increase of methotrexate  back to 15 mg weekly dose due to disease relapse.  She is currently using eye drops four times a day and her methotrexate  dosage was increased from four to six pills per week on September 23rd. She continues her Simlandi injections without interruption, taking her last dose this past Thursday, with the next dose due in a week and a half.   Previous HPI 02/26/2024 Kari Williams is a 63 y.o. female here for follow up for uveitis and scleritis on adalimumab  40 mg subcu q. 14 days and methotrexate  10 mg p.o. weekly and folic acid  2  mg daily.  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   She has been experiencing persistent shoulder pain for about a week, described as a constant, nagging sensation across the collarbone and into the neck area. Initially managed with 400 mg of ibuprofen  twice daily, she increased the dose to 800 mg due to persistent discomfort. She is concerned about potential gastrointestinal side effects of ibuprofen .   Five years ago, she was diagnosed with a 90% rotator cuff tear confirmed by imaging. She opted for physical therapy and exercises instead of surgery, which helped regain some mobility and reduce pain. Symptoms had been going well until this year with persistent pain and stifness now. She has done several exercises for her shoulder without much relief for the past 3 months.   She has not received any injections in the shoulder but has had a neck injection for unrelated neck issues. She uses a heating pad and ice pad to manage the pain and performs shoulder exercises to maintain mobility.   No vision changes, eye pain, or redness. She uses a heated eye mask at night to aid sleep.         Previous HPI 11/22/2023 Kari Williams is a 63 y.o. female here for follow up for uveitis and scleritis on adalimumab  40 mg subcu q. 14 days and methotrexate  10 mg p.o.  weekly and folic acid  2 mg daily.   She has been managing her prediabetes with Zepbound  for over a year, resulting in an improvement in her A1c levels from 6 to 5. She has also lost 14 pounds since her last visit, attributing this to dietary changes and medication, which have improved her satiety recognition and aided in weight management.   She is currently taking methotrexate , with a dosage of four tablets on Thursdays, for her rheumatoid arthritis. She is dissatisfied with the biosimilar medication's delivery system, which is dictated by her insurance. Her eyes have been less dry since  incorporating heated eye pads into her nightly routine for relaxation.   Approximately three weeks ago, she had the flu, confirmed by a test at a walk-in clinic. Her symptoms included headache, cough, and a scratchy throat. She was prescribed Tamiflu and a nasal spray, which she used three times a day. She still has a lingering cough and has been using glycerin pills for relief.   She has a history of shoulder issues, specifically with her rotator cuff, causing some nagging discomfort but not significant pain. Her range of motion is good, and she can perform daily activities without much limitation. Surgery has been advised against due to the manageable nature of her symptoms.         Previous HPI 05/02/23  Kari Williams is a 63 y.o. 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 14 days and methotrexate  12.5 mg p.o. weekly and folic acid  2 mg daily.  No new changes with vision or episodes of eye redness or pain.  No new joint pain complaints she has partial improvement with the previous right hip and gluteal area tenderness and stiffness she has been working on this with exercise.  Is also working with healthy weight and wellness clinic has not had much weight change.  She discussed starting Zepbound  with her PCP she is currently on metformin  twice daily.  Has a new problem especially for the past few weeks now with dizziness first thing in the mornings gets better when she is up and moving for a while and after breakfast.   Previous HPI 01/24/2023 Kari Williams is a 62 y.o. 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 14 days and methotrexate  12.5 mg p.o. weekly and folic acid  2 mg daily.  Most recent liver enzyme tests from last visit were elevated but mildly so I will trend for monitoring methotrexate .  No new eye pain or visual change complaints.  Has been having intermittent pain at the right hip comes and goes.  Often gets  worse moving after sitting in a prolonged fixed position or direct pressure such as lying on the right side.  Also having pain in the neck had an MRI for this that demonstrated a bulging disc.  She is scheduled for steroid injection next Friday for this.   Previous HPI 10/11/22 KALE DOLS is a 63 y.o. female here for follow up follow-up for uveitis and scleritis on Humira  40 mg subcu q. 14 days methotrexate  12.5 mg p.o. weekly and folic acid  2 mg daily.  Overall she is doing pretty well since last visit still gets some knee pain without a lot of swelling.  Eye dryness and mild irritation no serious pain inflammation or visual acuity changes.  Most recent ophthalmology exam looked good.  She had recent labs obtained earlier this month with a  normal complete blood count metabolic panel showed mild AST elevation to 57.     Previous HPI 06/19/22 GIAVONNA PFLUM is a 63 y.o. female here for follow up for uveitis and scleritis on Humira  40 mg [place] q14days and MTX 12.5 mg PO weekly and folic acid  2 mg daily. After last visit right knee pain improved and doing well now. She had follow up with Dr. Maree eye exam negative for active uveitis although had some dryness and surface irritation. Getting restasis approved for this. She also has migraines which were a problem I the past but had been free of these for years until recently.    Previous HPI 03/15/22 PEARLY BARTOSIK is a 63 y.o. 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 She had a UTI in June treated with nitrofurantoin then switched to cefuroxime based on susceptibilities and symptoms resolved.  Overall she is doing well no symptomatic complaints with eye irritation or vision changes.  She has some right knee pain and stiffness does not severely limit her mobility.  Symptoms are most noticeable with certain movements such as descending stairs.     Previous HPI 12/13/2021 IRINA OKELLY is a 63 y.o. female here for follow up  for uveiitis and scleritis on methotrexate  12.5 mg PO weekly and Humira  [place] q14days. Since our last visit her finger pain is improved not needing any additional NSAIDs for symptom management. She has floaters but no new visual complaint no eye redness or pain.   Previous HPI 09/13/2021 NIKELLE MALATESTA is a 63 y.o. female here for follow up for uveiitis and scleritis on methotrexate  15 mg PO weekly and Humira  [place] q14days. Last visit with Dr. Maree 08/01/21. Eyes are doing okay without new complaints and exam was okay. She has noticed some increased pain and swelling affecting left hand fingers also mildly tender nodule swelling behind her left ear.   Previous HPI 06/14/21 KAYDRA BORGEN is a 63 y.o. female here for follow up for uveitis and scleritis on methotrexate  15 mg PO weekly and Humira  40 mg [place] q14days. She has experienced small painless skin rashes in a few places including the left elbow these improved with conservative treatment moisturizing. She has left calf cramps at night ongoing no problems during the day or with mobility. Bilateral eye dryness is slightly worse than before during day time, using lubricating eye drops more than before. She has future appointment with Dr. Maree scheduled for next week.   Previous HPI 06/07/20 JASIEL APACHITO is a 63 y.o. 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 She has a previous history of treatment with methotrexate  for iritis in 2010-2015 with Dr. Cleatus and Dr. Everlean. She had been off any treatment but visit with Dr. 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  Since earlier this year she has experienced eye redness watering and blurry vision.  She is more symptomatic complaint on the right than on the left currently but both sides bother her.  Besides the eye complaints she does also report some fairly diffuse arthralgias and morning stiffness up to 30 minutes.  She  has not noticed any specific joint swelling warmth or erythema.  In the past she has also been treated with oral or ocular steroids but has not received any for the current relapse of symptoms.   Review of Systems  Constitutional:  Negative for fatigue.  HENT:  Negative for mouth sores and  mouth dryness.   Eyes:  Positive for dryness.  Respiratory:  Negative for shortness of breath.   Cardiovascular:  Negative for chest pain and palpitations.  Gastrointestinal:  Negative for blood in stool, constipation and diarrhea.  Endocrine: Negative for increased urination.  Genitourinary:  Negative for involuntary urination.  Musculoskeletal:  Positive for morning stiffness. Negative for joint pain, gait problem, joint pain, joint swelling, myalgias, muscle weakness, muscle tenderness and myalgias.  Skin:  Negative for color change, rash, hair loss and sensitivity to sunlight.  Allergic/Immunologic: Negative for susceptible to infections.  Neurological:  Negative for dizziness and headaches.  Hematological:  Negative for swollen glands.  Psychiatric/Behavioral:  Positive for depressed mood. Negative for sleep disturbance. The patient is nervous/anxious.     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   High risk medication use 06/07/2020   Carpal tunnel syndrome 11/29/2015   Hypertensive retinopathy of both eyes 11/01/2015   Bilateral chronic  anterior uveitis 10/13/2015   Nuclear sclerotic cataract of both eyes 10/13/2015   Epiretinal membrane (ERM) of right eye 10/13/2015   Lumbosacral radiculitis 09/01/2014   Encounter for routine gynecological examination 11/19/2011   Generalized abdominal or pelvic swelling or mass or lump 11/19/2011   BMI 26.0-26.9,adult 11/19/2011   Fibroids 11/19/2011   HTN (hypertension) 11/19/2011   Condyloma 11/19/2011    Past Medical History:  Diagnosis Date   Allergy    seasonal   Anxiety    Arthritis    osteoarthritis/ddd   Bulging of cervical intervertebral disc    Chest pain    Depression    Female infertility    Hypertension    Rheumatoid arthritis (HCC)    Uveitis     Family History  Problem Relation Age of Onset   Dementia Mother    Hypotension Mother    Stroke Father    Hypertension Father    Sudden death Father    Breast cancer Sister    Hypertension Sister    Thyroid  disease Sister    Colon cancer Brother    Sleep apnea Brother    Stroke Brother    Lupus Paternal Aunt    Lupus Niece    Past Surgical History:  Procedure Laterality Date   ABDOMINAL HYSTERECTOMY     CARPAL TUNNEL RELEASE  07/30/2000   right   COLONOSCOPY     LUMBAR DISC SURGERY  07/30/2004   LUMBAR FUSION  07/30/2009   THORACIC DISC SURGERY  07/30/2000   TUBAL LIGATION  07/02/2012   Procedure: BILATERAL TUBAL LIGATION;  Surgeon: Rome Rigg, MD;  Location: [facility];  Service: Gynecology;  Laterality: Bilateral;   UPPER GASTROINTESTINAL ENDOSCOPY     VAGINAL HYSTERECTOMY  07/02/2012   Procedure: HYSTERECTOMY VAGINAL;  Surgeon: Rome Rigg, MD;  Location: [facility];  Service: Gynecology;  Laterality: N/A;   Social History   Social History Narrative   Not on file   Immunization History  Administered Date(s)  Administered   Influenza Split 07/03/2012   Influenza, Seasonal, Injecte, Preservative Fre 05/28/2023   PFIZER(Purple Top)SARS-COV-2 Vaccination 10/13/2019, 11/03/2019      Objective: Vital Signs: BP 124/77 (BP Location: Left Arm, Patient Position: Sitting, Cuff Size: Small)   Pulse 76   Temp (!) 97.3 F (36.3 C)   Resp 12   Ht 5' 3 (1.6 m)   Wt 158 lb 9.6 oz (71.9 kg)   BMI 28.09 kg/m    Physical Exam Eyes:     Conjunctiva/sclera: Conjunctivae normal.  Cardiovascular:     Rate and Rhythm: Normal rate and regular rhythm.  Pulmonary:     Effort: Pulmonary effort is normal.     Breath sounds: Normal breath sounds.  Lymphadenopathy:     Cervical: No cervical adenopathy.  Skin:    General: Skin is warm and dry.  Neurological:     Mental Status: She is alert.  Psychiatric:        Mood and Affect: Mood normal.     Musculoskeletal Exam:  Shoulders full ROM, right shoulder mild pain with overhead abduction Elbows full ROM no tenderness or swelling Wrists full ROM no tenderness or swelling Fingers full ROM no tenderness or swelling Knees full ROM no tenderness or swelling Ankles full ROM no tenderness or swelling   Investigation: No additional findings.  Imaging: No results found.  Recent Labs: Lab Results  Component Value Date   WBC 8.1 02/26/2024   HGB 12.9 02/26/2024   PLT 298 02/26/2024   NA 141 02/26/2024   K 5.0 02/26/2024   CL 105 02/26/2024   CO2 30 02/26/2024   GLUCOSE 76 02/26/2024   BUN 17 02/26/2024   CREATININE 0.85 02/26/2024   BILITOT 0.7 02/26/2024   ALKPHOS 88 06/27/2022   AST 29 02/26/2024   ALT 28 02/26/2024   PROT 7.7 02/26/2024   ALBUMIN 4.5 06/27/2022   CALCIUM 10.4 02/26/2024   GFRAA 77 12/07/2020   QFTBGOLDPLUS NEGATIVE 05/02/2023    Speciality Comments: No specialty comments available.  Procedures:  No procedures performed Allergies: Penicillins   Assessment / Plan:     Visit Diagnoses: Bilateral scleritis - Plan: Adalimumab  Drug Level and Anti-drug Antibody for Rheumatic Diseases, Sedimentation rate Chronic bilateral scleritis with recent flare-ups despite methotrexate  and Simlandi.  Concerns about Simlandi efficacy due to possible drug resistance or rapid clearance. Will check for antidrug antibody development or low drug trough level next week, consider switch or increase to weekly. - Order blood test to check Simlandi drug levels and antibodies within 24 hours before next dose. - Consider increasing Simlandi dosing to weekly if drug levels are low or antibodies present. - Continue methotrexate  15 mg p.o. weekly folic acid  2 mg daily  - Consider switching medication if drug levels are adequate but flare-ups persist.   High risk medication use - Simlandi 40 mg subcu q. 14 days, methotrexate  10 mg p.o. weekly folic acid  2 mg daily - Plan: Comprehensive metabolic panel with GFR, CBC with Differential/Platelet  tolerating medication well.  No serious interval infection. -Checking CBC and CMP for medication monitoring on Simlandi  Chronic right shoulder pain - S/P- Right subacromial bursa 02/26/2024    Orders: Orders Placed This Encounter  Procedures   Adalimumab  Drug Level and Anti-drug Antibody for Rheumatic Diseases   Sedimentation rate   Comprehensive metabolic panel with GFR   CBC with Differential/Platelet   No orders of the defined types were placed in this encounter.    Follow-Up Instructions: Return in  about 3 months (around 09/01/2024) for Uveiitis on ADA/MTX f/u 3mos.   Lonni LELON Ester, MD  Note - This record has been created using [employer].  Chart creation errors have been sought, but may not always  have been located. Such creation errors do not reflect on  the standard of medical care.

## 2024-05-21 ENCOUNTER — Other Ambulatory Visit: Payer: Self-pay | Admitting: Internal Medicine

## 2024-05-21 DIAGNOSIS — H15003 Unspecified scleritis, bilateral: Secondary | ICD-10-CM

## 2024-05-21 NOTE — Telephone Encounter (Signed)
 Last Fill: 02/26/2024  Labs: 02/26/2024 MCV 102.4 MCH 33.9 Rest of CMP and CBC WNL  Next Visit: 06/01/2024  Last Visit: 02/26/2024  DX: Bilateral scleritis   Current Dose per office note 02/26/2024: methotrexate  10 mg p.o. weekly   Okay to refill Methotrexate ?

## 2024-06-01 ENCOUNTER — Ambulatory Visit: Attending: Internal Medicine | Admitting: Internal Medicine

## 2024-06-01 ENCOUNTER — Encounter: Payer: Self-pay | Admitting: Internal Medicine

## 2024-06-01 VITALS — BP 124/77 | HR 76 | Temp 97.3°F | Resp 12 | Ht 63.0 in | Wt 158.6 lb

## 2024-06-01 DIAGNOSIS — Z79899 Other long term (current) drug therapy: Secondary | ICD-10-CM

## 2024-06-01 DIAGNOSIS — H15003 Unspecified scleritis, bilateral: Secondary | ICD-10-CM | POA: Diagnosis not present

## 2024-06-01 DIAGNOSIS — M25511 Pain in right shoulder: Secondary | ICD-10-CM

## 2024-06-01 DIAGNOSIS — G8929 Other chronic pain: Secondary | ICD-10-CM

## 2024-06-01 DIAGNOSIS — M67951 Unspecified disorder of synovium and tendon, right thigh: Secondary | ICD-10-CM

## 2024-06-01 DIAGNOSIS — R7303 Prediabetes: Secondary | ICD-10-CM | POA: Diagnosis not present

## 2024-06-05 ENCOUNTER — Telehealth: Payer: Self-pay | Admitting: Pharmacist

## 2024-06-05 NOTE — Telephone Encounter (Signed)
 Submitted a Prior Authorization RENEWAL request to [employer] for [facility] (ADALIMUMAB -RYVK) via CoverMyMeds. Will update once we receive a response.  Key: [id number]

## 2024-06-08 ENCOUNTER — Other Ambulatory Visit: Payer: Self-pay | Admitting: Internal Medicine

## 2024-06-08 DIAGNOSIS — Z9225 Personal history of immunosupression therapy: Secondary | ICD-10-CM

## 2024-06-08 DIAGNOSIS — Z111 Encounter for screening for respiratory tuberculosis: Secondary | ICD-10-CM

## 2024-06-08 NOTE — Telephone Encounter (Signed)
 Received notification from [employer] regarding a prior authorization for [facility] (ADALIMUMAB -RYVK). Authorization has been APPROVED from 05/06/2024 to 06/05/2025.   Authorization # [id number]  Sherry Pennant, PharmD, MPH, BCPS, CPP Clinical Pharmacist [facility])

## 2024-06-08 NOTE — Telephone Encounter (Signed)
 Last Fill: 03/20/2024  Labs: 02/26/2024 MCV 102.4 MCH 33.9 Rest of CBC and CMP WNL  TB Gold: 05/02/2023 TB Gold Negative   Next Visit: 09/01/2024  Last Visit: 06/01/2024  DX: Bilateral scleritis   Current Dose per office note 06/01/2024: Simlandi 40 mg subcu q. 14 days   Okay to refill Simlandi (Adalimumab -RYVK)?  Contacted patient to advise that labs are due to be updated, patient said she will be in  Wednesday or Thursday to do so.

## 2024-06-11 ENCOUNTER — Other Ambulatory Visit: Payer: Self-pay | Admitting: *Deleted

## 2024-06-11 DIAGNOSIS — Z79899 Other long term (current) drug therapy: Secondary | ICD-10-CM

## 2024-06-11 DIAGNOSIS — Z9225 Personal history of immunosupression therapy: Secondary | ICD-10-CM

## 2024-06-11 DIAGNOSIS — Z111 Encounter for screening for respiratory tuberculosis: Secondary | ICD-10-CM | POA: Diagnosis not present

## 2024-06-11 DIAGNOSIS — H15003 Unspecified scleritis, bilateral: Secondary | ICD-10-CM

## 2024-06-15 ENCOUNTER — Ambulatory Visit (INDEPENDENT_AMBULATORY_CARE_PROVIDER_SITE_OTHER): Payer: Self-pay | Admitting: Internal Medicine

## 2024-06-15 ENCOUNTER — Encounter (INDEPENDENT_AMBULATORY_CARE_PROVIDER_SITE_OTHER): Payer: Self-pay | Admitting: Internal Medicine

## 2024-06-15 VITALS — BP 128/79 | HR 75 | Temp 98.7°F | Ht 63.0 in | Wt 152.0 lb

## 2024-06-15 DIAGNOSIS — R7303 Prediabetes: Secondary | ICD-10-CM

## 2024-06-15 DIAGNOSIS — Z6826 Body mass index (BMI) 26.0-26.9, adult: Secondary | ICD-10-CM

## 2024-06-15 DIAGNOSIS — I10 Essential (primary) hypertension: Secondary | ICD-10-CM

## 2024-06-15 DIAGNOSIS — Z6830 Body mass index (BMI) 30.0-30.9, adult: Secondary | ICD-10-CM

## 2024-06-15 DIAGNOSIS — E669 Obesity, unspecified: Secondary | ICD-10-CM

## 2024-06-15 LAB — CBC WITH DIFFERENTIAL/PLATELET
Absolute Lymphocytes: 2729 {cells}/uL (ref 850–3900)
Absolute Monocytes: 731 {cells}/uL (ref 200–950)
Basophils Absolute: 60 {cells}/uL (ref 0–200)
Basophils Relative: 0.7 %
Eosinophils Absolute: 68 {cells}/uL (ref 15–500)
Eosinophils Relative: 0.8 %
HCT: 39.6 % (ref 35.0–45.0)
Hemoglobin: 13.2 g/dL (ref 11.7–15.5)
MCH: 34.3 pg — ABNORMAL HIGH (ref 27.0–33.0)
MCHC: 33.3 g/dL (ref 32.0–36.0)
MCV: 102.9 fL — ABNORMAL HIGH (ref 80.0–100.0)
MPV: 9.7 fL (ref 7.5–12.5)
Monocytes Relative: 8.6 %
Neutro Abs: 4913 {cells}/uL (ref 1500–7800)
Neutrophils Relative %: 57.8 %
Platelets: 308 Thousand/uL (ref 140–400)
RBC: 3.85 Million/uL (ref 3.80–5.10)
RDW: 13.3 % (ref 11.0–15.0)
Total Lymphocyte: 32.1 %
WBC: 8.5 Thousand/uL (ref 3.8–10.8)

## 2024-06-15 LAB — QUANTIFERON-TB GOLD PLUS
Mitogen-NIL: >10 [IU]/mL
NIL: 0.01 [IU]/mL
QuantiFERON-TB Gold Plus: NEGATIVE
TB1-NIL: 0 [IU]/mL
TB2-NIL: 0 [IU]/mL

## 2024-06-15 LAB — COMPREHENSIVE METABOLIC PANEL WITH GFR
AG Ratio: 1.7 (calc) (ref 1.0–2.5)
ALT: 28 U/L (ref 6–29)
AST: 29 U/L (ref 10–35)
Albumin: 4.8 g/dL (ref 3.6–5.1)
Alkaline phosphatase (APISO): 47 U/L (ref 37–153)
BUN: 14 mg/dL (ref 7–25)
CO2: 29 mmol/L (ref 20–32)
Calcium: 9.9 mg/dL (ref 8.6–10.4)
Chloride: 106 mmol/L (ref 98–110)
Creat: 0.84 mg/dL (ref 0.50–1.05)
Globulin: 2.9 g/dL (ref 1.9–3.7)
Glucose, Bld: 61 mg/dL — ABNORMAL LOW (ref 65–99)
Potassium: 4 mmol/L (ref 3.5–5.3)
Sodium: 142 mmol/L (ref 135–146)
Total Bilirubin: 0.6 mg/dL (ref 0.2–1.2)
Total Protein: 7.7 g/dL (ref 6.1–8.1)
eGFR: 77 mL/min/1.73m2 (ref >=60)

## 2024-06-15 LAB — ADALIMUMAB DRUG LEVEL AND ANTI-DRUG AB FOR RHEUMATIC DISEASE
ADALIMUMAB AB,RHEUM: <10 [AU] (ref <10)
ADALIMUMAB LEVEL, RHEUM: 12.8 ug/mL

## 2024-06-15 LAB — SEDIMENTATION RATE: Sed Rate: 14 mm/h (ref 0–30)

## 2024-06-15 MED ORDER — ZEPBOUND 5 MG/0.5ML ~~LOC~~ SOAJ: 5.0000 mg | SUBCUTANEOUS | 0 refills | Status: AC

## 2024-06-15 NOTE — Progress Notes (Unsigned)
 Office: [phone]  /  Fax: [phone]  Weight Summary and Body Composition Analysis (BIA)  Vitals Temp: 98.7 F (37.1 C) BP: 128/79 Pulse Rate: 75 SpO2: 100 %   Anthropometric Measurements Height: 5' 3 (1.6 m) Weight: 152 lb (68.9 kg) BMI (Calculated): 26.93 Weight at Last Visit: 150 lb Weight Lost Since Last Visit: 0 lb Weight Gained Since Last Visit: 2 lb Starting Weight: 180 lb Total Weight Loss (lbs): 28 lb (12.7 kg) Peak Weight: 230 lb   Body Composition  Body Fat %: 31.5 % Fat Mass (lbs): 48.2 lbs Muscle Mass (lbs): 99.2 lbs Total Body Water (lbs): 69.6 lbs Visceral Fat Rating : 8    RMR: 1742  Today's Visit #: 21  Starting Date: 06/27/22   Subjective   Chief Complaint: Obesity  Interval History Discussed the use of AI scribe software for clinical note transcription with the patient, who gave verbal consent to proceed.  History of Present Illness Kari Williams is a 63 year old female who presents for medical weight management.  Since last office visit Kari Williams has gained 2 pounds.  Kari Williams is following a 1200-calorie target about 90% of the time Kari Williams is exercising 5 days a week 60 minutes doing cardio and strengthening.  Kari Williams is in the maintenance phase of her weight management plan and is using Zepbound , with coverage until February. Kari Williams is concerned about losing coverage for this medication starting in January. Kari Williams has noticed an increase in muscle mass, attributed to increased protein intake and changes in her physical activity routine, especially during colder months. Kari Williams maintains a caloric intake of around 1200 calories to sustain her current weight. Kari Williams tracks her caloric intake, aiming for a low-carb approach, and occasionally indulges in foods like [employer], planning her meals in advance to manage intake. Kari Williams previously used metformin  but experienced side effects when combined with Zepbound . Kari Williams is considering options for weight management  medications due to potential changes in insurance coverage.  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 The flare-ups have been severe, causing significant discomfort and sensitivity to sunlight, to the extent that Kari Williams describes herself as 'Kari Williams' due to her inability to go outside without discomfort. Kari Williams reports that her cornea feels as though it is sticking together, causing concern.  Kari Williams is preparing for her daughter's wedding in April.     Challenges affecting patient progress: none.    Pharmacotherapy for weight management: Kari Williams is currently taking Zepbound  with adequate clinical response , without side effects., and patient in the maintenance phase.   Assessment and Plan   Treatment Plan For Obesity:  Recommended Dietary Goals  Kari Williams is currently in the action stage of change. As such, her goal is to continue weight management plan. Kari Williams has agreed to: continue current plan  Behavioral Health and Counseling  We discussed the following behavioral modification strategies today: continue to work on maintaining a reduced calorie state, getting the recommended amount of protein, incorporating whole foods, making healthy choices, staying well hydrated and practicing mindfulness when eating. and increase protein intake, fibrous foods (25 grams per day for women, 30 grams for men) and water to improve satiety and decrease hunger signals. .  Additional education and resources provided today: None  Recommended Physical Activity Goals  Kari Williams has been advised to work up to 150 minutes of moderate intensity aerobic activity a week and strengthening exercises 2-3 times per week for cardiovascular health, weight loss maintenance and  preservation of muscle mass.  Kari Williams has agreed to :  Think about enjoyable ways to increase daily physical activity and overcoming barriers to exercise, Increase physical activity in their day and reduce sedentary  time (increase NEAT)., Increase volume of physical activity to a goal of 240 minutes a week, and Combine aerobic and strengthening exercises for efficiency and improved cardiometabolic health.  Medical Interventions and Pharmacotherapy  We discussed various medication options to help Kari Williams with her weight loss efforts and we both agreed to : Adequate clinical response to anti-obesity medication, continue current regimen and patient in the maintenance phase  Associated Conditions Impacted by Obesity Treatment  Assessment & Plan Prediabetes Her hemoglobin A1c from March of this year was 5.5 and improved from 6.4 in the past.  Kari Williams maintains a low-carb reduced calorie nutrition plan.  Kari Williams also has a fair amount of physical activity.  Kari Williams is also on Zepbound  and has lost approximately 22% of total body weight.  Kari Williams will continue with current weight management strategy inclusive of GLP-1 Generalized obesity with starting BMI 32 BMI 26.0-26.9,adult Weight: decrease of 44.2 lb (22.8%) over 3 years, 9 months  Start: 07/12/2020 194 lb 3.2 oz (88.1 kg)  End: 04/27/2024 150 lb (68 kg)  Currently on Zepbound  for weight management, with potential loss of coverage in January. Muscle mass has increased, indicating positive response to current regimen. Discussed alternative medications including Wegovy and Victoza, with considerations of cost and administration frequency. Emphasized importance of maintaining a calorie intake of approximately 1300 calories per day to maintain weight, with adjustments based on physical activity and body weight changes. Discussed potential transition to metformin  and Wellbutrin or metformin  and zonisamide if Zepbound  is discontinued. Highlighted the importance of periodic calorie tracking and physical activity monitoring to avoid plateaus. - Continue Zepbound  with three refills for three months. - Monitor calorie intake to maintain weight at approximately 1300 calories per day. -  Track physical activity and adjust as needed to avoid plateaus. - Will reassess medication options in January, considering pricing changes and coverage status. Primary hypertension  Blood pressure well-controlled.  Kari Williams is currently on amlodipine  10 mg a day and losartan 100 mg.  Continue current regimen.  Monitor for orthostasis while losing weight         Objective   Physical Exam:  Blood pressure 128/79, pulse 75, temperature 98.7 F (37.1 C), height 5' 3 (1.6 m), weight 152 lb (68.9 kg), SpO2 100%. Body mass index is 26.93 kg/m.  General: Kari Williams is overweight, cooperative, alert, well developed, and in no acute distress. PSYCH: Has normal mood, affect and thought process.   HEENT: EOMI, sclerae are anicteric. Lungs: Normal breathing effort, no conversational dyspnea. Extremities: No edema.  Neurologic: No gross sensory or motor deficits. No tremors or fasciculations noted.    Diagnostic Data Reviewed:  BMET    Component Value Date/Time   NA 142 06/11/2024 1404   NA 142 06/27/2022 1007   K 4.0 06/11/2024 1404   CL 106 06/11/2024 1404   CO2 29 06/11/2024 1404   GLUCOSE 61 (L) 06/11/2024 1404   BUN 14 06/11/2024 1404   BUN 13 06/27/2022 1007   CREATININE 0.84 06/11/2024 1404   CALCIUM 9.9 06/11/2024 1404   GFRNONAA 66 12/07/2020 1518   GFRAA 77 12/07/2020 1518   Lab Results  Component Value Date   HGBA1C 6.0 (H) 05/02/2023   HGBA1C 6.4 (H) 06/27/2022   Lab Results  Component Value Date   INSULIN  23.1 06/27/2022  Lab Results  Component Value Date   TSH 1.070 06/27/2022   CBC    Component Value Date/Time   WBC 8.5 06/11/2024 1404   RBC 3.85 06/11/2024 1404   HGB 13.2 06/11/2024 1404   HCT 39.6 06/11/2024 1404   PLT 308 06/11/2024 1404   MCV 102.9 (H) 06/11/2024 1404   MCH 34.3 (H) 06/11/2024 1404   MCHC 33.3 06/11/2024 1404   RDW 13.3 06/11/2024 1404   Iron Studies No results found for: IRON, TIBC, FERRITIN, IRONPCTSAT Lipid Panel      Component Value Date/Time   CHOL 234 (H) 06/27/2022 1007   TRIG 144 06/27/2022 1007   HDL 43 06/27/2022 1007   LDLCALC 165 (H) 06/27/2022 1007   Hepatic Function Panel     Component Value Date/Time   PROT 7.7 06/11/2024 1404   PROT 7.8 06/27/2022 1007   ALBUMIN 4.5 06/27/2022 1007   AST 29 06/11/2024 1404   ALT 28 06/11/2024 1404   ALKPHOS 88 06/27/2022 1007   BILITOT 0.6 06/11/2024 1404   BILITOT 0.5 06/27/2022 1007      Component Value Date/Time   TSH 1.070 06/27/2022 1007   Nutritional Lab Results  Component Value Date   VD25OH 51.7 10/18/2022   VD25OH 11.4 (L) 06/27/2022    Medications: Outpatient Encounter Medications as of 06/15/2024  Medication Sig   Adalimumab -ryvk, 2 Pen, 40 MG/0.4ML AJKT INJECT 40 MG UNDER THE SKIN EVERY 14 DAYS   amLODipine  (NORVASC ) 10 MG tablet Take 10 mg by mouth daily.   DULoxetine (CYMBALTA) 30 MG capsule Take 30 mg by mouth daily.   folic acid  (FOLVITE ) 1 MG tablet TAKE 2 TABLETS DAILY   losartan (COZAAR) 100 MG tablet Take 100 mg by mouth daily.   methotrexate  (RHEUMATREX) 2.5 MG tablet TAKE 4 TABLETS ONCE A WEEK. CAUTION CHEMOTHERAPY. PROTECT FROM LIGHT (Patient taking differently: Take 15 mg by mouth once a week.)   Multiple Vitamins-Minerals (MULTIVITAMIN WITH MINERALS) tablet Take 1 tablet by mouth daily.   prednisoLONE acetate (PRED FORTE) 1 % ophthalmic suspension 1 drop 4 (four) times daily.   VITAMIN D  PO Take by mouth.   [DISCONTINUED] tirzepatide  (ZEPBOUND ) 5 MG/0.5ML Pen Inject 5 mg into the skin once a week.   tirzepatide  (ZEPBOUND ) 5 MG/0.5ML Pen Inject 5 mg into the skin once a week.   [DISCONTINUED] ondansetron  (ZOFRAN -ODT) 4 MG disintegrating tablet Take 1 tablet (4 mg total) by mouth every 8 (eight) hours as needed for nausea or vomiting. (Patient not taking: Reported on 06/01/2024)   No facility-administered encounter medications on file as of 06/15/2024.     Follow-Up   Return in about 8 weeks (around 08/10/2024)  for For Weight Mangement with Dr. Francyne.SABRA Kari Williams was informed of the importance of frequent follow up visits to maximize her success with intensive lifestyle modifications for her multiple health conditions.  Attestation Statement   Reviewed by clinician on day of visit: allergies, medications, problem list, medical history, surgical history, family history, social history, and previous encounter notes.     Lucas Francyne, MD

## 2024-06-16 NOTE — Assessment & Plan Note (Signed)
 Weight: decrease of 44.2 lb (22.8%) over 3 years, 9 months  Start: 07/12/2020 194 lb 3.2 oz (88.1 kg)  End: 04/27/2024 150 lb (68 kg)  Currently on Zepbound  for weight management, with potential loss of coverage in January. Muscle mass has increased, indicating positive response to current regimen. Discussed alternative medications including Wegovy and Victoza, with considerations of cost and administration frequency. Emphasized importance of maintaining a calorie intake of approximately 1300 calories per day to maintain weight, with adjustments based on physical activity and body weight changes. Discussed potential transition to metformin  and Wellbutrin or metformin  and zonisamide if Zepbound  is discontinued. Highlighted the importance of periodic calorie tracking and physical activity monitoring to avoid plateaus. - Continue Zepbound  with three refills for three months. - Monitor calorie intake to maintain weight at approximately 1300 calories per day. - Track physical activity and adjust as needed to avoid plateaus. - Will reassess medication options in January, considering pricing changes and coverage status.

## 2024-06-16 NOTE — Assessment & Plan Note (Signed)
 Her hemoglobin A1c from March of this year was 5.5 and improved from 6.4 in the past.  She maintains a low-carb reduced calorie nutrition plan.  She also has a fair amount of physical activity.  She is also on Zepbound  and has lost approximately 22% of total body weight.  She will continue with current weight management strategy inclusive of GLP-1

## 2024-06-16 NOTE — Assessment & Plan Note (Signed)
 Blood pressure well-controlled.  She is currently on amlodipine 10 mg a day and losartan 100 mg.  Continue current regimen.  Monitor for orthostasis while losing weight

## 2024-06-23 ENCOUNTER — Other Ambulatory Visit: Payer: Self-pay

## 2024-06-23 NOTE — Telephone Encounter (Addendum)
 Refill request received via fax from [facility] for Simlandi   Last Fill: 06/08/2024  Labs: 06/11/2024 Glucose 61 MCV 102.9 MCH 34.3 Rest of CBC and CMP WNL    TB Gold: 06/11/2024 TB Gold Negative    Next Visit: 09/01/2024  Last Visit: 08/02/2023  DX:Bilateral scleritis   Current Dose per office note 06/01/2024: Simlandi  40 mg subcu q. 14 days   Okay to refill Simlandi ?

## 2024-06-24 ENCOUNTER — Ambulatory Visit: Payer: Self-pay | Admitting: Internal Medicine

## 2024-06-24 MED ORDER — ADALIMUMAB-RYVK (2 PEN) 40 MG/0.4ML ~~LOC~~ AJKT: 40.0000 mg | AUTO-INJECTOR | SUBCUTANEOUS | 2 refills | Status: AC

## 2024-06-24 NOTE — Progress Notes (Signed)
 Lab results look good. Her blood count and metabolic panel are normal. MCV of 102 is consistent with methotrexate  use. Fasting blood sugar was slightly low at 61. Her test showed no antibody against the simlandi  and that the dose is in the correct range. So I recommend we stick with the current treatment dose for now while increasing the methotrexate  and monitor for any new flare ups.

## 2024-07-17 DIAGNOSIS — H15002 Unspecified scleritis, left eye: Secondary | ICD-10-CM | POA: Diagnosis not present

## 2024-07-17 DIAGNOSIS — H2513 Age-related nuclear cataract, bilateral: Secondary | ICD-10-CM | POA: Diagnosis not present

## 2024-07-17 DIAGNOSIS — H35033 Hypertensive retinopathy, bilateral: Secondary | ICD-10-CM | POA: Diagnosis not present

## 2024-07-17 DIAGNOSIS — H35371 Puckering of macula, right eye: Secondary | ICD-10-CM | POA: Diagnosis not present

## 2024-08-10 ENCOUNTER — Ambulatory Visit (INDEPENDENT_AMBULATORY_CARE_PROVIDER_SITE_OTHER): Admitting: Internal Medicine

## 2024-08-10 ENCOUNTER — Encounter (INDEPENDENT_AMBULATORY_CARE_PROVIDER_SITE_OTHER): Payer: Self-pay | Admitting: Internal Medicine

## 2024-08-10 VITALS — BP 131/78 | HR 80 | Temp 97.9°F | Ht 63.0 in | Wt 153.0 lb

## 2024-08-10 DIAGNOSIS — I10 Essential (primary) hypertension: Secondary | ICD-10-CM

## 2024-08-10 DIAGNOSIS — Z6827 Body mass index (BMI) 27.0-27.9, adult: Secondary | ICD-10-CM | POA: Diagnosis not present

## 2024-08-10 DIAGNOSIS — E669 Obesity, unspecified: Secondary | ICD-10-CM | POA: Diagnosis not present

## 2024-08-10 DIAGNOSIS — R7303 Prediabetes: Secondary | ICD-10-CM

## 2024-08-10 NOTE — Assessment & Plan Note (Signed)
 Weight: decrease of 30 lb (16.4%) over 2 years, 1 month  Start: 06/19/2022 183 lb (83 kg)  End: 08/10/2024 153 lb (69.4 kg

## 2024-08-10 NOTE — Progress Notes (Signed)
 "  Office: [phone]  /  Fax: [phone]  Weight Summary and Body Composition Analysis (BIA)  Vitals Temp: 97.9 F (36.6 C) BP: 131/78 Pulse Rate: 80 SpO2: 100 %   Anthropometric Measurements Height: 5' 3 (1.6 m) Weight: 153 lb (69.4 kg) BMI (Calculated): 27.11 Weight at Last Visit: 152 lb Weight Lost Since Last Visit: 0 lb Weight Gained Since Last Visit: 1 lb Starting Weight: 180 lb Total Weight Loss (lbs): 27 lb (12.2 kg) Peak Weight: 230 lb   Body Composition  Body Fat %: 30.8 % Fat Mass (lbs): 47.2 lbs Muscle Mass (lbs): 100.4 lbs Total Body Water (lbs): 67.6 lbs Visceral Fat Rating : 8    RMR: 1742  Today's Visit #: 22  Starting Date: 06/27/22   Subjective   Chief Complaint: Obesity  Interval History  Discussed the use of AI scribe software for clinical note transcription with the patient, who gave verbal consent to proceed.  History of Present Illness Kari Williams is a 64 year old female with obesity and prediabetes who presents for weight management.   Kari Williams has been using Zepbound , a GLP-1 receptor agonist, for weight loss, resulting in a 30-pound reduction over two years. The medication aids in portion control and hunger reduction. Kari Williams is concerned about potential discontinuation due to insurance issues and is exploring alternatives. Kari Williams maintains a balanced diet and exercises regularly, though Kari Williams plans to adjust her routine due to increased gym attendance in January.  Her prediabetes, initially marked by an A1c of 6.4, has improved to 5.5, reducing her diabetes risk.  Kari Williams has a history of high blood pressure, which Kari Williams manages with medication.  Kari Williams experiences flare-ups of scleritis, worsening recently and causing increased cataract symptoms in her right eye, described as 'cloudy' and 'foggy'. Kari Williams is scheduled for an ophthalmologist visit in February to discuss cataract surgery. Kari Williams uses Meibo eye drops for dryness associated with  scleritis.  Kari Williams lives alone, manages her meals with a focus on portion control and balanced nutrition. No significant changes in hunger levels, but Kari Williams notes decreased fullness after meals.     Challenges affecting patient progress: having difficulties with GLP-1 or AOM coverage.    Pharmacotherapy for weight management: Kari Williams is currently taking Zepbound  with adequate clinical response  and without side effects..   Assessment and Plan   Treatment Plan For Obesity:  Recommended Dietary Goals  Kari Williams is currently in the action stage of change. As such, her goal is to continue weight management plan. Kari Williams has agreed to: continue current plan  Behavioral Health and Counseling  We discussed the following behavioral modification strategies today: continue to work on maintaining a reduced calorie state, getting the recommended amount of protein, incorporating whole foods, making healthy choices, staying well hydrated and practicing mindfulness when eating. and increase protein intake, fibrous foods (25 grams per day for women, 30 grams for men) and water to improve satiety and decrease hunger signals. .  Additional education and resources provided today: None  Recommended Physical Activity Goals  Kari Williams has been advised to work up to 150 minutes of moderate intensity aerobic activity a week and strengthening exercises 2-3 times per week for cardiovascular health, weight loss maintenance and preservation of muscle mass.  Kari Williams has agreed to :  Increase and monitor steps for a goal of 10,000 per day, Increase volume of physical activity to a goal of 240 minutes a week, and Combine aerobic and strengthening exercises for efficiency and improved cardiometabolic health.  Medical  Interventions and Pharmacotherapy  We discussed various medication options to help Kari Williams with her weight loss efforts and we both agreed to : Adequate clinical response to anti-obesity medication, continue current  anti-obesity regimen  Associated Conditions Impacted by Obesity Treatment  Assessment & Plan Primary hypertension  Generalized obesity with starting BMI 32 Weight: decrease of 30 lb (16.4%) over 2 years, 1 month  Start: 06/19/2022 183 lb (83 kg)  End: 08/10/2024 153 lb (69.4 kg Prediabetes     Assessment and Plan Assessment & Plan Obesity Management with GLP-1 receptor agonist (Zepbound ) has been effective, resulting in a 30-pound weight loss over two years. Current BMI is 27. Concerns about insurance coverage for Zepbound  and potential weight regain if medication is discontinued. Discussed alternative options including Wegovy and lifestyle modifications. Emphasized the importance of portion control and dietary management. Discussed the potential for weight regain in 80% of individuals after discontinuation of GLP-1 receptor agonists, with 20% maintaining weight loss. - Education officer, environmental to check insurance coverage for Zepbound  and [id number]. - Consider transitioning to [facility] if Zepbound  is not covered. - Continue portion control and dietary management. - Will schedule follow-up appointment in mid-February to discuss medication options.  Prediabetes Significant improvement in A1c from 6.4% to 5.5% with weight loss and GLP-1 receptor agonist therapy. Discussed the importance of maintaining weight loss to prevent progression to diabetes. Consideration of alternative medications if GLP-1 receptor agonist is discontinued. - Continue monitoring A1c levels. - Will consider alternative medications if GLP-1 receptor agonist is discontinued.  Hypertension Managed with medication. Changes in management discussed during this visit, including consideration of transitioning from Zepbound  to [facility] due to insurance coverage issues. - Continue current hypertension management.  Recording duration: 25 minutes  )      Objective   Physical Exam:  Blood pressure 131/78, pulse 80,  temperature 97.9 F (36.6 C), height 5' 3 (1.6 m), weight 153 lb (69.4 kg), SpO2 100%. Body mass index is 27.1 kg/m.  General: Kari Williams is overweight, cooperative, alert, well developed, and in no acute distress. PSYCH: Has normal mood, affect and thought process.   HEENT: EOMI, sclerae are anicteric. Lungs: Normal breathing effort, no conversational dyspnea. Extremities: No edema.  Neurologic: No gross sensory or motor deficits. No tremors or fasciculations noted.    Diagnostic Data Reviewed:  BMET    Component Value Date/Time   NA 142 06/11/2024 1404   NA 142 06/27/2022 1007   K 4.0 06/11/2024 1404   CL 106 06/11/2024 1404   CO2 29 06/11/2024 1404   GLUCOSE 61 (L) 06/11/2024 1404   BUN 14 06/11/2024 1404   BUN 13 06/27/2022 1007   CREATININE 0.84 06/11/2024 1404   CALCIUM 9.9 06/11/2024 1404   GFRNONAA 66 12/07/2020 1518   GFRAA 77 12/07/2020 1518   Lab Results  Component Value Date   HGBA1C 6.0 (H) 05/02/2023   HGBA1C 6.4 (H) 06/27/2022   Lab Results  Component Value Date   INSULIN  23.1 06/27/2022   Lab Results  Component Value Date   TSH 1.070 06/27/2022   CBC    Component Value Date/Time   WBC 8.5 06/11/2024 1404   RBC 3.85 06/11/2024 1404   HGB 13.2 06/11/2024 1404   HCT 39.6 06/11/2024 1404   PLT 308 06/11/2024 1404   MCV 102.9 (H) 06/11/2024 1404   MCH 34.3 (H) 06/11/2024 1404   MCHC 33.3 06/11/2024 1404   RDW 13.3 06/11/2024 1404   Iron Studies No results found for: IRON, TIBC, FERRITIN, IRONPCTSAT  Lipid Panel     Component Value Date/Time   CHOL 234 (H) 06/27/2022 1007   TRIG 144 06/27/2022 1007   HDL 43 06/27/2022 1007   LDLCALC 165 (H) 06/27/2022 1007   Hepatic Function Panel     Component Value Date/Time   PROT 7.7 06/11/2024 1404   PROT 7.8 06/27/2022 1007   ALBUMIN 4.5 06/27/2022 1007   AST 29 06/11/2024 1404   ALT 28 06/11/2024 1404   ALKPHOS 88 06/27/2022 1007   BILITOT 0.6 06/11/2024 1404   BILITOT 0.5 06/27/2022 1007       Component Value Date/Time   TSH 1.070 06/27/2022 1007   Nutritional Lab Results  Component Value Date   VD25OH 51.7 10/18/2022   VD25OH 11.4 (L) 06/27/2022    Medications: Outpatient Encounter Medications as of 08/10/2024  Medication Sig   Adalimumab -ryvk, 2 Pen, 40 MG/0.4ML AJKT Inject 40 mg into the skin every 14 (fourteen) days.   amLODipine  (NORVASC ) 10 MG tablet Take 10 mg by mouth daily.   DULoxetine (CYMBALTA) 30 MG capsule Take 30 mg by mouth daily.   folic acid  (FOLVITE ) 1 MG tablet TAKE 2 TABLETS DAILY   losartan (COZAAR) 100 MG tablet Take 100 mg by mouth daily.   methotrexate  (RHEUMATREX) 2.5 MG tablet TAKE 4 TABLETS ONCE A WEEK. CAUTION CHEMOTHERAPY. PROTECT FROM LIGHT (Patient taking differently: Take 15 mg by mouth once a week.)   Multiple Vitamins-Minerals (MULTIVITAMIN WITH MINERALS) tablet Take 1 tablet by mouth daily.   prednisoLONE acetate (PRED FORTE) 1 % ophthalmic suspension 1 drop 4 (four) times daily.   tirzepatide  (ZEPBOUND ) 5 MG/0.5ML Pen Inject 5 mg into the skin once a week.   VITAMIN D  PO Take by mouth.   No facility-administered encounter medications on file as of 08/10/2024.     Follow-Up   No follow-ups on file.Kari Williams Kari Williams was informed of the importance of frequent follow up visits to maximize her success with intensive lifestyle modifications for her multiple health conditions.  Attestation Statement   Reviewed by clinician on day of visit: allergies, medications, problem list, medical history, surgical history, family history, social history, and previous encounter notes.     Lucas Parker, MD   "

## 2024-08-13 ENCOUNTER — Encounter (INDEPENDENT_AMBULATORY_CARE_PROVIDER_SITE_OTHER): Payer: Self-pay | Admitting: Internal Medicine

## 2024-08-21 NOTE — Assessment & Plan Note (Addendum)
" °  Orders:   Sedimentation rate  "

## 2024-08-21 NOTE — Progress Notes (Unsigned)
 "  Office Visit Note  Patient: Kari Williams             Date of Birth: 31-Jan-1961           MRN: [id number]             PCP: Rexanne Ingle, MD Referring: Rexanne Ingle, MD Visit Date: 09/01/2024   Subjective:  No chief complaint on file.   History of Present Illness: Kari Williams is a 64 y.o. female here for follow up for uveitis and scleritis on adalimumab  40 mg subcu q. 14 days and methotrexate  15 mg p.o. weekly and folic acid  2 mg daily.    Some numbnesson right 3rd-5th fingers but not swelling and not reproducible ***  Previous HPI 06/01/2024 Kari Williams is a 64 y.o. female here for follow up for uveitis and scleritis on adalimumab  40 mg subcu q. 14 days and methotrexate  15 mg p.o. weekly and folic acid  2 mg daily.  She presents with recurrent eye redness and Williams.   Her symptoms began on September 1st, coinciding with her husband's birthday, when she experienced difficulty seeing and eye Williams. Initially, the symptoms subsided slightly but worsened around September 21st, leading to significant redness and Williams that prevented her from going outside. By September 23rd, she sought medical attention due to the severity of her symptoms.   When her eyes become inflamed and red, she experiences blurriness upon waking, which typically resolves by noon. Recently, she experienced another episode of redness and sharp Williams over the past weekend, but without the associated blurriness. Dr. Maree recommended increase of methotrexate  back to 15 mg weekly dose due to disease relapse.   She is currently using eye drops four times a day and her methotrexate  dosage was increased from four to six pills per week on September 23rd. She continues her [place]  injections without interruption, taking her last dose this past Thursday, with the next dose due in a week and a half.     Previous HPI 02/26/2024 Kari Williams is a 64 y.o. female here for follow up for uveitis and  scleritis on adalimumab  40 mg subcu q. 14 days and methotrexate  10 mg p.o. weekly and folic acid  2 mg daily.  She also presents with shoulder Williams and with a history of a 90% rotator cuff tear who.   She has been experiencing persistent shoulder Williams for about a week, described as a constant, nagging sensation across the collarbone and into the neck area. Initially managed with 400 mg of ibuprofen  twice daily, she increased the dose to 800 mg due to persistent discomfort. She is concerned about potential gastrointestinal side effects of ibuprofen .   Five years ago, she was diagnosed with a 90% rotator cuff tear confirmed by imaging. She opted for physical therapy and exercises instead of surgery, which helped regain some mobility and reduce Williams. Symptoms had been going well until this year with persistent Williams and stifness now. She has done several exercises for her shoulder without much relief for the past 3 months.   She has not received any injections in the shoulder but has had a neck injection for unrelated neck issues. She uses a heating pad and ice pad to manage the Williams and performs shoulder exercises to maintain mobility.   No vision changes, eye Williams, or redness. She uses a heated eye mask at night to aid sleep.         Previous HPI 11/22/2023 Kari Williams is a  64 y.o. female here for follow up for uveitis and scleritis on adalimumab  40 mg subcu q. 14 days and methotrexate  10 mg p.o. weekly and folic acid  2 mg daily.   She has been managing her prediabetes with Zepbound  for over a year, resulting in an improvement in her A1c levels from 6 to 5. She has also lost 14 pounds since her last visit, attributing this to dietary changes and medication, which have improved her satiety recognition and aided in weight management.   She is currently taking methotrexate , with a dosage of four tablets on Thursdays, for her rheumatoid arthritis. She is dissatisfied with the biosimilar  medication's delivery system, which is dictated by her insurance. Her eyes have been less dry since incorporating heated eye pads into her nightly routine for relaxation.   Approximately three weeks ago, she had the flu, confirmed by a test at a walk-in clinic. Her symptoms included headache, cough, and a scratchy throat. She was prescribed Tamiflu and a nasal spray, which she used three times a day. She still has a lingering cough and has been using glycerin pills for relief.   She has a history of shoulder issues, specifically with her rotator cuff, causing some nagging discomfort but not significant Williams. Her range of motion is good, and she can perform daily activities without much limitation. Surgery has been advised against due to the manageable nature of her symptoms.         Previous HPI 05/02/23  Kari Williams is a 64 y.o. 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 14 days and methotrexate  12.5 mg p.o. weekly and folic acid  2 mg daily.  No new changes with vision or episodes of eye redness or Williams.  No new joint Williams complaints she has partial improvement with the previous right hip and gluteal area tenderness and stiffness she has been working on this with exercise.  Is also working with healthy weight and wellness clinic has not had much weight change.  She discussed starting Zepbound  with her PCP she is currently on metformin  twice daily.  Has a new problem especially for the past few weeks now with dizziness first thing in the mornings gets better when she is up and moving for a while and after breakfast.   Previous HPI 01/24/2023 Kari Williams is a 64 y.o. 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 14 days and methotrexate  12.5 mg p.o. weekly and folic acid  2 mg daily.  Most recent liver enzyme tests from last visit were elevated but mildly so I will trend for monitoring methotrexate .  No new eye Williams or visual  change complaints.  Has been having intermittent Williams at the right hip comes and goes.  Often gets worse moving after sitting in a prolonged fixed position or direct pressure such as lying on the right side.  Also having Williams in the neck had an MRI for this that demonstrated a bulging disc.  She is scheduled for steroid injection next Friday for this.   Previous HPI 10/11/22 MARDY HOPPE is a 64 y.o. female here for follow up follow-up for uveitis and scleritis on Humira  40 mg subcu q. 14 days methotrexate  12.5 mg p.o. weekly and folic acid  2 mg daily.  Overall she is doing pretty well since last visit still gets some knee Williams without a lot of swelling.  Eye dryness and mild irritation no serious  Williams inflammation or visual acuity changes.  Most recent ophthalmology exam looked good.  She had recent labs obtained earlier this month with a normal complete blood count metabolic panel showed mild AST elevation to 57.     Previous HPI 06/19/22 LEASIA SWANN is a 64 y.o. female here for follow up for uveitis and scleritis on Humira  40 mg [place] q14days and MTX 12.5 mg PO weekly and folic acid  2 mg daily. After last visit right knee Williams improved and doing well now. She had follow up with Dr. Maree eye exam negative for active uveitis although had some dryness and surface irritation. Getting restasis approved for this. She also has migraines which were a problem I the past but had been free of these for years until recently.    Previous HPI 03/15/22 NESSIE NONG is a 64 y.o. 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 She had a UTI in June treated with nitrofurantoin then switched to cefuroxime based on susceptibilities and symptoms resolved.  Overall she is doing well no symptomatic complaints with eye irritation or vision changes.  She has some right knee Williams and stiffness does not severely limit her mobility.  Symptoms are most noticeable with certain movements such as descending  stairs.     Previous HPI 12/13/2021 Kari Williams is a 64 y.o. female here for follow up for uveiitis and scleritis on methotrexate  12.5 mg PO weekly and Humira  [place] q14days. Since our last visit her finger Williams is improved not needing any additional NSAIDs for symptom management. She has floaters but no new visual complaint no eye redness or Williams.   Previous HPI 09/13/2021 Kari Williams is a 64 y.o. female here for follow up for uveiitis and scleritis on methotrexate  15 mg PO weekly and Humira  [place] q14days. Last visit with Dr. Maree 08/01/21. Eyes are doing okay without new complaints and exam was okay. She has noticed some increased Williams and swelling affecting left hand fingers also mildly tender nodule swelling behind her left ear.   Previous HPI 06/14/21 ASTER SCREWS is a 64 y.o. female here for follow up for uveitis and scleritis on methotrexate  15 mg PO weekly and Humira  40 mg  q14days. She has experienced small painless skin rashes in a few places including the left elbow these improved with conservative treatment moisturizing. She has left calf cramps at night ongoing no problems during the day or with mobility. Bilateral eye dryness is slightly worse than before during day time, using lubricating eye drops more than before. She has future appointment with Dr. Maree scheduled for next week.   Previous HPI 06/07/20 Kari Williams is a 64 y.o. 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 She has a previous history of treatment with methotrexate  for iritis in 2010-2015 with Dr. Cleatus and Dr. Everlean. She had been off any treatment but visit with Dr. 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  Since earlier this year she has experienced eye redness watering and blurry vision.  She is more symptomatic complaint on the right than on the left currently but both sides bother her.  Besides the eye complaints  she does also report some fairly diffuse arthralgias and morning stiffness up to 30 minutes.  She has not noticed any specific joint swelling warmth or erythema.  In the past she has also been treated with oral or ocular steroids but has not received any for  the current relapse of symptoms.     Review of Systems  Constitutional:  Negative for fatigue.  HENT:  Negative for mouth sores and mouth dryness.   Eyes:  Positive for dryness.  Respiratory:  Negative for shortness of breath.   Cardiovascular:  Negative for chest Williams and palpitations.  Gastrointestinal:  Negative for blood in stool, constipation and diarrhea.  Endocrine: Negative for increased urination.  Genitourinary:  Negative for involuntary urination.  Musculoskeletal:  Positive for gait problem and morning stiffness. Negative for joint Williams, joint Williams, joint swelling, myalgias, muscle weakness, muscle tenderness and myalgias.  Skin:  Negative for color change, rash, hair loss and sensitivity to sunlight.  Allergic/Immunologic: Negative for susceptible to infections.  Neurological:  Negative for dizziness and headaches.  Hematological:  Negative for swollen glands.  Psychiatric/Behavioral:  Positive for depressed mood and sleep disturbance. The patient is nervous/anxious.     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   High risk medication use 06/07/2020   Carpal tunnel  syndrome 11/29/2015   Hypertensive retinopathy of both eyes 11/01/2015   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   Nuclear sclerotic cataract of both eyes 10/13/2015   Epiretinal membrane (ERM) of right eye 10/13/2015   Lumbosacral radiculitis 09/01/2014   Encounter for routine gynecological examination 11/19/2011   Generalized abdominal or pelvic swelling or mass or lump 11/19/2011   BMI 26.0-26.9,adult 11/19/2011   Fibroids 11/19/2011   HTN (hypertension) 11/19/2011   Condyloma 11/19/2011    Past Medical History:  Diagnosis Date   Allergy    seasonal   Anxiety    Arthritis    osteoarthritis/ddd   Bulging of cervical intervertebral disc    Chest Williams    Depression    Female infertility    Hypertension    Rheumatoid arthritis (HCC)    Uveitis     Family History  Problem Relation Age of Onset   Dementia Mother    Hypotension Mother    Stroke Father    Hypertension Father    Sudden death Father    Breast cancer Sister    Hypertension Sister    Thyroid  disease Sister    Colon cancer Brother    Sleep apnea Brother    Stroke Brother    Lupus Paternal Aunt    Lupus Niece    Past Surgical History:  Procedure Laterality Date   ABDOMINAL HYSTERECTOMY     CARPAL TUNNEL RELEASE  07/30/2000   right   COLONOSCOPY     LUMBAR DISC SURGERY  07/30/2004   LUMBAR FUSION  07/30/2009   THORACIC DISC SURGERY  07/30/2000   TUBAL LIGATION  07/02/2012   Procedure: BILATERAL TUBAL LIGATION;  Surgeon: Rome Rigg, MD;  Location: [facility];  Service: Gynecology;  Laterality: Bilateral;   UPPER GASTROINTESTINAL ENDOSCOPY     VAGINAL HYSTERECTOMY  07/02/2012   Procedure: HYSTERECTOMY VAGINAL;  Surgeon: Rome Rigg, MD;  Location: [facility];  Service: Gynecology;  Laterality: N/A;   Social History   Social History Narrative   Not on file   Immunization History  Administered Date(s) Administered   Influenza Split 07/03/2012   Influenza, Seasonal, Injecte, Preservative Fre  05/28/2023   PFIZER(Purple Top)SARS-COV-2 Vaccination 10/13/2019, 11/03/2019     Objective: Vital Signs: BP 125/81   Pulse 77   Temp (!) 96.5 F (35.8 C)   Resp 16   Ht 5' 3 (1.6 m)   Wt 157 lb 8 oz (71.4 kg)   BMI 27.90 kg/m    Physical Exam   Musculoskeletal Exam: ***  Exam good  Investigation: No additional findings.  Imaging: No results found.  Recent Labs: Lab Results  Component Value Date   WBC 8.5 06/11/2024   HGB 13.2 06/11/2024   PLT 308 06/11/2024   NA 142 06/11/2024   K 4.0 06/11/2024   CL 106 06/11/2024   CO2 29 06/11/2024   GLUCOSE 61 (L) 06/11/2024   BUN 14 06/11/2024   CREATININE 0.84 06/11/2024   BILITOT 0.6 06/11/2024   ALKPHOS 88 06/27/2022   AST 29 06/11/2024   ALT 28 06/11/2024   PROT 7.7 06/11/2024   ALBUMIN 4.5 06/27/2022   CALCIUM 9.9 06/11/2024   GFRAA 77 12/07/2020   QFTBGOLDPLUS NEGATIVE 06/11/2024    Speciality Comments: No specialty comments available.  Procedures:  No procedures performed Allergies: Penicillins   Assessment / Plan:     Visit Diagnoses:  Assessment & Plan Bilateral scleritis  Orders:   Sedimentation rate  High risk medication use  Orders:   CBC with Differential/Platelet   Comprehensive metabolic panel with GFR  Chronic right shoulder Williams      ***  Follow-Up Instructions: Return in about 3 months (around 11/29/2024) for RA on ADA/GC f/u 3mos.   Lonni LELON Ester, MD  Note - This record has been created using [employer].  Chart creation errors have been sought, but may not always  have been located. Such creation errors do not reflect on  the standard of medical care. "

## 2024-08-21 NOTE — Assessment & Plan Note (Addendum)
 SABRA

## 2024-08-21 NOTE — Assessment & Plan Note (Addendum)
  Orders:   CBC with Differential/Platelet   Comprehensive metabolic panel with GFR

## 2024-09-01 ENCOUNTER — Ambulatory Visit: Admitting: Internal Medicine

## 2024-09-01 ENCOUNTER — Encounter: Payer: Self-pay | Admitting: Internal Medicine

## 2024-09-01 VITALS — BP 125/81 | HR 77 | Temp 96.5°F | Resp 16 | Ht 63.0 in | Wt 157.5 lb

## 2024-09-01 DIAGNOSIS — G8929 Other chronic pain: Secondary | ICD-10-CM

## 2024-09-01 DIAGNOSIS — Z79899 Other long term (current) drug therapy: Secondary | ICD-10-CM

## 2024-09-01 DIAGNOSIS — H15003 Unspecified scleritis, bilateral: Secondary | ICD-10-CM

## 2024-09-02 LAB — COMPREHENSIVE METABOLIC PANEL WITH GFR
AG Ratio: 1.6 (calc) (ref 1.0–2.5)
ALT: 34 U/L — ABNORMAL HIGH (ref 6–29)
AST: 31 U/L (ref 10–35)
Albumin: 4.7 g/dL (ref 3.6–5.1)
Alkaline phosphatase (APISO): 54 U/L (ref 37–153)
BUN: 13 mg/dL (ref 7–25)
CO2: 28 mmol/L (ref 20–32)
Calcium: 9.8 mg/dL (ref 8.6–10.4)
Chloride: 105 mmol/L (ref 98–110)
Creat: 0.82 mg/dL (ref 0.50–1.05)
Globulin: 2.9 g/dL (ref 1.9–3.7)
Glucose, Bld: 74 mg/dL (ref 65–99)
Potassium: 4.1 mmol/L (ref 3.5–5.3)
Sodium: 141 mmol/L (ref 135–146)
Total Bilirubin: 0.5 mg/dL (ref 0.2–1.2)
Total Protein: 7.6 g/dL (ref 6.1–8.1)
eGFR: 79 mL/min/{1.73_m2}

## 2024-09-02 LAB — CBC WITH DIFFERENTIAL/PLATELET
Absolute Lymphocytes: 3182 {cells}/uL (ref 850–3900)
Absolute Monocytes: 574 {cells}/uL (ref 200–950)
Basophils Absolute: 82 {cells}/uL (ref 0–200)
Basophils Relative: 1 %
Eosinophils Absolute: 82 {cells}/uL (ref 15–500)
Eosinophils Relative: 1 %
HCT: 37.1 % (ref 35.9–46.0)
Hemoglobin: 12.5 g/dL (ref 11.7–15.5)
MCH: 33.9 pg — ABNORMAL HIGH (ref 27.0–33.0)
MCHC: 33.7 g/dL (ref 31.6–35.4)
MCV: 100.5 fL (ref 81.4–101.7)
MPV: 9.5 fL (ref 7.5–12.5)
Monocytes Relative: 7 %
Neutro Abs: 4280 {cells}/uL (ref 1500–7800)
Neutrophils Relative %: 52.2 %
Platelets: 321 10*3/uL (ref 140–400)
RBC: 3.69 Million/uL — ABNORMAL LOW (ref 3.80–5.10)
RDW: 12.8 % (ref 11.0–15.0)
Total Lymphocyte: 38.8 %
WBC: 8.2 10*3/uL (ref 3.8–10.8)

## 2024-09-02 LAB — SEDIMENTATION RATE: Sed Rate: 9 mm/h (ref 0–30)

## 2024-09-14 ENCOUNTER — Ambulatory Visit (INDEPENDENT_AMBULATORY_CARE_PROVIDER_SITE_OTHER): Admitting: Internal Medicine

## 2024-11-30 ENCOUNTER — Ambulatory Visit: Admitting: Internal Medicine
